# Patient Record
Sex: Female | Born: 1935 | Race: White | Hispanic: No | Smoking: Former smoker
Health system: Southern US, Community
[De-identification: ages and names within clinical notes are randomized; demographics above are authoritative.]

## PROBLEM LIST (undated history)

## (undated) DIAGNOSIS — I82409 Acute embolism and thrombosis of unspecified deep veins of unspecified lower extremity: Secondary | ICD-10-CM

## (undated) DIAGNOSIS — I10 Essential (primary) hypertension: Secondary | ICD-10-CM

## (undated) DIAGNOSIS — K449 Diaphragmatic hernia without obstruction or gangrene: Secondary | ICD-10-CM

## (undated) DIAGNOSIS — Z9981 Dependence on supplemental oxygen: Secondary | ICD-10-CM

## (undated) DIAGNOSIS — J849 Interstitial pulmonary disease, unspecified: Secondary | ICD-10-CM

## (undated) DIAGNOSIS — N813 Complete uterovaginal prolapse: Secondary | ICD-10-CM

## (undated) DIAGNOSIS — J45909 Unspecified asthma, uncomplicated: Secondary | ICD-10-CM

## (undated) DIAGNOSIS — R0600 Dyspnea, unspecified: Secondary | ICD-10-CM

## (undated) DIAGNOSIS — C801 Malignant (primary) neoplasm, unspecified: Secondary | ICD-10-CM

## (undated) DIAGNOSIS — K2289 Other specified disease of esophagus: Secondary | ICD-10-CM

## (undated) DIAGNOSIS — J449 Chronic obstructive pulmonary disease, unspecified: Secondary | ICD-10-CM

## (undated) DIAGNOSIS — R112 Nausea with vomiting, unspecified: Secondary | ICD-10-CM

## (undated) DIAGNOSIS — G709 Myoneural disorder, unspecified: Secondary | ICD-10-CM

## (undated) DIAGNOSIS — R51 Headache: Secondary | ICD-10-CM

## (undated) DIAGNOSIS — K219 Gastro-esophageal reflux disease without esophagitis: Secondary | ICD-10-CM

## (undated) DIAGNOSIS — K228 Other specified diseases of esophagus: Secondary | ICD-10-CM

## (undated) DIAGNOSIS — Z9889 Other specified postprocedural states: Secondary | ICD-10-CM

## (undated) DIAGNOSIS — M1990 Unspecified osteoarthritis, unspecified site: Secondary | ICD-10-CM

## (undated) DIAGNOSIS — A048 Other specified bacterial intestinal infections: Secondary | ICD-10-CM

## (undated) DIAGNOSIS — G243 Spasmodic torticollis: Secondary | ICD-10-CM

## (undated) DIAGNOSIS — K224 Dyskinesia of esophagus: Secondary | ICD-10-CM

## (undated) HISTORY — DX: Other specified bacterial intestinal infections: A04.8

## (undated) HISTORY — DX: Complete uterovaginal prolapse: N81.3

## (undated) HISTORY — DX: Malignant (primary) neoplasm, unspecified: C80.1

## (undated) HISTORY — DX: Dyspnea, unspecified: R06.00

## (undated) HISTORY — DX: Dyskinesia of esophagus: K22.4

## (undated) HISTORY — PX: NECK SURGERY: SHX720

## (undated) HISTORY — DX: Interstitial pulmonary disease, unspecified: J84.9

## (undated) HISTORY — DX: Diaphragmatic hernia without obstruction or gangrene: K44.9

## (undated) HISTORY — DX: Other specified disease of esophagus: K22.89

## (undated) HISTORY — DX: Unspecified asthma, uncomplicated: J45.909

## (undated) HISTORY — PX: COLONOSCOPY: SHX174

## (undated) HISTORY — DX: Other specified diseases of esophagus: K22.8

## (undated) HISTORY — PX: ESOPHAGOGASTRODUODENOSCOPY: SHX1529

---

## 1998-10-13 ENCOUNTER — Other Ambulatory Visit: Admission: RE | Admit: 1998-10-13 | Discharge: 1998-10-13 | Payer: Self-pay | Admitting: Cardiology

## 2000-09-26 ENCOUNTER — Other Ambulatory Visit: Admission: RE | Admit: 2000-09-26 | Discharge: 2000-09-26 | Payer: Self-pay | Admitting: Internal Medicine

## 2001-03-14 ENCOUNTER — Encounter (INDEPENDENT_AMBULATORY_CARE_PROVIDER_SITE_OTHER): Payer: Self-pay | Admitting: *Deleted

## 2001-03-14 ENCOUNTER — Ambulatory Visit (HOSPITAL_BASED_OUTPATIENT_CLINIC_OR_DEPARTMENT_OTHER): Admission: RE | Admit: 2001-03-14 | Discharge: 2001-03-15 | Payer: Self-pay | Admitting: Orthopedic Surgery

## 2002-05-25 ENCOUNTER — Other Ambulatory Visit: Admission: RE | Admit: 2002-05-25 | Discharge: 2002-05-25 | Payer: Self-pay | Admitting: Obstetrics and Gynecology

## 2003-12-06 ENCOUNTER — Other Ambulatory Visit: Admission: RE | Admit: 2003-12-06 | Discharge: 2003-12-06 | Payer: Self-pay | Admitting: Obstetrics and Gynecology

## 2003-12-13 ENCOUNTER — Ambulatory Visit: Admission: RE | Admit: 2003-12-13 | Discharge: 2003-12-13 | Payer: Self-pay | Admitting: Internal Medicine

## 2004-08-25 ENCOUNTER — Emergency Department (HOSPITAL_COMMUNITY): Admission: EM | Admit: 2004-08-25 | Discharge: 2004-08-25 | Payer: Self-pay | Admitting: Emergency Medicine

## 2004-08-27 ENCOUNTER — Ambulatory Visit (HOSPITAL_BASED_OUTPATIENT_CLINIC_OR_DEPARTMENT_OTHER): Admission: RE | Admit: 2004-08-27 | Discharge: 2004-08-27 | Payer: Self-pay | Admitting: Orthopedic Surgery

## 2005-06-21 ENCOUNTER — Other Ambulatory Visit: Admission: RE | Admit: 2005-06-21 | Discharge: 2005-06-21 | Payer: Self-pay | Admitting: Obstetrics and Gynecology

## 2007-06-30 ENCOUNTER — Ambulatory Visit: Payer: Self-pay | Admitting: Internal Medicine

## 2007-07-14 ENCOUNTER — Ambulatory Visit: Payer: Self-pay | Admitting: Internal Medicine

## 2007-07-14 DIAGNOSIS — K5730 Diverticulosis of large intestine without perforation or abscess without bleeding: Secondary | ICD-10-CM

## 2007-08-01 DIAGNOSIS — R51 Headache: Secondary | ICD-10-CM | POA: Insufficient documentation

## 2007-08-01 DIAGNOSIS — I10 Essential (primary) hypertension: Secondary | ICD-10-CM

## 2007-08-01 DIAGNOSIS — J309 Allergic rhinitis, unspecified: Secondary | ICD-10-CM | POA: Insufficient documentation

## 2007-08-01 DIAGNOSIS — J209 Acute bronchitis, unspecified: Secondary | ICD-10-CM | POA: Insufficient documentation

## 2007-08-01 DIAGNOSIS — R519 Headache, unspecified: Secondary | ICD-10-CM | POA: Insufficient documentation

## 2007-08-01 DIAGNOSIS — M129 Arthropathy, unspecified: Secondary | ICD-10-CM | POA: Insufficient documentation

## 2010-12-08 NOTE — Assessment & Plan Note (Signed)
[facility]                         GASTROENTEROLOGY OFFICE NOTE   Rachel Walters, Rachel Walters                       MRN:          [id number]  DATE:06/30/2007                            DOB:          02-Jun-1926    GASTROENTEROLOGY CONSULTATION   Rachel Walters is a very nice, 77 year old, white female, who is being seen  at Dr. Vicente Males request for evaluation of constipation.  We saw her  approximately 10 years ago, but unfortunately do not have the records  available.  She says I did a colonoscopy for rectal bleeding but did not  find the source of it.  She has no family history of a direct relative  having colon cancer, but one of the aunts had colon cancer.  Her bowel  habits used to be one bowel movement every day, but in the past year or  so, especially since her husband died three years ago, she has not had  good eating habits, cut back on the amount of food she eats, and has  become more constipated.  She has taken Milk of Magnesia two tablespoons  every night with good bowel movements in the morning.  She has also  taken some over-the-counter laxatives such as Correctol.  Her stools  have been rather whitish as a result of taking Milk of Magnesia.  She  denies any rectal bleeding or abdominal pain.   MEDICATIONS:  1. Metoprolol 25 mg one p.o. daily.  2. Lisinopril 40 mg p.o. daily.  3. Amitriptyline 100 mg daily.  4. Acyclovir 800 mg daily.  5. Tramadol 50 mg two q.h.s.  6. Multiple vitamins, calcium, and Vitamin C.   PAST MEDICAL HISTORY:  1. High blood pressure.  2. Asthmatic bronchitis.  3. Arthritis.  4. Chronic allergies and sinus trouble.  5. Chronic headaches.   OPERATIONS:  Never had any surgery.   FAMILY HISTORY:  Positive for breast cancer in a sister, who died four  years ago.   SOCIAL HISTORY:  She is widowed with three children.  She is retired  from [employer].   HABITS:  She smokes one pack of cigarettes a month.  She  does not drink  alcohol.  She takes Tylenol and Excedrin for headaches.   REVIEW OF SYSTEMS:  Positive for joint pains, neck pain, shortness of  breath, and muscle cramps.   PHYSICAL EXAMINATION:  VITAL SIGNS:  Blood pressure 110/68, pulse 70,  and weight 176 pounds.  GENERAL:  She was alert, oriented, and in no distress.  LUNGS:  Clear to auscultation.  COR:  Normal S1 and normal S2.  ABDOMEN:  Soft and relaxed with a decreased muscle tone, normoactive  bowel sounds, no distention, no tympany, and no tenderness.  I could not  palpate any mass or stool.  RECTAL EXAM:  An empty rectal ampulla with a small amount of hemoccult-  negative stool.  There were no external hemorrhoids.   IMPRESSION:  A 77 year old, white female with change in her bowel habits  toward constipation, which could be explained on the basis of decreased  food intake, irregular eating habits, less  activity, and possibly some  medications.  She had a colonoscopic examination approximately 10 to 12  years ago, but we do not have the records of it.  She really does not  have any risk factors for colon cancer.  Her constipation also could be  related to symptomatic diverticulosis or possibly due to obstructing  lesion.   PLAN:  1. A colonoscopy is scheduled.  I have discussed it with the patient,      and she will go along with the routine prep.  2. A high fiber diet especially in the morning.  She eats Cheerios and      needs to increase her fiber intake in the mornings.  3. Benefiber to add into the coffee or any of her cereal on a daily      basis.  4. It is okay to continue her Milk of Magnesia two tablespoons daily.      It is not habit forming and it is a good, cheap laxative.     Hedwig Morton. Juanda Chance, MD  Electronically Signed    DMB/MedQ  DD: 06/30/2007  DT: 06/30/2007  Job #: [id number]   cc:   Larina Earthly, M.D.

## 2010-12-11 NOTE — Op Note (Signed)
[facility]  Patient:    Rachel Walters, Rachel Walters Visit Number: [id number] MRN: [id number]          Service Type: DSU Location: [facility] Attending Physician:  Susa Day Proc. Date: 03/14/01 Adm. Date:  03/14/2001   CC:         Ravi R. Felipa Eth, M.D.  Anesthesia Department   Operative Report  PREOPERATIVE DIAGNOSIS:  Painful right thumb carpometacarpal arthritis with bone-on-bone arthropathy, Eaton stage III radiographic changes, and multiple loose bodies.  POSTOPERATIVE DIAGNOSIS:  Painful right thumb carpometacarpal arthritis with bone-on-bone arthropathy, Eaton stage III radiographic changes, and multiple loose bodies.  PROCEDURES: 1. Excision of right trapezium with synovectomy and debridement of loose    bodies. 2. Soft tissue interposition arthroplasty with thumb-index metacarpal    intermetacarpal ligament reconstruction with tendon graft. 3. Palmaris longus free tendon graft suspensionplasty through second    metacarpal to extensor carpi radialis brevis to suspend thumb metacarpal.  SURGEON:  Katy Fitch. Sypher, Montez Hageman., M.D.  ASSISTANT:  Jonni Sanger, P.A.  ANESTHESIA:  Infraclavicular block, right upper extremity, supervised by the anesthesiologist, J. Claybon Jabs, M.D.  INDICATIONS:  Rachel Walters is a 77 year old retired woman who presented for evaluation and management of a very painful right thumb CMC joint.  She was noted to have significant degenerative arthritis with loose bodies, bone-on-bone arthropathy, and large marginal osteophytes.  She was not a candidate for nonoperative management.  We recommended proceeding with right trapezium excision, intermetacarpal ligament reconstruction, and tendon transfer for suspensionplasty to recreate a [facility] articulation free of pain.  Preoperatively she was advised of the surgery, aftercare, potential risks and benefits.  DESCRIPTION OF PROCEDURE:  Rachel Walters is brought  to the operating room and placed in the supine position on the operating table.  Infraclavicular block placed in the holding area by Dr. Krista Blue led to satifactory anesthesia of the right upper extremity and forequarter.  The arm was prepped with Betadine solution and sterilely draped.  Ancef 1 g was administered as an IV prophylactic antibiotic.  The arm was exsanguinated with an Esmarch bandage, and arterial tourniquet in the proximal brachium was inflated to 250 mmHg.  The procedure commenced with a curvilinear incision at the base of the thumb centered over the [facility] joint.  Subcutaneous tissues were carefully divided, taking care to identify and gently retract the radial sensory branches and to protect the lateral antebrachial cutaneous sensory branches as much as technically possible.  The interval between the extensor pollicis brevis and abductor pollicis longus dorsal slip was incised with a scalpel, and a small osteotome was used to elevate the soft tissues off the trapezium subperiosteally.  Several large loose bodies were recovered from the recess deep to the thenar muscles.  The trapezium was morcellized and removed piecemeal with a rongeur and a Therapist, nutritional.  Several other loose bodies and considerable synovitis were debrided from the recess between the index metacarpal and thumb metacarpal.  The flexor carpi radialis was carefully protected during removal of the trapezium.  Two drill holes were created, one obliquely through the base of the thumb metacarpal beginning 1 cm distal to the articular surface on the dorsal surface of the metacarpal, brought proximally to the center of the thumb metacarpal articular surface.  This was enlarged to 4 mm with sequential hand drilling.  A second drill hole was created through the base of the index metacarpal just distal to the articular facet for the thumb metacarpal with a  bone awl and enlarged to 4 mm with sequential hand  drilling.  Care was taken t position the exit point on the second metacarpal at the attachment point with the extensor carpi radialis brevis dorsally.  These were carefully debrided with a rongeur to remove all sharp edges, and the wounds were thoroughly irrigated with sterile saline to remove all bone debris.  The palmaris longus was then harvested as a free tendon graft with a Brand tendon stripper through an incision in the distal wrist flexion crease.  Care was taken to protect the median palmar cutaneous branch during dissection.  The wrist wound was then repaired with intradermal 3-0 Prolene and Steri-Strip.  The tendon graft was stripped of all of its muscle and subsequently placed in a saline-soaked sponge.  The palmaris longus tendon graft was then used to create an intermetacarpal ligament by looping it through the drill hole in the index finger up to the base of the thumb metacarpal from its articular surface to the dorsal surface. This was then woven into the abductor pollicis longus tendon and secured with three Pulvertaft weaves and multiple corner sutures of 3-0 Ethibond.  This was then appropriately tensioned, suspending the thumb off the index metacarpal.  The palmaris longus was then sutured to the extensor carpi radialis brevis dorsally, properly creating an intermetacarpal ligament.  A second portion of the palmaris longus was then brought on the dorsal surface of the index metacarpal back down through the drill holes deep to the radial artery and the index metacarpal branch.  This was subsequently brought through the base of the thumb metacarpal and woven back through the abductor pollicis longus, recreating a second sling of an intermetacarpal ligament reconstruction.  The remaining portion of the palmaris longus was then used as a suspensionplasty graft, securing it to the base of the thumb metacarpal and  bringing this back up through the drill hole in  the index metacarpal and weaving it into the extensor carpi radialis brevis, creating a third tendon slip, this slip being brought along the articular surface of the thumb metacarpal, suspending it in anatomic position.  This also contributed an abduction moment to the thumb metacarpal.  This was properly tensioned by weaving it into the extensor carpi radialis brevis dorsally and secured with corner sutures of 3-0 Ethibond.  Thereafter, range of motion of the thumb was examined.  The thumb was suspended at an anatomic height level with the base of the index metacarpal.  Full flexion to bring the thumb tip to the metacarpal head of the small finger and full radial abduction was noted.  There was not significant hyperextension at the metacarpophalangeal joint of the thumb.  The wounds were then irrigated with sterile saline and hemostasis achieved with bipolar cautery.  The wounds were repaired with intradermal 3-0 Prolene and Steri-Strips.  A compressive dressing applied with Xeroflo, sterile gauze, acrylic fluff, and a thumb spica plaster splint.  There were no apparent complications.  For aftercare, Ms. Blue will be admitted to the recovery care center for observation of her vital signs, IV prophylactic antibiotics in the form of Ancef 1 g IV q.8h. x 24 hours, and appropriate analgesics, beginning with PCA morphine. Attending Physician:  Susa Day DD:  03/14/01 TD:  03/14/01 Job: [id number]

## 2010-12-11 NOTE — Op Note (Signed)
NAMEMASEY, SCHEIBER                ACCOUNT NO.:  [id number]   MEDICAL RECORD NO.:  [id number]          PATIENT TYPE:  AMB   LOCATION:  DSC                          FACILITY:  [facility]   PHYSICIAN:  Cindee Salt, M.D.       DATE OF BIRTH:  1926/06/23   DATE OF PROCEDURE:  08/27/2004  DATE OF DISCHARGE:                                 OPERATIVE REPORT   PREOPERATIVE DIAGNOSIS:  Fracture left olecranon.   POSTOPERATIVE DIAGNOSIS:  Fracture left olecranon.   OPERATION:  Open reduction internal fixation, left olecranon.   SURGEON:  Cindee Salt, M.D.   ASSISTANTCarolyne Fiscal, RN.   ANESTHESIA:  Axillary block.   HISTORY:  The patient is a 77 year old female who suffered a fall with a  fracture of her olecranon process, left arm. She is admitted now for open  reduction internal fixation.   PROCEDURE:  The patient is brought to the operating room where an axillary  block was carried out without difficulty. She was prepped using DuraPrep,  supine position, left arm free. The limb was exsanguinated with an Esmarch  bandage.  A tourniquet placed high on the arm was inflated to 250 mmHg.  A  straight incision was made posteriorly on the elbow, carried down through  subcutaneous tissue. Bleeders were electrocauterized. A large hematoma was  immediately apparent within the joint.  There was wide separation of the  fracture fragments.  A butterfly fragment was present on the medial aspect.  The fracture was cleared of hematoma and granulation tissue. This was then  reduced.  An olecranon plate from the Acumed system was then placed.  The  fracture was then pinned with a threaded Steinmann pin to maintain reduction  of the major fragments.  The butterfly fragment was left.  The x-rays  confirmed reduction of the fracture. The plate was then affixed with the 3.5  mm screws after drilling with 2.8 mm drill bit. These were inserted distally  measured between 25 and 16 mm.  These were each inserted fixing  the plate  well maintaining reduction of the fracture. The olecranon compression screw  was then placed. This was drilled after removal of the threaded pin.  This  measured approximately 45 mm. This was inserted and firmly fixed the  fracture fragment. The butterfly fragment was then reduced, clamped and  screwed with a 2.8 mm screw. This measured approximately 28 mm.  This firmly  fixed that fracture in position. The remaining two olecranon screws were  placed. These firmly fixed the olecranon.  X-rays revealed that there was  some irritation to the radial head. The coronoid screw was noted to be  slightly radial and this was redirected in an ulnar position around the  compression pin.  X-rays confirmed that there were no screws within the  joint.  There was no further crepitation with pronation, supination, or  flexion/extension.  The wound was irrigated. The subcutaneous tissue was  closed with figure-of-eight  2-0 Vicryl and the skin with interrupted 3-0 nylon sutures. A sterile  compressive dressing and long-arm splint was applied. The  patient tolerated  the procedure well was taken to the recovery for observation in satisfactory  condition.  She is to be discharged home to return to the [facility]  [place] in one week on Percocet.      GK/MEDQ  D:  08/27/2004  T:  08/27/2004  Job:  [id number]

## 2011-04-09 ENCOUNTER — Encounter (HOSPITAL_BASED_OUTPATIENT_CLINIC_OR_DEPARTMENT_OTHER): Payer: Medicare Other | Attending: General Surgery

## 2011-04-09 DIAGNOSIS — I10 Essential (primary) hypertension: Secondary | ICD-10-CM | POA: Insufficient documentation

## 2011-04-09 DIAGNOSIS — S91009A Unspecified open wound, unspecified ankle, initial encounter: Secondary | ICD-10-CM | POA: Insufficient documentation

## 2011-04-09 DIAGNOSIS — J449 Chronic obstructive pulmonary disease, unspecified: Secondary | ICD-10-CM | POA: Insufficient documentation

## 2011-04-09 DIAGNOSIS — J4489 Other specified chronic obstructive pulmonary disease: Secondary | ICD-10-CM | POA: Insufficient documentation

## 2011-04-09 DIAGNOSIS — Z79899 Other long term (current) drug therapy: Secondary | ICD-10-CM | POA: Insufficient documentation

## 2011-04-09 DIAGNOSIS — E785 Hyperlipidemia, unspecified: Secondary | ICD-10-CM | POA: Insufficient documentation

## 2011-04-09 DIAGNOSIS — S81009A Unspecified open wound, unspecified knee, initial encounter: Secondary | ICD-10-CM | POA: Insufficient documentation

## 2011-04-09 DIAGNOSIS — X58XXXA Exposure to other specified factors, initial encounter: Secondary | ICD-10-CM | POA: Insufficient documentation

## 2011-04-09 DIAGNOSIS — M810 Age-related osteoporosis without current pathological fracture: Secondary | ICD-10-CM | POA: Insufficient documentation

## 2011-04-30 ENCOUNTER — Encounter (HOSPITAL_BASED_OUTPATIENT_CLINIC_OR_DEPARTMENT_OTHER): Payer: Medicare Other | Attending: General Surgery

## 2011-04-30 DIAGNOSIS — E785 Hyperlipidemia, unspecified: Secondary | ICD-10-CM | POA: Insufficient documentation

## 2011-04-30 DIAGNOSIS — I10 Essential (primary) hypertension: Secondary | ICD-10-CM | POA: Insufficient documentation

## 2011-04-30 DIAGNOSIS — S81009A Unspecified open wound, unspecified knee, initial encounter: Secondary | ICD-10-CM | POA: Insufficient documentation

## 2011-04-30 DIAGNOSIS — J449 Chronic obstructive pulmonary disease, unspecified: Secondary | ICD-10-CM | POA: Insufficient documentation

## 2011-04-30 DIAGNOSIS — X58XXXA Exposure to other specified factors, initial encounter: Secondary | ICD-10-CM | POA: Insufficient documentation

## 2011-04-30 DIAGNOSIS — Z79899 Other long term (current) drug therapy: Secondary | ICD-10-CM | POA: Insufficient documentation

## 2011-04-30 DIAGNOSIS — M810 Age-related osteoporosis without current pathological fracture: Secondary | ICD-10-CM | POA: Insufficient documentation

## 2011-04-30 DIAGNOSIS — J4489 Other specified chronic obstructive pulmonary disease: Secondary | ICD-10-CM | POA: Insufficient documentation

## 2011-07-05 ENCOUNTER — Inpatient Hospital Stay (HOSPITAL_COMMUNITY)
Admission: EM | Admit: 2011-07-05 | Discharge: 2011-07-09 | DRG: 206 | Disposition: A | Discharge status: Skilled Nursing Facility | Payer: Medicare Other | Attending: General Surgery | Admitting: General Surgery

## 2011-07-05 ENCOUNTER — Encounter: Payer: Self-pay | Admitting: *Deleted

## 2011-07-05 ENCOUNTER — Emergency Department (HOSPITAL_COMMUNITY): Payer: Medicare Other

## 2011-07-05 DIAGNOSIS — R51 Headache: Secondary | ICD-10-CM | POA: Diagnosis present

## 2011-07-05 DIAGNOSIS — J449 Chronic obstructive pulmonary disease, unspecified: Secondary | ICD-10-CM | POA: Diagnosis present

## 2011-07-05 DIAGNOSIS — E785 Hyperlipidemia, unspecified: Secondary | ICD-10-CM | POA: Diagnosis present

## 2011-07-05 DIAGNOSIS — I10 Essential (primary) hypertension: Secondary | ICD-10-CM | POA: Diagnosis present

## 2011-07-05 DIAGNOSIS — W19XXXA Unspecified fall, initial encounter: Secondary | ICD-10-CM | POA: Diagnosis present

## 2011-07-05 DIAGNOSIS — J309 Allergic rhinitis, unspecified: Secondary | ICD-10-CM | POA: Diagnosis present

## 2011-07-05 DIAGNOSIS — M6289 Other specified disorders of muscle: Secondary | ICD-10-CM | POA: Diagnosis present

## 2011-07-05 DIAGNOSIS — S2242XA Multiple fractures of ribs, left side, initial encounter for closed fracture: Secondary | ICD-10-CM | POA: Diagnosis present

## 2011-07-05 DIAGNOSIS — G8911 Acute pain due to trauma: Secondary | ICD-10-CM

## 2011-07-05 DIAGNOSIS — S2249XA Multiple fractures of ribs, unspecified side, initial encounter for closed fracture: Secondary | ICD-10-CM

## 2011-07-05 DIAGNOSIS — R0902 Hypoxemia: Secondary | ICD-10-CM

## 2011-07-05 DIAGNOSIS — J4489 Other specified chronic obstructive pulmonary disease: Secondary | ICD-10-CM | POA: Diagnosis present

## 2011-07-05 DIAGNOSIS — Z87891 Personal history of nicotine dependence: Secondary | ICD-10-CM

## 2011-07-05 DIAGNOSIS — S2239XA Fracture of one rib, unspecified side, initial encounter for closed fracture: Secondary | ICD-10-CM

## 2011-07-05 HISTORY — DX: Myoneural disorder, unspecified: G70.9

## 2011-07-05 HISTORY — DX: Chronic obstructive pulmonary disease, unspecified: J44.9

## 2011-07-05 HISTORY — DX: Unspecified osteoarthritis, unspecified site: M19.90

## 2011-07-05 HISTORY — DX: Headache: R51

## 2011-07-05 HISTORY — DX: Essential (primary) hypertension: I10

## 2011-07-05 MED ORDER — HYDROMORPHONE HCL PF 1 MG/ML IJ SOLN
1.0000 mg | Freq: Once | INTRAMUSCULAR | Status: AC
  Administered 2011-07-05: 1 mg via INTRAVENOUS
  Filled 2011-07-05: qty 1

## 2011-07-05 MED ORDER — OXYCODONE-ACETAMINOPHEN 5-325 MG PO TABS
2.0000 | ORAL_TABLET | Freq: Once | ORAL | Status: AC
  Administered 2011-07-05: 2 via ORAL
  Filled 2011-07-05: qty 2

## 2011-07-05 MED ORDER — ONDANSETRON HCL 4 MG/2ML IJ SOLN
4.0000 mg | Freq: Once | INTRAMUSCULAR | Status: AC
  Administered 2011-07-05: 4 mg via INTRAVENOUS
  Filled 2011-07-05: qty 2

## 2011-07-05 MED ORDER — SODIUM CHLORIDE 0.9 % IV SOLN: Freq: Once | INTRAVENOUS | Status: DC

## 2011-07-05 NOTE — ED Provider Notes (Addendum)
Medical screening examination/treatment/procedure(s) were conducted as a shared visit with non-physician practitioner(s) and myself.  I personally evaluated the patient during the encounter Fell onto left side hit chest. No loc. No sob.  + bruises and rib ttp.  + rib fxs.  Also mediastinal mass. Will control pain and send to pcp for further eval and tx.  Nicholes Stairs, MD 07/05/11 2131  I spoke with Dr. Ezzard Standing.  He will come and evaluate the patient for rib fractures and the saturations.  With ambulation  Nicholes Stairs, MD 07/05/11 [id number]

## 2011-07-05 NOTE — ED Provider Notes (Signed)
History     CSN: [id number] Arrival date & time: 07/05/2011  6:29 PM   First MD Initiated Contact with Patient 07/05/11 2008      Chief Complaint  Patient presents with  . Fall    (Consider location/radiation/quality/duration/timing/severity/associated sxs/prior treatment) HPI Comments: Is as [place], states she was dressing this morning, when she lost her balance and fell, hitting her left posterior ribs against the corner of the dresser.  She has tried some over-the-counter Tylenol without relief.  Now has significant bruising pain with exertion, movement, deep breaths  Patient is a 77 y.o. female presenting with fall. The history is provided by the patient.  Fall She fell from a height of 1 to 2 ft. Impact surface: Hit left ribs."  Her dresser. Point of impact: Left RIBS. Pain location: Left RIBS. The pain is at a severity of 6/10. The pain is moderate. Pertinent negatives include no nausea. The symptoms are aggravated by activity. She has tried NSAIDs for the symptoms. The treatment provided no relief.    Past Medical History  Diagnosis Date  . Arthritis   . COPD (chronic obstructive pulmonary disease)   . Hypertension     Past Surgical History  Procedure Date  . Neck surgery 1992 and 1993    History reviewed. No pertinent family history.  History  Substance Use Topics  . Smoking status: Former Smoker    Quit date: 07/04/2008  . Smokeless tobacco: Not on file  . Alcohol Use: No    OB History    Grav Para Term Preterm Abortions TAB SAB Ect Mult Living                  Review of Systems  HENT: Negative.   Eyes: Negative.   Gastrointestinal: Negative for nausea and anal bleeding.  Genitourinary: Negative for dysuria, urgency and frequency.  Musculoskeletal: Negative for back pain and arthralgias.  Skin: Negative.   Neurological: Negative.   Hematological: Negative.   Psychiatric/Behavioral: Negative.     Allergies  Sulfa antibiotics  Home Medications     Current Outpatient Rx  Name Route Sig Dispense Refill  . AMLODIPINE BESYLATE 2.5 MG PO TABS Oral Take by mouth daily.      Marland Kitchen CALCIUM CARBONATE-VITAMIN D 600-200 MG-UNIT PO TABS Oral Take 1 tablet by mouth daily.      Marland Kitchen VITAMIN D 1000 UNITS PO TABS Oral Take 1,000 Units by mouth daily.      Marland Kitchen CLORAZEPATE DIPOTASSIUM PO Oral Take 1 tablet by mouth at bedtime.      Marland Kitchen LISINOPRIL 40 MG PO TABS Oral Take 40 mg by mouth daily.      Marland Kitchen METOPROLOL TARTRATE 50 MG PO TABS Oral Take 50 mg by mouth 2 (two) times daily.      . CENTRUM SILVER ULTRA WOMENS PO TABS Oral Take 1 tablet by mouth daily.      Marland Kitchen PRAVASTATIN SODIUM 10 MG PO TABS Oral Take 10 mg by mouth daily.      . TRAMADOL HCL 50 MG PO TABS Oral Take 50 mg by mouth every 6 (six) hours as needed. Pain.Marland KitchenMarland KitchenMarland KitchenMaximum dose= 8 tablets per day     . VITAMIN E 100 UNITS PO CAPS Oral Take 100 Units by mouth daily.        BP 160/85  Pulse 108  Temp(Src) 98.1 F (36.7 C) (Oral)  Resp 20  SpO2 96%  Physical Exam  Constitutional: She is oriented to person, place, and time. She appears  well-developed and well-nourished.  HENT:  Head: Normocephalic.  Eyes: Pupils are equal, round, and reactive to light.  Neck: Normal range of motion.  Cardiovascular: Normal rate.   Pulmonary/Chest: Effort normal.  Abdominal: Soft.  Musculoskeletal:       Posterior left bruising from the level of the seventh rib to the base of the rib cage.  Marylene Land formation starting mid axillary line toward spine  Neurological: She is oriented to person, place, and time.  Skin: Skin is warm.  Psychiatric: She has a normal mood and affect.    ED Course  Procedures (including critical care time)  Labs Reviewed - No data to display No results found.   No diagnosis found.    MDM  Chest wall contusion versus fracture of ribs versus pulmonary contusion  Tried oral pain control for this patient with 2 known left posterior rib fractures.  #7 and 10.  This is not adequate  ambulate.  The patient with desaturation to 84%, assist back to bed and oxygen started.  Will consult surgery for admission       Arman Filter, NP 07/05/11 2319

## 2011-07-05 NOTE — H&P (Addendum)
Re:   Rachel Walters DOB:   February 04, 1926 MRN:   [id number]  ASSESSMENT AND PLAN: 1.  Left 7th and 10th fractured ribs secondary to fall.  Desaturating in [facility] ER to low 80's without O2.  Will admit to trauma service- step down unit.  Follow up CXR and pulm toilet.     Discussed with Dr. Lindie Spruce.  Discussed with Dr. Waynard Edwards who is covering for Dr. Felipa Eth.  2.  COPD.  Old smoking history. 3.  Hypertension. 4.  Hypercholesterolemia. 5.  Questionable upper mediastinal enlargement.  Question to whether this needs further imaging. 6.  Dystonia left neck.  Chief Complaint  Patient presents with  . Fall   REFERRING PHYSICIAN:  Dr. Charlyne Mom, [facility].  HISTORY OF PRESENT ILLNESS: Rachel Walters is a 77 y.o. (DOB: October 11, 1925)  white female whose primary care physician is Hoyle Sauer, MD, MD and came to [facility] for broken ribs on left and desaturation.  I was called by Dr. Ashley Mariner for this lady.  The patient has COPD.  She quit smoking about 3 years ago. She has a hx of breaking left ribs in 2006 when she wrestled with a garbage can.  Today she fell while trying to put on her pants and she landed on a wooden table.  She said she had to lay still for about 1 hour to even get up.  When her daughter in law tried to bring her to the hospital, she was in too much pain, so they called the ambulance.  She did not hit her head, she did not loose consciousness, she did not hurt any other part of her body.   Past Medical History  Diagnosis Date  . Arthritis   . COPD (chronic obstructive pulmonary disease)   . Hypertension       Past Surgical History  Procedure Date  . Neck surgery 1992 and 1993      Current Facility-Administered Medications  Medication Dose Route Frequency Provider Last Rate Last Dose  . 0.9 %  sodium chloride infusion   Intravenous Once Arman Filter, NP      . HYDROmorphone (DILAUDID) injection 1 mg  1 mg Intravenous Once Arman Filter, NP   1 mg at 07/05/11 2338  .  ondansetron (ZOFRAN) injection 4 mg  4 mg Intravenous Once Arman Filter, NP   4 mg at 07/05/11 2338  . oxyCODONE-acetaminophen (PERCOCET) 5-325 MG per tablet 2 tablet  2 tablet Oral Once Arman Filter, NP   2 tablet at 07/05/11 2028   Current Outpatient Prescriptions  Medication Sig Dispense Refill  . amLODipine (NORVASC) 2.5 MG tablet Take by mouth daily.        . Calcium Carbonate-Vitamin D (CALCIUM 600+D) 600-200 MG-UNIT TABS Take 1 tablet by mouth daily.        . cholecalciferol (VITAMIN D) 1000 UNITS tablet Take 1,000 Units by mouth daily.        Marland Kitchen CLORAZEPATE DIPOTASSIUM PO Take 1 tablet by mouth at bedtime.        Marland Kitchen lisinopril (PRINIVIL,ZESTRIL) 40 MG tablet Take 40 mg by mouth daily.        . metoprolol (LOPRESSOR) 50 MG tablet Take 50 mg by mouth 2 (two) times daily.        . Multiple Vitamins-Minerals (CENTRUM SILVER ULTRA WOMENS) TABS Take 1 tablet by mouth daily.        . pravastatin (PRAVACHOL) 10 MG tablet Take 10 mg by mouth daily.        Marland Kitchen  traMADol (ULTRAM) 50 MG tablet Take 50 mg by mouth every 6 (six) hours as needed. Pain.Marland KitchenMarland KitchenMarland KitchenMaximum dose= 8 tablets per day       . vitamin E 100 UNIT capsule Take 100 Units by mouth daily.            Allergies  Allergen Reactions  . Sulfa Antibiotics Other (See Comments)    Skin turned yellow    REVIEW OF SYSTEMS: Skin:  No history of rash.  No history of abnormal moles. Infection:  No history of hepatitis or HIV.  No history of MRSA. Neurologic:  No history of stroke.  No history of seizure.  No history of headaches. Cardiac:  Hypertension x 5 years.  She saw Dr. Patty Sermons in 12-13-2006 for a card eval.  The exam was neg and she is not followed by him. Pulmonary:  Quit smoking in 2007/12/13.  Has COPD.  Had one prior fall where she broke ribs on the left.  Endocrine:  No diabetes. No thyroid disease. Gastrointestinal:  No history of stomach disease.  No history of liver disease.  No history of gall bladder disease.  No history of pancreas  disease.  No history of colon disease.  Colonoscopy by Dr. Juanda Chance in 12-12-08. Urologic:  No history of kidney stones.  No history of bladder infections. Musculoskeletal:  Broke left arm in the past. Hematologic:  No bleeding disorder.  No history of anemia.  Not anticoagulated. Psycho-social:  The patient is oriented.   The patient has no obvious psychologic or social impairment to understanding our conversation and plan.  SOCIAL and FAMILY HISTORY: Husband died in Dec 12, 2004. She has 3 children.  The closest lives in [place].  PHYSICAL EXAM: BP 160/85  Pulse 101  Temp(Src) 98.1 F (36.7 C) (Oral)  Resp 20  SpO2 95%  General: WN older WF who is alert and generally healthy appearing. Her main complaint is left chest pain. HEENT: Normal. Pupils equal. Teeth okay. Neck: Supple. No mass. No tenderness.  Scar left neck she says is from neck surgery by Dr. Newell Coral in 12/12/1993.  She says she has dystonia in her neck and goes to [facility] every 4 months for botox injection in the neck. Lymph Nodes:  No supraclavicular or cervical nodes. Lungs: Clear to auscultation.  Modest inspiratory effort.  Chest:  She has a bruise over her left posterior back. Heart:  RRR. No murmur or rub. Abdomen: Soft. No mass. No tenderness. No hernia. Normal bowel sounds.  No abdominal scars. Pelvis is stable. Rectal: Not done. Extremities:  Good strength and ROM  in upper and lower extremities.  She has pigmentation of both LE secondary to venous stasis (?).  No obvious trauma. Neurologic:  Grossly intact to motor and sensory function. Psychiatric: Has normal mood and affect. Behavior is normal.   DATA REVIEWED: Rib detail films which show left rib fxes 7 and 10. Blood work is pending at the time of this note.  Ovidio Kin, MD,  [facility] [address] [place]  [address]   [place], [place]    [address] Phone:  [phone] FAX:  [phone]

## 2011-07-05 NOTE — ED Notes (Signed)
Pt. Was putting on pants and fell and hit night stand. Not on any blood thinners.

## 2011-07-06 ENCOUNTER — Encounter (HOSPITAL_COMMUNITY): Payer: Self-pay | Admitting: *Deleted

## 2011-07-06 ENCOUNTER — Emergency Department (HOSPITAL_COMMUNITY): Payer: Medicare Other

## 2011-07-06 LAB — DIFFERENTIAL
Basophils Absolute: 0 10*3/uL (ref 0.0–0.1)
Basophils Relative: 0 % (ref 0–1)
Eosinophils Absolute: 0 10*3/uL (ref 0.0–0.7)
Eosinophils Relative: 0 % (ref 0–5)
Lymphs Abs: 1.1 10*3/uL (ref 0.7–4.0)
Neutrophils Relative %: 83 % — ABNORMAL HIGH (ref 43–77)

## 2011-07-06 LAB — CBC
MCH: 32.5 pg (ref 26.0–34.0)
MCV: 93.7 fL (ref 78.0–100.0)
Platelets: 225 10*3/uL (ref 150–400)
RBC: 4.31 MIL/uL (ref 3.87–5.11)
RDW: 11.8 % (ref 11.5–15.5)

## 2011-07-06 LAB — POCT I-STAT, CHEM 8
Calcium, Ion: 1.12 mmol/L (ref 1.12–1.32)
Chloride: 104 mEq/L (ref 96–112)
Glucose, Bld: 95 mg/dL (ref 70–99)
HCT: 42 % (ref 36.0–46.0)
Hemoglobin: 14.3 g/dL (ref 12.0–15.0)
TCO2: 25 mmol/L (ref 0–100)

## 2011-07-06 LAB — PROTIME-INR
INR: 1.01 (ref 0.00–1.49)
Prothrombin Time: 13.5 seconds (ref 11.6–15.2)

## 2011-07-06 MED ORDER — MORPHINE SULFATE 2 MG/ML IJ SOLN
1.0000 mg | INTRAMUSCULAR | Status: DC | PRN
  Administered 2011-07-07: 1 mg via INTRAVENOUS
  Administered 2011-07-07: 2 mg via INTRAVENOUS
  Filled 2011-07-06 (×3): qty 1

## 2011-07-06 MED ORDER — ONDANSETRON HCL 4 MG/2ML IJ SOLN
4.0000 mg | Freq: Four times a day (QID) | INTRAMUSCULAR | Status: DC | PRN
  Administered 2011-07-06 – 2011-07-07 (×4): 4 mg via INTRAVENOUS
  Filled 2011-07-06 (×4): qty 2

## 2011-07-06 MED ORDER — HYDROCODONE-ACETAMINOPHEN 5-325 MG PO TABS
1.0000 | ORAL_TABLET | ORAL | Status: DC | PRN
  Administered 2011-07-06 (×2): 2 via ORAL
  Filled 2011-07-06 (×2): qty 2

## 2011-07-06 MED ORDER — SIMVASTATIN 10 MG PO TABS
10.0000 mg | ORAL_TABLET | Freq: Every day | ORAL | Status: DC
  Administered 2011-07-07 – 2011-07-08 (×2): 10 mg via ORAL
  Filled 2011-07-06 (×3): qty 1

## 2011-07-06 MED ORDER — ACETAMINOPHEN 325 MG PO TABS
650.0000 mg | ORAL_TABLET | Freq: Four times a day (QID) | ORAL | Status: DC | PRN
  Filled 2011-07-06: qty 2

## 2011-07-06 MED ORDER — OXYCODONE HCL 5 MG PO TABS
5.0000 mg | ORAL_TABLET | ORAL | Status: DC | PRN
  Administered 2011-07-06 – 2011-07-08 (×9): 10 mg via ORAL
  Filled 2011-07-06 (×9): qty 2

## 2011-07-06 MED ORDER — METOPROLOL TARTRATE 50 MG PO TABS
50.0000 mg | ORAL_TABLET | Freq: Two times a day (BID) | ORAL | Status: DC
  Administered 2011-07-06 – 2011-07-09 (×8): 50 mg via ORAL
  Filled 2011-07-06 (×3): qty 1
  Filled 2011-07-06: qty 2
  Filled 2011-07-06 (×5): qty 1

## 2011-07-06 MED ORDER — LISINOPRIL 40 MG PO TABS
40.0000 mg | ORAL_TABLET | Freq: Every day | ORAL | Status: DC
  Administered 2011-07-06 – 2011-07-09 (×4): 40 mg via ORAL
  Filled 2011-07-06 (×5): qty 1

## 2011-07-06 MED ORDER — POTASSIUM CHLORIDE IN NACL 20-0.45 MEQ/L-% IV SOLN
INTRAVENOUS | Status: DC
  Administered 2011-07-06 – 2011-07-07 (×3): via INTRAVENOUS
  Filled 2011-07-06 (×3): qty 1000

## 2011-07-06 MED ORDER — CLORAZEPATE DIPOTASSIUM 3.75 MG PO TABS
3.7500 mg | ORAL_TABLET | Freq: Every day | ORAL | Status: DC
  Administered 2011-07-07 – 2011-07-08 (×3): 3.75 mg via ORAL
  Filled 2011-07-06 (×4): qty 1

## 2011-07-06 MED ORDER — ACETAMINOPHEN 650 MG RE SUPP: 650.0000 mg | Freq: Four times a day (QID) | RECTAL | Status: DC | PRN

## 2011-07-06 MED ORDER — AMLODIPINE BESYLATE 2.5 MG PO TABS
2.5000 mg | ORAL_TABLET | Freq: Every day | ORAL | Status: DC
  Administered 2011-07-06 – 2011-07-09 (×4): 2.5 mg via ORAL
  Filled 2011-07-06 (×5): qty 1

## 2011-07-06 MED ORDER — HEPARIN SODIUM (PORCINE) 5000 UNIT/ML IJ SOLN
5000.0000 [IU] | Freq: Three times a day (TID) | INTRAMUSCULAR | Status: DC
Start: 2011-07-06 — End: 2011-07-08
  Administered 2011-07-06 – 2011-07-08 (×7): 5000 [IU] via SUBCUTANEOUS
  Filled 2011-07-06 (×10): qty 1

## 2011-07-06 NOTE — Progress Notes (Signed)
Subjective: Patient frustrated that she is here in the hospital and that she fell, states that the pain is fairly well-controlled with current medications however she is trying to improve her breathing capacity with incentive spirometry. Has some nausea but no vomiting, she traverses to the pain medications. Has not moved her bowels however has only been here less than 24 hours and denies any significant caloric intake. Positive for flatus. Breathing appears to be better.  Objective: Vital signs in last 24 hours: Temp:  [97.8 F (36.6 C)-99 F (37.2 C)] 97.8 F (36.6 C) (12/11 1600) Pulse Rate:  [81-109] 82  (12/11 1600) Resp:  [17-25] 17  (12/11 1600) BP: (122-160)/(63-85) 153/84 mmHg (12/11 1600) SpO2:  [84 %-100 %] 97 % (12/11 1600) FiO2 (%):  [2 %] 2 % (12/11 0800) Weight:  [61 kg (134 lb 7.7 oz)] 134 lb 7.7 oz (61 kg) (12/11 0415) Weight change:  Last BM Date: 06/30/11  CBG (last 3)  No results found for this basename: GLUCAP:3 in the last 72 hours  Intake/Output from previous day: 12/10 0701 - 12/11 0700 In: 220 [P.O.:120; I.V.:100] Out: -  Intake/Output this shift:    General appearance: alert, cooperative and no distress Eyes: no scleral icterus Throat: oropharynx moist without erythema Resp: clear to auscultation bilaterally and No respiratory distress, tender to palpation over to palpation over posterior left side the bronchospasm or wheeze  Cardio: regular rate and rhythm, S1, S2 normal, no murmur, click, rub or gallop Extremities: no clubbing, cyanosis or edema   Lab Results:  Basename 07/06/11 0013  NA 139  K 4.0  CL 104  CO2 --  GLUCOSE 95  BUN 15  CREATININE 0.90  CALCIUM --  MG --  PHOS --   No results found for this basename: AST:2,ALT:2,ALKPHOS:2,BILITOT:2,PROT:2,ALBUMIN:2 in the last 72 hours  Basename 07/06/11 0013 07/06/11  WBC -- 11.9*  NEUTROABS -- 9.9*  HGB 14.3 14.0  HCT 42.0 40.4  MCV -- 93.7  PLT -- 225   No results found for  this basename: CKTOTAL:3,CKMB:3,CKMBINDEX:3,TROPONINI:3 in the last 72 hours No results found for this basename: TSH,T4TOTAL,FREET3,T3FREE,THYROIDAB in the last 72 hours No results found for this basename: VITAMINB12:2,FOLATE:2,FERRITIN:2,TIBC:2,IRON:2,RETICCTPCT:2 in the last 72 hours  Studies/Results: X-ray Chest Pa And Lateral  07/06/2011  *RADIOLOGY REPORT*  Clinical Data: Left rib fractures  CHEST - 2 VIEW  Comparison: 07/05/2011  Findings: Shallow inspiration.  Normal heart size and pulmonary vascularity.  Linear fibrosis or atelectasis again demonstrated in the right lung base.  Emphysematous changes with scattered fibrosis in both lungs.  Soft tissue prominence in the right paratracheal region again visualized.  Left rib fractures again demonstrated. No definite pneumothorax.  Allowing for technical differences, stable appearance of the chest.  IMPRESSION: Stable appearance of the chest since previous study.  Original Report Authenticated By: Marlon Pel, M.D.   Dg Ribs Unilateral W/chest Left  07/05/2011  *RADIOLOGY REPORT*  Clinical Data: Larey Seat today.  Left mid posterior rib pain and bruising.  LEFT RIBS AND CHEST - 3+ VIEW  Comparison: None.  Findings: Normal heart size and pulmonary vascularity.  The patient is rotated, limiting the technical quality of the exam.  There appears however to be prominent masslike density in the upper mediastinum involving the right paratracheal and left aortopulmonic window regions.  Mediastinal lymphadenopathy or mass is not excluded.  Tortuous aorta with calcification.  The lungs demonstrate emphysematous changes with diffuse fibrosis.  There is evidence of linear atelectasis or fibrosis in the right  lung base. No blunting of costophrenic angles.  Degenerative changes in the spine and shoulders.  Postoperative changes in the lower cervical spine.  Left ribs demonstrate old appearing fracture deformities of the anterior third, fourth, and fifth ribs.   Acute nondisplaced fractures are suggested in the left posterolateral 10th and 7th ribs.  IMPRESSION: Increased masslike opacity in the upper mediastinum.  Mediastinal mass or lymphadenopathy should be excluded.  Diffuse emphysematous changes and fibrosis in the lungs with linear fibrosis or atelectasis in the right lung base.  Acute nondisplaced fractures in the left seventh and tenth ribs.  Additional old rib fractures on the left.  Original Report Authenticated By: Marlon Pel, M.D.     Medications: Scheduled:   . amLODipine  2.5 mg Oral Daily  . heparin  5,000 Units Subcutaneous Q8H  .  HYDROmorphone (DILAUDID) injection  1 mg Intravenous Once  . lisinopril  40 mg Oral Daily  . metoprolol  50 mg Oral BID  . ondansetron  4 mg Intravenous Once  . oxyCODONE-acetaminophen  2 tablet Oral Once  . DISCONTD: sodium chloride   Intravenous Once   Continuous:   . 0.45 % NaCl with KCl 20 mEq / L 50 mL/hr at 07/06/11 1938    Assessment/Plan: Active Problems:  * No active hospital problems. *  Rib fracture-be managed by trauma team, mobilizing the patient, question transfer to regular floor, may need physical therapy as an outpatient, pain controlled, will need to watch bowel regimen Hypertension fairly well-controlled on home medications, no adverse side effects. Hyperlipidemia Would  reinitiate statin drug after discharge. COPD, no bronchospasm on exam, no respiratory distress despite rib fracture however patient is on oxygen. We'll treat conservatively. Hopefully will be up come off oxygen prior to discharge. Will check back on patient if still here on Thursday or Friday, please call if my assistance is needed.   LOS: 1 day   Jaylon Grode R 07/06/2011, 8:05 PM

## 2011-07-06 NOTE — Progress Notes (Addendum)
Patient ID: Rachel Walters, female   DOB: 1926-07-11, 77 y.o.   MRN: [id number]    Subjective: Doing fairly well, pain L posterior ribs with movement  Objective: Vital signs in last 24 hours: Temp:  [98 F (36.7 C)-99 F (37.2 C)] 98.7 F (37.1 C) (12/11 0800) Pulse Rate:  [81-109] 81  (12/11 0800) Resp:  [16-25] 25  (12/11 0800) BP: (122-160)/(63-85) 140/72 mmHg (12/11 0800) SpO2:  [84 %-100 %] 99 % (12/11 0800) FiO2 (%):  [2 %] 2 % (12/11 0800) Weight:  [61 kg (134 lb 7.7 oz)] 134 lb 7.7 oz (61 kg) (12/11 0415) Last BM Date: 06/30/11  Intake/Output from previous day: 12/10 0701 - 12/11 0700 In: 220 [P.O.:120; I.V.:100] Out: -  Intake/Output this shift:    General appearance: alert and cooperative Back: contusion over L posterior ribs Resp: clear to auscultation bilaterally Cardio: regular rate and rhythm GI: soft, NT, ND  Lab Results: CBC   Basename 07/06/11 0013 07/06/11  WBC -- 11.9*  HGB 14.3 14.0  HCT 42.0 40.4  PLT -- 225   BMET  Basename 07/06/11 0013  NA 139  K 4.0  CL 104  CO2 --  GLUCOSE 95  BUN 15  CREATININE 0.90  CALCIUM --   PT/INR  Basename 07/06/11  LABPROT 13.5  INR 1.01   ABG No results found for this basename: PHART:2,PCO2:2,PO2:2,HCO3:2 in the last 72 hours  Studies/Results: X-ray Chest Pa And Lateral  07/06/2011  *RADIOLOGY REPORT*  Clinical Data: Left rib fractures  CHEST - 2 VIEW  Comparison: 07/05/2011  Findings: Shallow inspiration.  Normal heart size and pulmonary vascularity.  Linear fibrosis or atelectasis again demonstrated in the right lung base.  Emphysematous changes with scattered fibrosis in both lungs.  Soft tissue prominence in the right paratracheal region again visualized.  Left rib fractures again demonstrated. No definite pneumothorax.  Allowing for technical differences, stable appearance of the chest.  IMPRESSION: Stable appearance of the chest since previous study.  Original Report Authenticated By: Marlon Pel, M.D.   Dg Ribs Unilateral W/chest Left  07/05/2011  *RADIOLOGY REPORT*  Clinical Data: Larey Seat today.  Left mid posterior rib pain and bruising.  LEFT RIBS AND CHEST - 3+ VIEW  Comparison: None.  Findings: Normal heart size and pulmonary vascularity.  The patient is rotated, limiting the technical quality of the exam.  There appears however to be prominent masslike density in the upper mediastinum involving the right paratracheal and left aortopulmonic window regions.  Mediastinal lymphadenopathy or mass is not excluded.  Tortuous aorta with calcification.  The lungs demonstrate emphysematous changes with diffuse fibrosis.  There is evidence of linear atelectasis or fibrosis in the right lung base. No blunting of costophrenic angles.  Degenerative changes in the spine and shoulders.  Postoperative changes in the lower cervical spine.  Left ribs demonstrate old appearing fracture deformities of the anterior third, fourth, and fifth ribs.  Acute nondisplaced fractures are suggested in the left posterolateral 10th and 7th ribs.  IMPRESSION: Increased masslike opacity in the upper mediastinum.  Mediastinal mass or lymphadenopathy should be excluded.  Diffuse emphysematous changes and fibrosis in the lungs with linear fibrosis or atelectasis in the right lung base.  Acute nondisplaced fractures in the left seventh and tenth ribs.  Additional old rib fractures on the left.  Original Report Authenticated By: Marlon Pel, M.D.    Anti-infectives: Anti-infectives    None      Assessment/Plan: Fall L rib fx 7  and 10 - pulm toilet and pain control HTN - home meds VTE - heparin PT/OT - patient lives alone Mediastinal mass - will need outpatient F/U Transfer to floor    LOS: 1 day    Samwise Eckardt E 07/06/2011

## 2011-07-06 NOTE — ED Provider Notes (Signed)
Medical screening examination/treatment/procedure(s) were conducted as a shared visit with non-physician practitioner(s) and myself.  I personally evaluated the patient during the encounter  Nicholes Stairs, MD 07/06/11 [phone]

## 2011-07-07 ENCOUNTER — Inpatient Hospital Stay (HOSPITAL_COMMUNITY): Payer: Medicare Other

## 2011-07-07 MED ORDER — IOHEXOL 300 MG/ML  SOLN
80.0000 mL | Freq: Once | INTRAMUSCULAR | Status: AC | PRN
  Administered 2011-07-07: 80 mL via INTRAVENOUS

## 2011-07-07 NOTE — Progress Notes (Signed)
This patient has been seen and I agree with the findings and treatment plan.  Dorreen Valiente O. Magnolia Mattila, III, MD, FACS [phone] (pager) [phone] (direct pager) Trauma Surgeon  

## 2011-07-07 NOTE — Progress Notes (Signed)
Patient ID: Rachel Walters, female   DOB: 1925-09-22, 77 y.o.   MRN: [id number]    Subjective: Did not do well with therapies due to c/o pain  Objective: Vital signs in last 24 hours: Temp:  [97.8 F (36.6 C)-98.7 F (37.1 C)] 98.4 F (36.9 C) (12/12 1034) Pulse Rate:  [82-99] 94  (12/12 1034) Resp:  [17-24] 18  (12/12 1034) BP: (144-183)/(68-92) 150/68 mmHg (12/12 1034) SpO2:  [88 %-100 %] 88 % (12/12 1034) Last BM Date: 07/05/11  Intake/Output from previous day: 12/11 0701 - 12/12 0700 In: 2331 [P.O.:490; I.V.:1841] Out: 1400 [Urine:1400] Intake/Output this shift:    General appearance: alert and cooperative Back: contusion over L posterior ribs and pain to palpation Resp: clear to auscultation bilaterally Cardio: regular rate and rhythm GI: soft, NT, ND  Lab Results: CBC   Basename 07/06/11 0013 07/06/11  WBC -- 11.9*  HGB 14.3 14.0  HCT 42.0 40.4  PLT -- 225   BMET  Basename 07/06/11 0013  NA 139  K 4.0  CL 104  CO2 --  GLUCOSE 95  BUN 15  CREATININE 0.90  CALCIUM --   PT/INR  Basename 07/06/11  LABPROT 13.5  INR 1.01   ABG No results found for this basename: PHART:2,PCO2:2,PO2:2,HCO3:2 in the last 72 hours  Studies/Results: X-ray Chest Pa And Lateral  07/06/2011  *RADIOLOGY REPORT*  Clinical Data: Left rib fractures  CHEST - 2 VIEW  Comparison: 07/05/2011  Findings: Shallow inspiration.  Normal heart size and pulmonary vascularity.  Linear fibrosis or atelectasis again demonstrated in the right lung base.  Emphysematous changes with scattered fibrosis in both lungs.  Soft tissue prominence in the right paratracheal region again visualized.  Left rib fractures again demonstrated. No definite pneumothorax.  Allowing for technical differences, stable appearance of the chest.  IMPRESSION: Stable appearance of the chest since previous study.  Original Report Authenticated By: Marlon Pel, M.D.   Dg Ribs Unilateral W/chest Left  07/05/2011   *RADIOLOGY REPORT*  Clinical Data: Larey Seat today.  Left mid posterior rib pain and bruising.  LEFT RIBS AND CHEST - 3+ VIEW  Comparison: None.  Findings: Normal heart size and pulmonary vascularity.  The patient is rotated, limiting the technical quality of the exam.  There appears however to be prominent masslike density in the upper mediastinum involving the right paratracheal and left aortopulmonic window regions.  Mediastinal lymphadenopathy or mass is not excluded.  Tortuous aorta with calcification.  The lungs demonstrate emphysematous changes with diffuse fibrosis.  There is evidence of linear atelectasis or fibrosis in the right lung base. No blunting of costophrenic angles.  Degenerative changes in the spine and shoulders.  Postoperative changes in the lower cervical spine.  Left ribs demonstrate old appearing fracture deformities of the anterior third, fourth, and fifth ribs.  Acute nondisplaced fractures are suggested in the left posterolateral 10th and 7th ribs.  IMPRESSION: Increased masslike opacity in the upper mediastinum.  Mediastinal mass or lymphadenopathy should be excluded.  Diffuse emphysematous changes and fibrosis in the lungs with linear fibrosis or atelectasis in the right lung base.  Acute nondisplaced fractures in the left seventh and tenth ribs.  Additional old rib fractures on the left.  Original Report Authenticated By: Marlon Pel, M.D.    Anti-infectives: Anti-infectives    None      Assessment/Plan: Fall L rib fx 7 and 10 - pulm toilet and pain control and continue therapies HTN - home meds VTE -SQ heparin  PT/OT -  patient lives alone and unlikely to get back to Independent level soon. Mediastinal mass - Will get chest CT to evaluate further DISPO- Likely to need SNF, consulted SW    LOS: 2 days    [facility] Pager [phone] General Trauma Pager [phone]

## 2011-07-07 NOTE — Progress Notes (Signed)
PT Note: PT order received however, pt declined PT this afternoon after just returning from a CT scan and having much pain with OT this morning.  PT will check back tomorrow, PT treatment cancelled today. Lyanne Co, PT  Acute Rehab Services  [phone]

## 2011-07-07 NOTE — Progress Notes (Signed)
Radiology called result of CT scan.  Results called to PA at time of call (1440) and no new orders received.  Rachel Walters

## 2011-07-07 NOTE — Progress Notes (Signed)
Clinical Social Worker completed the psychosocial assessment which can be found in the shadow chart.  Patient lives in [place] area and would prefer to stay close to home.  Patient agreeable for SNF search in [place] and [place].  CSW to initiate search and follow up with patient in regards to bed offers.  [address] [place], [place] [phone]

## 2011-07-07 NOTE — Progress Notes (Signed)
Occupational Therapy Evaluation Patient Details Name: Rachel Walters MRN: [id number] DOB: 1926-06-11 Today's Date: 07/07/2011 10:12-10:49  evII Problem List:  Patient Active Problem List  Diagnoses  . HYPERTENSION  . ASTHMATIC BRONCHITIS, ACUTE  . ALLERGIC RHINITIS, CHRONIC  . DIVERTICULOSIS, COLON  . ARTHRITIS  . HEADACHE, CHRONIC    Past Medical History:  Past Medical History  Diagnosis Date  . Arthritis   . COPD (chronic obstructive pulmonary disease)   . Hypertension   . Headache   . Neuromuscular disorder    Past Surgical History:  Past Surgical History  Procedure Date  . Neck surgery 1992 and 1993    OT Assessment/Plan/Recommendation OT Assessment Clinical Impression Statement: 77 year old female with fall and rib fractures.  Very limited OT session secondary to intense rib pain and nausea in sitting.  Only able to tolerate sitting for approximately 1 minute at EOB then had to lay back down.  Feel pt has significan t limitations in selfcare independence and toileting.  Per report she required the use of 2 nurses to pivot to the bedside commode.  Feel pt will need at least min assist if she progresses at an efficient rate for ADLs.  Recommend SNF for follow-up rehab secondary to not having any assist at d/c. OT Recommendation/Assessment: Patient will need skilled OT in the acute care venue OT Problem List: Decreased strength;Decreased activity tolerance;Impaired balance (sitting and/or standing);Decreased knowledge of use of DME or AE;Pain;Cardiopulmonary status limiting activity Barriers to Discharge: Decreased caregiver support OT Therapy Diagnosis : Generalized weakness;Acute pain OT Plan OT Frequency: Min 2X/week OT Treatment/Interventions: Self-care/ADL training;DME and/or AE instruction;Therapeutic activities;Patient/family education;Balance training OT Recommendation Follow Up Recommendations: Skilled nursing facility Equipment Recommended: Defer to next  venue Individuals Consulted Consulted and Agree with Results and Recommendations: Patient OT Goals Acute Rehab OT Goals OT Goal Formulation: With patient ADL Goals Pt Will Perform Grooming: with supervision;Standing at sink ADL Goal: Grooming - Progress: Progressing toward goals Pt Will Perform Lower Body Bathing: with supervision;Sit to stand from bed ADL Goal: Lower Body Bathing - Progress: Progressing toward goals Pt Will Perform Lower Body Dressing: with supervision;Sit to stand from bed ADL Goal: Lower Body Dressing - Progress: Progressing toward goals Pt Will Transfer to Toilet: with supervision;3-in-1 ADL Goal: Toilet Transfer - Progress: Progressing toward goals Pt Will Perform Toileting - Clothing Manipulation: with supervision;Sitting on 3-in-1 or toilet;Standing ADL Goal: Toileting - Clothing Manipulation - Progress: Progressing toward goals Pt Will Perform Toileting - Hygiene: with supervision;Sit to stand from 3-in-1/toilet ADL Goal: Toileting - Hygiene - Progress: Progressing toward goals  OT Evaluation Precautions/Restrictions  Precautions Precautions: Fall Required Braces or Orthoses: No Restrictions Weight Bearing Restrictions: No Prior Functioning Home Living Lives With: Alone Receives Help From:  (Someone comes in and cleans once a month.) Type of Home: House Home Layout: One level Home Access: Stairs to enter Entrance Stairs-Rails: Left Entrance Stairs-Number of Steps: 3 Bathroom Shower/Tub: Tub/shower unit;Other (comment) (Grab bar on the side of the tub.) Bathroom Toilet: Standard Bathroom Accessibility: Yes Home Adaptive Equipment: Grab bars in shower;Bedside commode/3-in-1;Walker - standard Prior Function Level of Independence: Independent with basic ADLs Vocation: Retired ADL ADL Eating/Feeding: Performed;Independent Where Assessed - Eating/Feeding: Bed level Grooming: Simulated;Set up Where Assessed - Grooming: Supine, head of bed up Upper  Body Bathing: Minimal assistance Where Assessed - Upper Body Bathing: Supine, head of bed up Lower Body Bathing: Moderate assistance Where Assessed - Lower Body Bathing: Supine, head of bed up Upper Body Dressing: Minimal assistance  Where Assessed - Upper Body Dressing: Supine, head of bed up Lower Body Dressing: Maximal assistance Where Assessed - Lower Body Dressing: Supine, head of bed up Toilet Transfer: Not assessed Toilet Transfer Method: Not assessed Toileting - Clothing Manipulation: Not assessed Toileting - Hygiene: Not assessed Tub/Shower Transfer: Not assessed Tub/Shower Transfer Method: Not assessed Vision/Perception  Vision - History Baseline Vision: No visual deficits Cognition Cognition Arousal/Alertness: Awake/alert Overall Cognitive Status: Appears within functional limits for tasks assessed Sensation/Coordination Sensation Light Touch: Appears Intact Extremity Assessment RUE Assessment RUE Assessment: Within Functional Limits RUE AROM (degrees) RUE Overall AROM Comments: AROM WFLs for all joints. strength not formally assessed secondary rib pain. LUE Assessment LUE Assessment: Within Functional Limits LUE AROM (degrees) LUE Overall AROM Comments: AROM shoulder flexion and strength limited secondary to rib pain.  Able to raise arm to approximately 90 degrees. Mobility  Bed Mobility Bed Mobility: Yes Right Sidelying to Sit: 2: Max assist Right Sidelying to Sit Details (indicate cue type and reason): Pt required physical assist from therapist to sit up. Exercises   End of Session OT - End of Session Activity Tolerance: Patient limited by pain Patient left: in bed General Behavior During Session: [facility] for tasks performed Cognition: [facility] for tasks performed   Alaiza Yau OTR/L 07/07/2011, 1:01 PM  Pager number [phone]

## 2011-07-08 ENCOUNTER — Inpatient Hospital Stay (HOSPITAL_COMMUNITY): Payer: Medicare Other

## 2011-07-08 DIAGNOSIS — S2239XA Fracture of one rib, unspecified side, initial encounter for closed fracture: Secondary | ICD-10-CM

## 2011-07-08 DIAGNOSIS — D381 Neoplasm of uncertain behavior of trachea, bronchus and lung: Secondary | ICD-10-CM

## 2011-07-08 LAB — BASIC METABOLIC PANEL
BUN: 13 mg/dL (ref 6–23)
Chloride: 99 mEq/L (ref 96–112)
Creatinine, Ser: 0.71 mg/dL (ref 0.50–1.10)
GFR calc non Af Amer: 78 mL/min — ABNORMAL LOW (ref >90)
Glucose, Bld: 70 mg/dL (ref 70–99)
Potassium: 4.4 mEq/L (ref 3.5–5.1)

## 2011-07-08 MED ORDER — ENOXAPARIN SODIUM 40 MG/0.4ML ~~LOC~~ SOLN
40.0000 mg | SUBCUTANEOUS | Status: DC
  Administered 2011-07-08 – 2011-07-09 (×2): 40 mg via SUBCUTANEOUS
  Filled 2011-07-08 (×2): qty 0.4

## 2011-07-08 MED ORDER — OXYCODONE HCL 5 MG PO TABS
5.0000 mg | ORAL_TABLET | ORAL | Status: DC | PRN
  Administered 2011-07-08 – 2011-07-09 (×3): 10 mg via ORAL
  Filled 2011-07-08 (×3): qty 2

## 2011-07-08 MED ORDER — DOCUSATE SODIUM 100 MG PO CAPS
100.0000 mg | ORAL_CAPSULE | Freq: Two times a day (BID) | ORAL | Status: DC
  Administered 2011-07-08 – 2011-07-09 (×3): 100 mg via ORAL
  Filled 2011-07-08 (×3): qty 1

## 2011-07-08 MED ORDER — MORPHINE SULFATE 2 MG/ML IJ SOLN: 2.0000 mg | INTRAMUSCULAR | Status: DC | PRN

## 2011-07-08 MED ORDER — ONDANSETRON HCL 4 MG/2ML IJ SOLN
4.0000 mg | INTRAMUSCULAR | Status: DC | PRN
  Administered 2011-07-08: 4 mg via INTRAVENOUS
  Filled 2011-07-08: qty 2

## 2011-07-08 MED ORDER — POLYETHYLENE GLYCOL 3350 17 G PO PACK
17.0000 g | PACK | Freq: Every day | ORAL | Status: DC
  Administered 2011-07-08 – 2011-07-09 (×2): 17 g via ORAL
  Filled 2011-07-08 (×2): qty 1

## 2011-07-08 MED ORDER — ONDANSETRON HCL 4 MG PO TABS
4.0000 mg | ORAL_TABLET | ORAL | Status: DC | PRN
  Administered 2011-07-09: 4 mg via ORAL
  Filled 2011-07-08: qty 1

## 2011-07-08 NOTE — Progress Notes (Signed)
Clinical Social Worker spoke with patient at bedside to discuss patient bed offers.  Patient looking into facilities in [place] and [place].  Patient with facility list and asked to have time to share with family.  Clinical Social Worker prepared patient to have top 3 facility choices by tomorrow am.  CSW available for support as needed.  CSW to facilitate patient discharge needs once medically stable.  [address] [place], [place] [phone]

## 2011-07-08 NOTE — Progress Notes (Signed)
This patient has been seen and I agree with the findings and treatment plan.  Difficult to keep this patient out of trouble because of her injuries and pulmonary issues  Marta Lamas. Gae Bon, MD, FACS [phone] (pager) [phone] (direct pager) Trauma Surgeon

## 2011-07-08 NOTE — Progress Notes (Signed)
Seen and agree with SPT note. Pt on 2L end of session and required encouragement and reinforcement of therapy benefits to participate.  Toney Sang, PT [phone]

## 2011-07-08 NOTE — Progress Notes (Signed)
Patient ID: Rachel Walters, female   DOB: [birth year], 77 y.o.   MRN: [id number]   LOS: 3 days   Subjective: A little nauseated after walking. Pain still significant but controlled.  Objective: Vital signs in last 24 hours: Temp:  [97.6 F (36.4 C)-98.7 F (37.1 C)] 97.6 F (36.4 C) (12/13 0500) Pulse Rate:  [92-106] 94  (12/13 0500) Resp:  [16-18] 18  (12/13 0500) BP: (148-170)/(75-91) 153/75 mmHg (12/13 0500) SpO2:  [89 %-100 %] 96 % (12/13 1050) Last BM Date: 07/05/11  IS:  BMET  Basename 07/08/11 0650 07/06/11 0013  NA 135 139  K 4.4 4.0  CL 99 104  CO2 23 --  GLUCOSE 70 95  BUN 13 15  CREATININE 0.71 0.90  CALCIUM 8.9 --   *RADIOLOGY REPORT*  Clinical Data: Trauma with left rib fractures and pneumothorax.  PORTABLE CHEST - 1 VIEW  Comparison: 07/06/2011 and chest CT dated 07/07/2011.  Findings: Stable radiographic appearance of left-sided rib  fractures. There likely is a tiny residual component of a lateral  pneumothorax at the level of rib fractures. No significant apical  component is identified. There is atelectasis of the left lower  lobe with probable small amount of left pleural fluid. Basilar  atelectasis on the right is relatively stable.  IMPRESSION:  Probable small residual component of lateral pneumothorax. Left  lower lobe atelectasis present with associated left pleural fluid.  Original Report Authenticated By: Reola Calkins, M.D.   General appearance: alert and no distress Resp: clear to auscultation bilaterally Cardio: regular rate and rhythm GI: normal findings: bowel sounds normal and soft, non-tender  Assessment/Plan: Fall  L rib fx 7 and 10 - pulm toilet and pain control and continue therapies  HTN - home meds  VTE -SQ heparin. Change to lovenox. PT/OT - patient lives alone and unlikely to get back to Independent level soon.  Mediastinal mass - Thoracic surgery to see DISPO- Likely to need SNF    Freeman Caldron,  PA-C Pager: [phone] General Trauma PA Pager: [phone]   07/08/2011

## 2011-07-08 NOTE — Consult Note (Signed)
[address]            [place] [address]          [phone]       NEZZIE MANERA [facility] Medical Record #[id number] Date of Birth: [birth year]  No ref. provider found Hoyle Sauer, MD, MD  Chief Complaint:    Chief Complaint  Patient presents with  . Fall    History of Present Illness: This 77 year old patient was admitted with a fractured ribs. I CT scan was obtained which showed a nondisplaced fracture ribs on the left as well as a small pneumothorax. She had a 5 mm right lower lobe lesion. We are asked to see regarding the 5 mm 5 right lower lobe lesion.       Current Activity/ Functional Status: Patient will be independent with mobility/ambulation, transfers, ADL's, IADL's.   Past Medical History  Diagnosis Date  . Arthritis   . COPD (chronic obstructive pulmonary disease)   . Hypertension   . Headache   . Neuromuscular disorder     Past Surgical History  Procedure Date  . Neck surgery 1992 and 1993    History  Smoking status  . Former Smoker  . Quit date: 07/04/2008  Smokeless tobacco  . Not on file    History  Alcohol Use No    History   Social History  . Marital Status: Married    Spouse Name: N/A    Number of Children: N/A  . Years of Education: N/A   Occupational History  . Not on file.   Social History Main Topics  . Smoking status: Former Smoker    Quit date: 07/04/2008  . Smokeless tobacco: Not on file  . Alcohol Use: No  . Drug Use: No  . Sexually Active: No   Other Topics Concern  . Not on file   Social History Narrative  . No narrative on file    Allergies  Allergen Reactions  . Sulfa Antibiotics Other (See Comments)    Skin turned yellow    Current Facility-Administered Medications  Medication Dose Route Frequency Provider Last Rate Last Dose  . acetaminophen (TYLENOL) tablet 650 mg  650 mg Oral Q6H PRN Kandis Cocking, MD       Or  . acetaminophen (TYLENOL)  suppository 650 mg  650 mg Rectal Q6H PRN Kandis Cocking, MD      . amLODipine (NORVASC) tablet 2.5 mg  2.5 mg Oral Daily Kandis Cocking, MD   2.5 mg at 07/08/11 1004  . clorazepate (TRANXENE) tablet 3.75 mg  3.75 mg Oral QHS Liz Malady, MD   3.75 mg at 07/07/11 2134  . docusate sodium (COLACE) capsule 100 mg  100 mg Oral BID Freeman Caldron, PA      . enoxaparin (LOVENOX) injection 40 mg  40 mg Subcutaneous Q24H Freeman Caldron, PA      . iohexol (OMNIPAQUE) 300 MG/ML solution 80 mL  80 mL Intravenous Once PRN Medication Radiologist   80 mL at 07/07/11 1415  . lisinopril (PRINIVIL,ZESTRIL) tablet 40 mg  40 mg Oral Daily Kandis Cocking, MD   40 mg at 07/08/11 1004  . metoprolol tartrate (LOPRESSOR) tablet 50 mg  50 mg Oral BID Kandis Cocking, MD   50 mg at 07/08/11 1004  . morphine 2 MG/ML injection 2 mg  2 mg  Intravenous Q4H PRN Freeman Caldron, PA      . ondansetron [facility]) tablet 4 mg  4 mg Oral Q4H PRN Freeman Caldron, PA       Or  . ondansetron [facility]) injection 4 mg  4 mg Intravenous Q4H PRN Freeman Caldron, PA      . oxyCODONE (Oxy IR/ROXICODONE) immediate release tablet 5-15 mg  5-15 mg Oral Q4H PRN Freeman Caldron, PA      . polyethylene glycol (MIRALAX / GLYCOLAX) packet 17 g  17 g Oral Daily Freeman Caldron, PA      . simvastatin (ZOCOR) tablet 10 mg  10 mg Oral q1800 Liz Malady, MD   10 mg at 07/07/11 1748  . DISCONTD: 0.45 % NaCl with KCl 20 mEq / L infusion   Intravenous Continuous Shawn Rayburn, PA 20 mL/hr at 07/07/11 1430    . DISCONTD: heparin injection 5,000 Units  5,000 Units Subcutaneous Q8H Kandis Cocking, MD   5,000 Units at 07/08/11 0600  . DISCONTD: morphine 2 MG/ML injection 1-2 mg  1-2 mg Intravenous Q2H PRN Kandis Cocking, MD   2 mg at 07/07/11 1430  . DISCONTD: ondansetron (ZOFRAN) injection 4 mg  4 mg Intravenous Q6H PRN Kandis Cocking, MD   4 mg at 07/07/11 2206  . DISCONTD: oxyCODONE (Oxy IR/ROXICODONE) immediate release tablet  5-10 mg  5-10 mg Oral Q4H PRN Liz Malady, MD   10 mg at 07/08/11 1004     History reviewed. No pertinent family history.   Review of Systems:   Pertinent items are noted in HPI.  Physical Exam: BP 153/75  Pulse 94  Temp(Src) 97.6 F (36.4 C) (Oral)  Resp 18  Ht 5\' 5"  (1.651 m)  Wt 61 kg (134 lb 7.7 oz)  BMI 22.38 kg/m2  SpO2 96%  General appearance: alert Neurologic: intact Heart: regular rate and rhythm, S1, S2 normal, no murmur, click, rub or gallop Lungs: diminished breath sounds LLL Abdomen: soft, non-tender; bowel sounds normal; no masses,  no organomegaly Extremities: extremities normal, atraumatic, no cyanosis or edema   Diagnostic Studies & Laboratory data:     Recent Radiology Findings:   Ct Chest W Contrast  07/07/2011  *RADIOLOGY REPORT*  Clinical Data: Mediastinal mass.  Recent fall with left rib fractures and pain.  CT CHEST WITH CONTRAST  Technique:  Multidetector CT imaging of the chest was performed following the standard protocol during bolus administration of intravenous contrast.  Contrast: 80mL OMNIPAQUE IOHEXOL 300 MG/ML IV SOLN  Comparison: Chest radiograph 07/05/2009 and 07/05/2011.  Findings: Mediastinal lymph nodes measure up to 11 mm in the low left paratracheal station.  There may be calcified subcarinal lymph nodes.  No hilar or axillary adenopathy.  Atherosclerotic calcification of the arterial vasculature, including coronary arteries.  Heart size normal.  No pericardial effusion.  Small bilateral pleural effusions and bilateral lower lobe collapse/consolidation, left greater than right.  Small anterior left pneumothorax.  Airway is unremarkable.  Incidental imaging of the upper abdomen shows no acute findings. No worrisome lytic or sclerotic lesions.  There are nondisplaced left rib fractures of subcutaneous emphysema on the left flank., Schmorl's node is seen in the lower thoracic vertebral body superior endplate.  IMPRESSION:  1.  Nondisplaced  left rib fractures with associated small left hydropneumothorax. Critical Value/emergent results were called by telephone at the time of interpretation on 07/07/2011  at 1440 hours  to  [place], [place] who verbally acknowledged these results.  2.  Collapse/consolidation in the left lower lobe.  Tiny right pleural effusion with right lower lobe atelectasis. 3.  Possible 5 mm nodular density in the right lower lobe.  If the patient is at high risk for bronchogenic carcinoma, follow-up chest CT at 6-12 months is recommended.  If the patient is at low risk for bronchogenic carcinoma, follow-up chest CT at 12 months is recommended.  This recommendation follows the consensus statement: Guidelines for Management of Small Pulmonary Nodules Detected on CT Scans: A Statement from the Fleischner Society as published in Radiology 2005; 237:395-400. Online at: DietDisorder.cz. 4.  Borderline enlarged mediastinal lymph nodes may be reactive. 5.  Coronary artery calcification.  Original Report Authenticated By: Reyes Ivan, M.D.   Dg Chest Port 1 View  07/08/2011  *RADIOLOGY REPORT*  Clinical Data: Trauma with left rib fractures and pneumothorax.  PORTABLE CHEST - 1 VIEW  Comparison: 07/06/2011 and chest CT dated 07/07/2011.  Findings: Stable radiographic appearance of left-sided rib fractures.  There likely is a tiny residual component of a lateral pneumothorax at the level of rib fractures.  No significant apical component is identified.  There is atelectasis of the left lower lobe with probable small amount of left pleural fluid.  Basilar atelectasis on the right is relatively stable.  IMPRESSION: Probable small residual component of lateral pneumothorax.  Left lower lobe atelectasis present with associated left pleural fluid.  Original Report Authenticated By: Reola Calkins, M.D.      Recent Lab Findings: Lab Results  Component Value Date   WBC 11.9* 07/06/2011   HGB  14.3 07/06/2011   HCT 42.0 07/06/2011   PLT 225 07/06/2011   GLUCOSE 70 07/08/2011   NA 135 07/08/2011   K 4.4 07/08/2011   CL 99 07/08/2011   CREATININE 0.71 07/08/2011   BUN 13 07/08/2011   CO2 23 07/08/2011   INR 1.01 07/06/2011      Assessment / Plan:  With a nodule is 5 mm or less and according into the national cancer screening guidelines, nodules or 4-6 mm in size need to be follow with a CT scan in 3 months and if staple 6 months and possibly 12 months. We will arrange a followup after patient is discharged. We will continue to follow should she develop any other pulmonary problems that require operative intervention.          Corinthia Helmers PATRICK 07/08/2011 11:26 AM

## 2011-07-08 NOTE — Progress Notes (Signed)
Physical Therapy Evaluation Patient Details Name: Rachel Walters MRN: [id number] DOB: March 05, 1926 Today's Date: 07/08/2011  Problem List:  Patient Active Problem List  Diagnoses  . HYPERTENSION  . ASTHMATIC BRONCHITIS, ACUTE  . ALLERGIC RHINITIS, CHRONIC  . DIVERTICULOSIS, COLON  . ARTHRITIS  . HEADACHE, CHRONIC    Past Medical History:  Past Medical History  Diagnosis Date  . Arthritis   . COPD (chronic obstructive pulmonary disease)   . Hypertension   . Headache   . Neuromuscular disorder    Past Surgical History:  Past Surgical History  Procedure Date  . Neck surgery 1992 and 1993    PT Assessment/Plan/Recommendation PT Assessment Clinical Impression Statement: Pt. with decreased mobility secondary to left rib fractures with pain rated at 8/10.  Pt. required increased time and assistance for bed mobility and transfers, and used RW for stability during ambulation.  Able to walk 45 feet with min guard assist on 3L 02, as O2 dropped to 89% in sitting on room air.  O2 at 96% on 3L at completion of session.  Encouraged pt. to get up with nursing to use the restroom to increase activity tolerance.  Discussed recommendation for SNF at discharge, to which pt. is agreeable.  PT Recommendation/Assessment: Patient will need skilled PT in the acute care venue PT Problem List: Decreased activity tolerance;Decreased mobility;Decreased knowledge of use of DME;Pain;Decreased strength Barriers to Discharge: None PT Therapy Diagnosis : Difficulty walking;Acute pain PT Plan PT Frequency: Min 3X/week PT Treatment/Interventions: DME instruction;Gait training;Functional mobility training;Therapeutic activities;Patient/family education;Therapeutic exercise PT Recommendation Follow Up Recommendations: Skilled nursing facility Equipment Recommended: Defer to next venue PT Goals  Acute Rehab PT Goals PT Goal Formulation: With patient Time For Goal Achievement: 7 days Pt will Roll Supine to  Right Side: Independently PT Goal: Rolling Supine to Right Side - Progress: Other (comment) Pt will go Supine/Side to Sit: with modified independence;with HOB 0 degrees;with rail PT Goal: Supine/Side to Sit - Progress: Other (comment) Pt will go Sit to Supine/Side: with modified independence;with rail;with HOB 0 degrees PT Goal: Sit to Supine/Side - Progress: Other (comment) Pt will go Sit to Stand: with supervision PT Goal: Sit to Stand - Progress: Other (comment) Pt will go Stand to Sit: with supervision PT Goal: Stand to Sit - Progress: Other (comment) Pt will Ambulate: 51 - 150 feet;with supervision;with least restrictive assistive device PT Goal: Ambulate - Progress: Other (comment)  PT Evaluation Precautions/Restrictions  Precautions Precautions: Fall Required Braces or Orthoses: No Restrictions Weight Bearing Restrictions: No Prior Functioning    Prior Function Level of Independence: Independent with gait;Independent with basic ADLs;Independent with transfers;Independent with homemaking with ambulation Driving: Yes Cognition Cognition Arousal/Alertness: Awake/alert Overall Cognitive Status: Appears within functional limits for tasks assessed Sensation/Coordination   Extremity Assessment   Mobility (including Balance) Bed Mobility Rolling Right: 6: Modified independent (Device/Increase time);With rail Right Sidelying to Sit: 4: Min assist;With rails;HOB elevated (comment degrees) (25 degrees) Right Sidelying to Sit Details (indicate cue type and reason): Cues for sequence and initiation, min assist to initiate pushing up from bed.  Sitting - Scoot to Edge of Bed: 6: Modified independent (Device/Increase time) Transfers Transfers: Yes Sit to Stand: With upper extremity assist;From bed;3: Mod assist Sit to Stand Details (indicate cue type and reason): Cues for proper hand placement, assistance for anterior translation secondary to pain.  Stand to Sit: 3: Mod assist;With  armrests;To chair/3-in-1 Stand to Sit Details: Verbal and tactile cues for hand placement, assist secondary to pain.  Ambulation/Gait Ambulation/Gait:  Yes Ambulation/Gait Assistance: 4: Min assist Ambulation/Gait Assistance Details (indicate cue type and reason): Min guard secondary to pt. unfamiliar with use of RW and limited by pain and nausea.  Cues for proper use of RW and for breathing.  Ambulation Distance (Feet): 45 Feet Assistive device: Rolling walker Gait Pattern: Within Functional Limits Stairs: No    Exercise    End of Session PT - End of Session Equipment Utilized During Treatment: Other (comment) (No gait belt secondary to rib pain) Activity Tolerance: Patient limited by pain Patient left: in chair;with call bell in reach Nurse Communication: Mobility status for transfers;Mobility status for ambulation General Behavior During Session: [facility] for tasks performed Cognition: [facility] for tasks performed  Laney Pastor, SPT  07/08/2011, 11:04 AM

## 2011-07-09 DIAGNOSIS — W19XXXA Unspecified fall, initial encounter: Secondary | ICD-10-CM | POA: Diagnosis present

## 2011-07-09 DIAGNOSIS — J449 Chronic obstructive pulmonary disease, unspecified: Secondary | ICD-10-CM | POA: Insufficient documentation

## 2011-07-09 DIAGNOSIS — E785 Hyperlipidemia, unspecified: Secondary | ICD-10-CM | POA: Insufficient documentation

## 2011-07-09 DIAGNOSIS — S2242XA Multiple fractures of ribs, left side, initial encounter for closed fracture: Secondary | ICD-10-CM | POA: Diagnosis present

## 2011-07-09 MED ORDER — POLYETHYLENE GLYCOL 3350 17 G PO PACK: 17.0000 g | PACK | Freq: Every day | ORAL | Status: AC

## 2011-07-09 MED ORDER — DSS 100 MG PO CAPS: 100.0000 mg | ORAL_CAPSULE | Freq: Two times a day (BID) | ORAL | Status: AC

## 2011-07-09 MED ORDER — OXYCODONE-ACETAMINOPHEN 5-325 MG PO TABS: 1.0000 | ORAL_TABLET | ORAL | Status: AC | PRN

## 2011-07-09 NOTE — Progress Notes (Signed)
Patient ID: Rachel Walters, female   DOB: [birth year], 77 y.o.   MRN: [id number]   LOS: 4 days   Subjective: No c/o.  Objective: Vital signs in last 24 hours: Temp:  [98.1 F (36.7 C)-98.6 F (37 C)] 98.6 F (37 C) (12/14 0528) Pulse Rate:  [94-97] 94  (12/14 0528) Resp:  [18-20] 18  (12/14 0528) BP: (127-173)/(58-71) 127/58 mmHg (12/14 0528) SpO2:  [89 %-98 %] 98 % (12/14 0528) Last BM Date: 07/04/11   General appearance: alert and no distress Resp: clear to auscultation bilaterally and slight decrease left  Assessment/Plan: Fall  L rib fx 7 and 10 - pulm toilet and pain control and continue therapies  HTN - home meds  VTE -SQ heparin. Change to lovenox.  PT/OT - patient lives alone and unlikely to get back to Independent level soon.  Mediastinal mass - Thoracic surgery to see  DISPO- SNF today hopefully    Freeman Caldron, PA-C Pager: [phone] General Trauma PA Pager: [phone]   07/09/2011

## 2011-07-09 NOTE — Discharge Summary (Signed)
Physician Discharge Summary  Patient ID: Rachel Walters MRN: [id number] DOB/AGE: [birth year] 77 y.o.  Admit date: 07/05/2011 Discharge date: 07/09/2011  Discharge Diagnoses Patient Active Problem List  Diagnoses Date Noted  . Fall 07/09/2011  . Multiple fractures of ribs of left side x2 07/09/2011  . COPD (chronic obstructive pulmonary disease) 07/09/2011  . Hyperlipidemia 07/09/2011  . HYPERTENSION 08/01/2007  . ASTHMATIC BRONCHITIS, ACUTE 08/01/2007  . ALLERGIC RHINITIS, CHRONIC 08/01/2007  . ARTHRITIS 08/01/2007  . HEADACHE, CHRONIC 08/01/2007  . DIVERTICULOSIS, COLON 07/14/2007    Consultants None  Procedures None  HPI: Rachel Walters is a 77 y.o. (DOB: [birth year]) white female whose primary care physician is Hoyle Sauer, MD, MD and came to [facility] for broken ribs on left and desaturation.  I was called by Dr. Ashley Mariner for this lady. The patient has COPD. She quit smoking about 3 years ago. She has a hx of breaking left ribs in 2006 when she wrestled with a garbage can. Today she fell while trying to put on her pants and she landed on a wooden table. She said she had to lay still for about 1 hour to even get up. When her daughter in law tried to bring her to the hospital, she was in too much pain, so they called the ambulance. She did not hit her head, she did not loose consciousness, she did not hurt any other part of her body. Workup demonstrated 2 left rib fractures, #7 and #10, and she was admitted for pain control and pulmonary toilet.   Hospital Course: The patient's hospital course was uneventful. She did not get into trouble from a respiratory standpoint. She was mobilized with physical and occupational therapy but, unfortunately, was in too much pain to be able to mobilize well enough to be able to return home alone. She had no family who could provide 24-hour assistance and so skilled nursing facility was sought and found. She was transferred there in good  condition.    Current Discharge Medication List    START taking these medications   Details  docusate sodium 100 MG CAPS Take 100 mg by mouth 2 (two) times daily.    oxyCODONE-acetaminophen (ROXICET) 5-325 MG per tablet Take 1-2 tablets by mouth every 4 (four) hours as needed for pain. Qty: 36 tablet, Refills: 0    polyethylene glycol (MIRALAX / GLYCOLAX) packet Take 17 g by mouth daily.      CONTINUE these medications which have NOT CHANGED   Details  amLODipine (NORVASC) 2.5 MG tablet Take by mouth daily.      Calcium Carbonate-Vitamin D (CALCIUM 600+D) 600-200 MG-UNIT TABS Take 1 tablet by mouth daily.      cholecalciferol (VITAMIN D) 1000 UNITS tablet Take 1,000 Units by mouth daily.      CLORAZEPATE DIPOTASSIUM PO Take 1 tablet by mouth at bedtime.      lisinopril (PRINIVIL,ZESTRIL) 40 MG tablet Take 40 mg by mouth daily.      metoprolol (LOPRESSOR) 50 MG tablet Take 50 mg by mouth 2 (two) times daily.      Multiple Vitamins-Minerals (CENTRUM SILVER ULTRA WOMENS) TABS Take 1 tablet by mouth daily.      pravastatin (PRAVACHOL) 10 MG tablet Take 10 mg by mouth daily.      traMADol (ULTRAM) 50 MG tablet Take 50 mg by mouth every 6 (six) hours as needed. Pain.Marland KitchenMarland KitchenMarland KitchenMaximum dose= 8 tablets per day     vitamin E 100 UNIT capsule Take 100 Units by mouth daily.  Follow-up Information    Follow up with Hoyle Sauer, MD .        She may followup with her primary care physician. Followup with the trauma service can be on an as-needed basis.  Signed: Freeman Caldron, PA-C Pager: [phone] General Trauma PA Pager: [phone]  07/09/2011, 12:32 PM

## 2011-07-09 NOTE — Progress Notes (Signed)
The patient is stable and should be able to go to SNF today.  This patient has been seen and I agree with the findings and treatment plan.  Marta Lamas. Gae Bon, MD, FACS [phone] (pager) [phone] (direct pager) Trauma Surgeon

## 2011-07-09 NOTE — Progress Notes (Signed)
Clinical Social Worker has completed SBIRT with patient at bedside.  Patient with no current ETOH concerns at this time.  Clinical Social Worker facilitated patient discharge including contacting facility and arranging ambulance transport to [facility].  Patient notified all family members of transfer.    Clinical Social Worker will sign off for now as social work intervention is no longer needed.   [address] [place], [place] [phone]

## 2011-07-09 NOTE — Discharge Summary (Signed)
This patient has been seen and I agree with the findings and treatment plan.  Rilyn Scroggs O. Robbert Langlinais, III, MD, FACS [phone] (pager) [phone] (direct pager) Trauma Surgeon  

## 2011-07-09 NOTE — Progress Notes (Signed)
Pt discharged in stable condition to SNF, accompanied by ambulance personnel.

## 2011-08-05 ENCOUNTER — Other Ambulatory Visit: Payer: Self-pay | Admitting: Thoracic Surgery

## 2011-08-05 DIAGNOSIS — D381 Neoplasm of uncertain behavior of trachea, bronchus and lung: Secondary | ICD-10-CM

## 2011-09-14 ENCOUNTER — Ambulatory Visit: Payer: Medicare Other | Admitting: Thoracic Surgery

## 2011-09-14 ENCOUNTER — Inpatient Hospital Stay: Admission: RE | Admit: 2011-09-14 | Payer: Medicare Other | Source: Ambulatory Visit

## 2011-09-22 ENCOUNTER — Ambulatory Visit (INDEPENDENT_AMBULATORY_CARE_PROVIDER_SITE_OTHER): Payer: Medicare Other | Admitting: Thoracic Surgery

## 2011-09-22 ENCOUNTER — Encounter: Payer: Self-pay | Admitting: Thoracic Surgery

## 2011-09-22 ENCOUNTER — Ambulatory Visit
Admission: RE | Admit: 2011-09-22 | Discharge: 2011-09-22 | Disposition: A | Discharge status: Home / Self Care | Payer: Medicare Other | Source: Ambulatory Visit | Attending: Thoracic Surgery | Admitting: Thoracic Surgery

## 2011-09-22 VITALS — BP 98/61 | HR 84 | Resp 18 | Ht 65.0 in | Wt 127.0 lb

## 2011-09-22 DIAGNOSIS — D381 Neoplasm of uncertain behavior of trachea, bronchus and lung: Secondary | ICD-10-CM

## 2011-09-22 DIAGNOSIS — J984 Other disorders of lung: Secondary | ICD-10-CM

## 2011-09-22 DIAGNOSIS — R911 Solitary pulmonary nodule: Secondary | ICD-10-CM

## 2011-09-22 NOTE — Progress Notes (Signed)
HPI patient returns for followup today. She is looking remarkably better. Although rib fractures are healing. The followup CT scan shows the right lower lobe 5 mm nodule is unchanged. She is been followed by Dr. Vassie Loll. She is doing well overall.  Current Outpatient Prescriptions  Medication Sig Dispense Refill  . amLODipine (NORVASC) 2.5 MG tablet Take by mouth daily.        . Calcium Carbonate-Vitamin D (CALCIUM 600+D) 600-200 MG-UNIT TABS Take 1 tablet by mouth daily.        . cholecalciferol (VITAMIN D) 1000 UNITS tablet Take 1,000 Units by mouth daily.        Marland Kitchen CLORAZEPATE DIPOTASSIUM PO Take 1 tablet by mouth at bedtime.        Marland Kitchen lisinopril (PRINIVIL,ZESTRIL) 40 MG tablet Take 40 mg by mouth daily.        . metoprolol (LOPRESSOR) 50 MG tablet Take 50 mg by mouth 2 (two) times daily.        . Multiple Vitamins-Minerals (CENTRUM SILVER ULTRA WOMENS) TABS Take 1 tablet by mouth daily.        . pravastatin (PRAVACHOL) 10 MG tablet Take 10 mg by mouth daily.        . traMADol (ULTRAM) 50 MG tablet Take 50 mg by mouth every 6 (six) hours as needed. Pain.Marland KitchenMarland KitchenMarland KitchenMaximum dose= 8 tablets per day       . vitamin E 100 UNIT capsule Take 100 Units by mouth daily.           Review of Systems: Unchanged   Physical Exam lungs are clear to auscultation percussion   Diagnostic Tests: CT scan shows stable right lower lobe 5 mm nodule   Impression: Status post left rib fractures secondary to trauma 5 m nodule right lower lobe  PlFollowup by pulmonologist

## 2011-12-24 ENCOUNTER — Other Ambulatory Visit: Payer: Self-pay | Admitting: Internal Medicine

## 2011-12-24 DIAGNOSIS — R1310 Dysphagia, unspecified: Secondary | ICD-10-CM

## 2011-12-28 ENCOUNTER — Ambulatory Visit
Admission: RE | Admit: 2011-12-28 | Discharge: 2011-12-28 | Disposition: A | Discharge status: Home / Self Care | Payer: Medicare Other | Source: Ambulatory Visit | Attending: Internal Medicine | Admitting: Internal Medicine

## 2011-12-28 ENCOUNTER — Other Ambulatory Visit: Payer: Self-pay | Admitting: Internal Medicine

## 2011-12-28 DIAGNOSIS — R1310 Dysphagia, unspecified: Secondary | ICD-10-CM

## 2012-03-02 ENCOUNTER — Telehealth: Payer: Self-pay | Admitting: Internal Medicine

## 2012-03-02 NOTE — Telephone Encounter (Signed)
Scheduled patient on 03/07/12 at 9:00 AM. Left a message for Rachel Walters to call me.

## 2012-03-03 NOTE — Telephone Encounter (Signed)
Spoke with Rachel Walters and gave her appointment date and time. She will fax records to [place].

## 2012-03-07 ENCOUNTER — Ambulatory Visit: Payer: Medicare Other | Admitting: Physician Assistant

## 2012-03-09 ENCOUNTER — Ambulatory Visit: Payer: Medicare Other | Admitting: Physician Assistant

## 2012-03-10 ENCOUNTER — Ambulatory Visit: Payer: Medicare Other | Admitting: Physician Assistant

## 2012-04-28 ENCOUNTER — Telehealth: Payer: Self-pay | Admitting: Internal Medicine

## 2012-04-28 NOTE — Telephone Encounter (Signed)
Patient c/o reflux, belching x 4 months. Her PCP put her on Omeprazole BID but it has not helped. Scheduled OV on 05/02/12 at 11:00 with Willette Cluster, NP.

## 2012-05-02 ENCOUNTER — Encounter: Payer: Self-pay | Admitting: *Deleted

## 2012-05-02 ENCOUNTER — Ambulatory Visit (INDEPENDENT_AMBULATORY_CARE_PROVIDER_SITE_OTHER): Payer: Medicare Other | Admitting: Nurse Practitioner

## 2012-05-02 ENCOUNTER — Encounter: Payer: Self-pay | Admitting: Nurse Practitioner

## 2012-05-02 VITALS — BP 118/70 | HR 60 | Ht 65.25 in | Wt 137.2 lb

## 2012-05-02 DIAGNOSIS — R1310 Dysphagia, unspecified: Secondary | ICD-10-CM | POA: Insufficient documentation

## 2012-05-02 NOTE — Patient Instructions (Addendum)
We discussed importance of eating slowly, taking small bites of food, chewing well and consuming adequate amounts of fluids in between bites. We scheduled the Endoscopy with Dr. Lina Sar on 07-04-2012.     Upper GI Endoscopy Upper GI endoscopy means using a flexible scope to look at the esophagus, stomach, and upper small bowel. This is done to make a diagnosis in people with heartburn, abdominal pain, or abnormal bleeding. Sometimes an endoscope is needed to remove foreign bodies or food that become stuck in the esophagus; it can also be used to take biopsy samples. For the best results, do not eat or drink for 8 hours before having your upper endoscopy.  To perform the endoscopy, you will probably be sedated and your throat will be numbed with a special spray. The endoscope is then slowly passed down your throat (this will not interfere with your breathing). An endoscopy exam takes 15 to 30 minutes to complete and there is no real pain. Patients rarely remember much about the procedure. The results of the test may take several days if a biopsy or other test is taken.  You may have a sore throat after an endoscopy exam. Serious complications are very rare. Stick to liquids and soft foods until your pain is better. Do not drive a car or operate any dangerous equipment for at least 24 hours after being sedated. SEEK IMMEDIATE MEDICAL CARE IF:   You have severe throat pain.   You have shortness of breath.   You have bleeding problems.   You have a fever.   You have difficulty recovering from your sedation.  Document Released: 08/19/2004 Document Revised: 07/01/2011 Document Reviewed: 07/14/2008 [facility] Patient Information 2012 [place], [place]. provided.

## 2012-05-02 NOTE — Progress Notes (Signed)
05/02/2012 Rachel Walters [id number] 03/16/1926   HISTORY OF PRESENT ILLNESS: Patient is an 78-year-old female seen by Dr. Brodie in 2008 for evaluation of constipation. Colonoscopy in 2008 pertinent for severe diverticulosis   Patient is worked in today for evaluation of reflux, belching, and dysphasia. She took Omeprazole twice a day for several months but it didn't help. Swallowing problems began about a year ago when patient began having problems swallowing medications. Tilting head to the right helps seems to facilitate passage of pills.  Last week patient regurgitated undigested salad which she had eaten 2 hours earlier. Patient does not feel that food gets stuck in her esophagus while she is eating despite subsequent regurgitation of food. No heartburn. Her PCP obtained an upper GI series in June of this year. Mild tertiary contractions seen in the mid and distal esophagus. She had a tiny hiatal hernia. The barium pill passed without delay.  Patient has a history of chronic constipation, she is doing well on Benefiber in addition to MiraLax as needed  Past Medical History  Diagnosis Date  . Arthritis   . COPD (chronic obstructive pulmonary disease)   . Hypertension   . Headache   . Neuromuscular disorder   . Asthmatic bronchitis    Past Surgical History  Procedure Date  . Neck surgery 1992 and 1993    reports that she quit smoking about 3 years ago. She has never used smokeless tobacco. She reports that she does not drink alcohol or use illicit drugs. family history includes Breast cancer in her sister and Diabetes in her maternal aunt, mother, and sister. Allergies  Allergen Reactions  . Sulfa Antibiotics Other (See Comments)    Skin turned yellow      Outpatient Encounter Prescriptions as of 05/02/2012  Medication Sig Dispense Refill  . amLODipine (NORVASC) 2.5 MG tablet Take by mouth daily.        . Calcium Carbonate-Vitamin D (CALCIUM 600+D) 600-200 MG-UNIT TABS Take 1  tablet by mouth daily.        . cholecalciferol (VITAMIN D) 1000 UNITS tablet Take 1,000 Units by mouth daily.        . CLORAZEPATE DIPOTASSIUM PO Take 1 tablet by mouth at bedtime.        . lisinopril (PRINIVIL,ZESTRIL) 40 MG tablet Take 40 mg by mouth daily.        . metoprolol (LOPRESSOR) 50 MG tablet Take 50 mg by mouth 2 (two) times daily.        . Multiple Vitamins-Minerals (CENTRUM SILVER ULTRA WOMENS) TABS Take 1 tablet by mouth daily.        . pravastatin (PRAVACHOL) 10 MG tablet Take 10 mg by mouth daily.        . traMADol (ULTRAM) 50 MG tablet Take 50 mg by mouth every 6 (six) hours as needed. Pain....Maximum dose= 8 tablets per day       . vitamin E 100 UNIT capsule Take 100 Units by mouth daily.           REVIEW OF SYSTEMS  : All other systems reviewed and negative except where noted in the History of Present Illness.   PHYSICAL EXAM: BP 118/70  Pulse 60  Ht 5' 5.25" (1.657 m)  Wt 137 lb 3.2 oz (62.234 kg)  BMI 22.66 kg/m2 General: Well developed white female in no acute distress Head: Normocephalic and atraumatic Eyes:  sclerae anicteric,conjunctive pink. Ears: Normal auditory acuity Mouth: No lesions or exudates Neck: Supple, no masses.  Lungs:   Clear throughout to auscultation Heart: Regular rate and rhythm Abdomen: Soft, nontender, non distended. No masses or hepatomegaly noted. Normal bowel sounds Musculoskeletal: Symmetrical with no gross deformities  Skin: No lesions on visible extremities Extremities: No edema  Neurological: Alert oriented x 4, grossly nonfocal Cervical Nodes:  No significant cervical adenopathy Psychological:  Alert and cooperative. Normal mood and affect  ASSESSMENT AND PLAN:   Dysphasia/regurgitation of food. Barium swallow with tablet in June showed only mild tertiary contractions of the mid and distal esophagus. Symptoms refractory to several months of PPI therapy. We discussed treatment options. Patient does not have a stricture based  on her barium swallow. Dysphasia/regurgitation could be just secondary to dysmotility. We discussed EGD with empirical dilation. Patient understands this may not improve her swallowing but would like to proceed as symptoms are decreasing quality of life. The benefits, risks, and potential complications of EGD with possible biopsies and/or dilation were discussed with the patient and she agrees to proceed. We discussed importance of eating slowly, taking small bites of food, chewing well and consuming adequate amounts of fluids in between bites.      

## 2012-05-04 NOTE — Progress Notes (Signed)
Reviewed, please try to move the EGD to my hospital week.

## 2012-05-08 ENCOUNTER — Telehealth: Payer: Self-pay | Admitting: *Deleted

## 2012-05-08 DIAGNOSIS — R1310 Dysphagia, unspecified: Secondary | ICD-10-CM

## 2012-05-08 NOTE — Telephone Encounter (Signed)
Per Dr. Regino Schultze request moved EGD with dil to [facility] endo on 05/19/12 at 9:00/10:15 AM. Booking number [phone]. Left a message for patient to call me.

## 2012-05-08 NOTE — Telephone Encounter (Signed)
Patient given new date/time for procedure. She understands to follow instructions she was given but with new date/times.

## 2012-05-16 ENCOUNTER — Telehealth: Payer: Self-pay | Admitting: Internal Medicine

## 2012-05-16 NOTE — Telephone Encounter (Signed)
Patient wanted to be sure that Dr. Juanda Chance wants her to have procedure. Told her Dr. Juanda Chance reviewed Willette Cluster, NP note and agreed she needs procedure.

## 2012-05-19 ENCOUNTER — Encounter (HOSPITAL_COMMUNITY): Payer: Self-pay

## 2012-05-19 ENCOUNTER — Encounter (HOSPITAL_COMMUNITY)
Admission: RE | Disposition: A | Discharge status: Home / Self Care | Payer: Self-pay | Source: Ambulatory Visit | Attending: Internal Medicine

## 2012-05-19 ENCOUNTER — Ambulatory Visit (HOSPITAL_COMMUNITY)
Admission: RE | Admit: 2012-05-19 | Discharge: 2012-05-19 | Disposition: A | Discharge status: Home / Self Care | Payer: Medicare Other | Source: Ambulatory Visit | Attending: Internal Medicine | Admitting: Internal Medicine

## 2012-05-19 DIAGNOSIS — K449 Diaphragmatic hernia without obstruction or gangrene: Secondary | ICD-10-CM | POA: Insufficient documentation

## 2012-05-19 DIAGNOSIS — R1310 Dysphagia, unspecified: Secondary | ICD-10-CM

## 2012-05-19 DIAGNOSIS — A048 Other specified bacterial intestinal infections: Secondary | ICD-10-CM | POA: Insufficient documentation

## 2012-05-19 DIAGNOSIS — K224 Dyskinesia of esophagus: Secondary | ICD-10-CM | POA: Insufficient documentation

## 2012-05-19 DIAGNOSIS — K2289 Other specified disease of esophagus: Secondary | ICD-10-CM | POA: Insufficient documentation

## 2012-05-19 DIAGNOSIS — Z79899 Other long term (current) drug therapy: Secondary | ICD-10-CM | POA: Insufficient documentation

## 2012-05-19 DIAGNOSIS — K219 Gastro-esophageal reflux disease without esophagitis: Secondary | ICD-10-CM

## 2012-05-19 DIAGNOSIS — J4489 Other specified chronic obstructive pulmonary disease: Secondary | ICD-10-CM | POA: Insufficient documentation

## 2012-05-19 DIAGNOSIS — J449 Chronic obstructive pulmonary disease, unspecified: Secondary | ICD-10-CM | POA: Insufficient documentation

## 2012-05-19 DIAGNOSIS — K228 Other specified diseases of esophagus: Secondary | ICD-10-CM | POA: Insufficient documentation

## 2012-05-19 DIAGNOSIS — I10 Essential (primary) hypertension: Secondary | ICD-10-CM | POA: Insufficient documentation

## 2012-05-19 DIAGNOSIS — K2960 Other gastritis without bleeding: Secondary | ICD-10-CM | POA: Insufficient documentation

## 2012-05-19 HISTORY — DX: Gastro-esophageal reflux disease without esophagitis: K21.9

## 2012-05-19 HISTORY — PX: BALLOON DILATION: SHX5330

## 2012-05-19 SURGERY — ESOPHAGOGASTRODUODENOSCOPY (EGD) WITH ESOPHAGEAL DILATION
Anesthesia: Moderate Sedation

## 2012-05-19 MED ORDER — FENTANYL CITRATE 0.05 MG/ML IJ SOLN
INTRAMUSCULAR | Status: AC
  Filled 2012-05-19: qty 4

## 2012-05-19 MED ORDER — SODIUM CHLORIDE 0.9 % IV SOLN: INTRAVENOUS | Status: DC

## 2012-05-19 MED ORDER — FENTANYL CITRATE 0.05 MG/ML IJ SOLN
INTRAMUSCULAR | Status: DC | PRN
  Administered 2012-05-19: 10 ug via INTRAVENOUS
  Administered 2012-05-19: 15 ug via INTRAVENOUS
  Administered 2012-05-19 (×2): 25 ug via INTRAVENOUS

## 2012-05-19 MED ORDER — BUTAMBEN-TETRACAINE-BENZOCAINE 2-2-14 % EX AERO
INHALATION_SPRAY | CUTANEOUS | Status: DC | PRN
  Administered 2012-05-19: 2 via TOPICAL

## 2012-05-19 MED ORDER — MIDAZOLAM HCL 10 MG/2ML IJ SOLN
INTRAMUSCULAR | Status: DC | PRN
  Administered 2012-05-19 (×2): 1 mg via INTRAVENOUS
  Administered 2012-05-19: 1.5 mg via INTRAVENOUS
  Administered 2012-05-19 (×2): 2 mg via INTRAVENOUS

## 2012-05-19 MED ORDER — DIPHENHYDRAMINE HCL 50 MG/ML IJ SOLN
INTRAMUSCULAR | Status: AC
  Filled 2012-05-19: qty 1

## 2012-05-19 MED ORDER — SUCRALFATE 1 GM/10ML PO SUSP: 1.0000 g | Freq: Two times a day (BID) | ORAL | Status: DC

## 2012-05-19 MED ORDER — SODIUM CHLORIDE 0.9 % IV SOLN
INTRAVENOUS | Status: DC
Start: 2012-05-19 — End: 2012-05-19

## 2012-05-19 MED ORDER — MIDAZOLAM HCL 10 MG/2ML IJ SOLN
INTRAMUSCULAR | Status: AC
  Filled 2012-05-19: qty 4

## 2012-05-19 MED ORDER — OMEPRAZOLE 40 MG PO CPDR: 40.0000 mg | DELAYED_RELEASE_CAPSULE | Freq: Two times a day (BID) | ORAL | Status: DC

## 2012-05-19 NOTE — H&P (View-Only) (Signed)
05/02/2012 ROSELL KHOURI [id number] 1925/08/16   HISTORY OF PRESENT ILLNESS: Patient is an 78 year old female seen by Dr. Juanda Chance in 2008 for evaluation of constipation. Colonoscopy in 2008 pertinent for severe diverticulosis   Patient is worked in today for evaluation of reflux, belching, and dysphasia. She took Omeprazole twice a day for several months but it didn't help. Swallowing problems began about a year ago when patient began having problems swallowing medications. Tilting head to the right helps seems to facilitate passage of pills.  Last week patient regurgitated undigested salad which she had eaten 2 hours earlier. Patient does not feel that food gets stuck in her esophagus while she is eating despite subsequent regurgitation of food. No heartburn. Her PCP obtained an upper GI series in June of this year. Mild tertiary contractions seen in the mid and distal esophagus. She had a tiny hiatal hernia. The barium pill passed without delay.  Patient has a history of chronic constipation, she is doing well on Benefiber in addition to MiraLax as needed  Past Medical History  Diagnosis Date  . Arthritis   . COPD (chronic obstructive pulmonary disease)   . Hypertension   . Headache   . Neuromuscular disorder   . Asthmatic bronchitis    Past Surgical History  Procedure Date  . Neck surgery 1992 and 1993    reports that she quit smoking about 3 years ago. She has never used smokeless tobacco. She reports that she does not drink alcohol or use illicit drugs. family history includes Breast cancer in her sister and Diabetes in her maternal aunt, mother, and sister. Allergies  Allergen Reactions  . Sulfa Antibiotics Other (See Comments)    Skin turned yellow      Outpatient Encounter Prescriptions as of 05/02/2012  Medication Sig Dispense Refill  . amLODipine (NORVASC) 2.5 MG tablet Take by mouth daily.        . Calcium Carbonate-Vitamin D (CALCIUM 600+D) 600-200 MG-UNIT TABS Take 1  tablet by mouth daily.        . cholecalciferol (VITAMIN D) 1000 UNITS tablet Take 1,000 Units by mouth daily.        Marland Kitchen CLORAZEPATE DIPOTASSIUM PO Take 1 tablet by mouth at bedtime.        Marland Kitchen lisinopril (PRINIVIL,ZESTRIL) 40 MG tablet Take 40 mg by mouth daily.        . metoprolol (LOPRESSOR) 50 MG tablet Take 50 mg by mouth 2 (two) times daily.        . Multiple Vitamins-Minerals (CENTRUM SILVER ULTRA WOMENS) TABS Take 1 tablet by mouth daily.        . pravastatin (PRAVACHOL) 10 MG tablet Take 10 mg by mouth daily.        . traMADol (ULTRAM) 50 MG tablet Take 50 mg by mouth every 6 (six) hours as needed. Pain.Marland KitchenMarland KitchenMarland KitchenMaximum dose= 8 tablets per day       . vitamin E 100 UNIT capsule Take 100 Units by mouth daily.           REVIEW OF SYSTEMS  : All other systems reviewed and negative except where noted in the History of Present Illness.   PHYSICAL EXAM: BP 118/70  Pulse 60  Ht 5' 5.25" (1.657 m)  Wt 137 lb 3.2 oz (62.234 kg)  BMI 22.66 kg/m2 General: Well developed white female in no acute distress Head: Normocephalic and atraumatic Eyes:  sclerae anicteric,conjunctive pink. Ears: Normal auditory acuity Mouth: No lesions or exudates Neck: Supple, no masses.  Lungs:  Clear throughout to auscultation Heart: Regular rate and rhythm Abdomen: Soft, nontender, non distended. No masses or hepatomegaly noted. Normal bowel sounds Musculoskeletal: Symmetrical with no gross deformities  Skin: No lesions on visible extremities Extremities: No edema  Neurological: Alert oriented x 4, grossly nonfocal Cervical Nodes:  No significant cervical adenopathy Psychological:  Alert and cooperative. Normal mood and affect  ASSESSMENT AND PLAN:   Dysphasia/regurgitation of food. Barium swallow with tablet in June showed only mild tertiary contractions of the mid and distal esophagus. Symptoms refractory to several months of PPI therapy. We discussed treatment options. Patient does not have a stricture based  on her barium swallow. Dysphasia/regurgitation could be just secondary to dysmotility. We discussed EGD with empirical dilation. Patient understands this may not improve her swallowing but would like to proceed as symptoms are decreasing quality of life. The benefits, risks, and potential complications of EGD with possible biopsies and/or dilation were discussed with the patient and she agrees to proceed. We discussed importance of eating slowly, taking small bites of food, chewing well and consuming adequate amounts of fluids in between bites.

## 2012-05-19 NOTE — Interval H&P Note (Signed)
History and Physical Interval Note:  05/19/2012 10:47 AM  Rachel Walters  has presented today for surgery, with the diagnosis of dysphagia  The various methods of treatment have been discussed with the patient and family. After consideration of risks, benefits and other options for treatment, the patient has consented to  Procedure(s) (LRB) with comments: ESOPHAGOGASTRODUODENOSCOPY (EGD) WITH ESOPHAGEAL DILATION (N/A) BALLOON DILATION (N/A) as a surgical intervention .  The patient's history has been reviewed, patient examined, no change in status, stable for surgery.  I have reviewed the patient's chart and labs.  Questions were answered to the patient's satisfaction.     Lina Sar

## 2012-05-19 NOTE — Op Note (Signed)
[facility] [address] [place], [address]   ENDOSCOPY PROCEDURE REPORT  PATIENT: Rachel, Walters  MR#: [id number] BIRTHDATE: 1925/10/31 , 86  yrs. old GENDER: Female ENDOSCOPIST: Hart Carwin, MD REFERRED BY:  Francis Gaines, M.D. PROCEDURE DATE:  05/19/2012 PROCEDURE:  EGD w/ biopsy and Savary dilation of esophagus ASA CLASS:     Class III INDICATIONS:  heartburn.   history of esophageal reflux. esophageal dysmotility on UGI series. ,pill dysphagia, refractory to PPI MEDICATIONS: These medications were titrated to patient response per physician's verbal order, Versed, Versed-Detailed 7.5 mg IV, and Fentanyl-Detailed 75 mcg IV TOPICAL ANESTHETIC: Cetacaine Spray  DESCRIPTION OF PROCEDURE: After the risks benefits and alternatives of the procedure were thoroughly explained, informed consent was obtained.  The Pentax Gastroscope [id number] endoscope was introduced through the mouth and advanced to the second portion of the duodenum. Without limitations.  The instrument was slowly withdrawn as the mucosa was fully examined.      ESOPHAGUS: Presbyesophagus.   A 1 cm hiatal hernia was noted. mildly dilated esophagus, no stricture, no retained food in the esophagus, endoscope traversed into the stomach without resistance 16 mm Savary dilator passed over a guidewire without difficluty, there was small amount of blood on the dilator  Retroflexed views revealed no abnormalities.     The scope was then withdrawn from the patient and the procedure completed.  STOMACH: mild gastritis- antrum, biopsies to r/o H.Pylori  COMPLICATIONS: There were no complications. ENDOSCOPIC IMPRESSION: 1.   Presbyesophagus 2.   1 cm hiatal hernia 3. passage of 16 mm Savary dilator, no endoscopically apparent stricture  RECOMMENDATIONS:  Increase Prilosec to 40 mg po bid, her heartburn is due to deminished acid clearance due to poor peristalsis Await biopsy results  REPEAT  EXAM: no repeat EGD necessary  eSigned:  Hart Carwin, MD 05/19/2012 12:44 PM   CC:  PATIENT NAME:  Rachel, Walters MR#: [id number]

## 2012-05-22 ENCOUNTER — Encounter (HOSPITAL_COMMUNITY): Payer: Self-pay

## 2012-05-22 ENCOUNTER — Encounter: Payer: Self-pay | Admitting: Internal Medicine

## 2012-05-22 ENCOUNTER — Other Ambulatory Visit: Payer: Self-pay | Admitting: *Deleted

## 2012-05-22 ENCOUNTER — Encounter (HOSPITAL_COMMUNITY): Payer: Self-pay | Admitting: Internal Medicine

## 2012-05-22 MED ORDER — CLARITHROMYCIN 500 MG PO TABS: ORAL_TABLET | ORAL | Status: DC

## 2012-05-22 MED ORDER — AMOXICILLIN 500 MG PO CAPS: ORAL_CAPSULE | ORAL | Status: DC

## 2012-05-22 NOTE — Progress Notes (Signed)
Case ID: [id number] Member Number: [id number]  Case Type: Initial Review Case Start Date: 05/22/2012  Case Status: Coverage has been APPROVED. You will receive a confirmation letter confirming approval of this medication. The patient will also be notified of this approval via an automated outbound phone call or a letter. Please allow approximately 2 hours to update our system with the approval. Once updated, the prescription can be re-submitted.   Coverage  Start Date: 05/01/2012 Coverage  End Date: 11/18/2012   Patient  First Name: Rachel Patient  Last Name: Walters DOB: Jun 13, 1926  Patient  Street Address: [address] [place]  Patient City: [place] Patient State: [place] Patient Zip: [phone]   Drug Name  & Strength: OMEPRAZOLE 40 MG CAPSULE DR

## 2012-06-02 ENCOUNTER — Telehealth: Payer: Self-pay | Admitting: Internal Medicine

## 2012-06-02 NOTE — Telephone Encounter (Signed)
Spoke with patient and explained that the 7 day course of antibiotics completed her treatment for h. Pylori.

## 2012-07-04 ENCOUNTER — Encounter: Payer: Medicare Other | Admitting: Internal Medicine

## 2013-06-06 ENCOUNTER — Other Ambulatory Visit (HOSPITAL_COMMUNITY): Payer: Medicare Other

## 2013-06-13 ENCOUNTER — Ambulatory Visit (HOSPITAL_COMMUNITY)
Admission: RE | Admit: 2013-06-13 | Discharge: 2013-06-13 | Disposition: A | Discharge status: Home / Self Care | Payer: Medicare Other | Source: Ambulatory Visit | Attending: Vascular Surgery | Admitting: Vascular Surgery

## 2013-06-13 ENCOUNTER — Encounter (INDEPENDENT_AMBULATORY_CARE_PROVIDER_SITE_OTHER): Payer: Self-pay

## 2013-06-13 ENCOUNTER — Other Ambulatory Visit (HOSPITAL_COMMUNITY): Payer: Self-pay | Admitting: Internal Medicine

## 2013-06-13 DIAGNOSIS — I6529 Occlusion and stenosis of unspecified carotid artery: Secondary | ICD-10-CM | POA: Insufficient documentation

## 2014-04-23 ENCOUNTER — Ambulatory Visit (INDEPENDENT_AMBULATORY_CARE_PROVIDER_SITE_OTHER): Payer: Medicare Other | Admitting: Pulmonary Disease

## 2014-04-23 ENCOUNTER — Other Ambulatory Visit (INDEPENDENT_AMBULATORY_CARE_PROVIDER_SITE_OTHER): Payer: Medicare Other

## 2014-04-23 ENCOUNTER — Encounter: Payer: Self-pay | Admitting: Pulmonary Disease

## 2014-04-23 VITALS — BP 126/78 | HR 86 | Temp 97.1°F | Ht 65.0 in | Wt 137.6 lb

## 2014-04-23 DIAGNOSIS — R0989 Other specified symptoms and signs involving the circulatory and respiratory systems: Secondary | ICD-10-CM

## 2014-04-23 DIAGNOSIS — R0600 Dyspnea, unspecified: Secondary | ICD-10-CM

## 2014-04-23 DIAGNOSIS — R0602 Shortness of breath: Secondary | ICD-10-CM

## 2014-04-23 DIAGNOSIS — R0609 Other forms of dyspnea: Secondary | ICD-10-CM

## 2014-04-23 DIAGNOSIS — J438 Other emphysema: Secondary | ICD-10-CM

## 2014-04-23 DIAGNOSIS — J9610 Chronic respiratory failure, unspecified whether with hypoxia or hypercapnia: Secondary | ICD-10-CM

## 2014-04-23 DIAGNOSIS — J9611 Chronic respiratory failure with hypoxia: Secondary | ICD-10-CM

## 2014-04-23 DIAGNOSIS — R0902 Hypoxemia: Secondary | ICD-10-CM

## 2014-04-23 DIAGNOSIS — Z23 Encounter for immunization: Secondary | ICD-10-CM

## 2014-04-23 HISTORY — DX: Dyspnea, unspecified: R06.00

## 2014-04-23 LAB — BASIC METABOLIC PANEL
BUN: 18 mg/dL (ref 6–23)
CALCIUM: 9.8 mg/dL (ref 8.4–10.5)
CO2: 31 mEq/L (ref 19–32)
Chloride: 102 mEq/L (ref 96–112)
Creatinine, Ser: 1.1 mg/dL (ref 0.4–1.2)
GFR: 48.5 mL/min — ABNORMAL LOW (ref >60.00)
GLUCOSE: 107 mg/dL — AB (ref 70–99)
POTASSIUM: 4.4 meq/L (ref 3.5–5.1)
Sodium: 138 mEq/L (ref 135–145)

## 2014-04-23 LAB — CBC WITH DIFFERENTIAL/PLATELET
Basophils Absolute: 0.1 10*3/uL (ref 0.0–0.1)
Basophils Relative: 0.7 % (ref 0.0–3.0)
EOS PCT: 3 % (ref 0.0–5.0)
Eosinophils Absolute: 0.3 10*3/uL (ref 0.0–0.7)
HEMATOCRIT: 41.6 % (ref 36.0–46.0)
Hemoglobin: 14 g/dL (ref 12.0–15.0)
LYMPHS PCT: 13.7 % (ref 12.0–46.0)
Lymphs Abs: 1.2 10*3/uL (ref 0.7–4.0)
MCHC: 33.7 g/dL (ref 30.0–36.0)
MCV: 99.7 fl (ref 78.0–100.0)
MONOS PCT: 8.1 % (ref 3.0–12.0)
Monocytes Absolute: 0.7 10*3/uL (ref 0.1–1.0)
NEUTROS PCT: 74.5 % (ref 43.0–77.0)
Neutro Abs: 6.6 10*3/uL (ref 1.4–7.7)
Platelets: 245 10*3/uL (ref 150.0–400.0)
RBC: 4.18 Mil/uL (ref 3.87–5.11)
RDW: 12.4 % (ref 11.5–15.5)
WBC: 8.9 10*3/uL (ref 4.0–10.5)

## 2014-04-23 LAB — BRAIN NATRIURETIC PEPTIDE: Pro B Natriuretic peptide (BNP): 94 pg/mL (ref 0.0–100.0)

## 2014-04-23 MED ORDER — FUROSEMIDE 40 MG PO TABS: 40.0000 mg | ORAL_TABLET | Freq: Every day | ORAL | Status: DC

## 2014-04-23 NOTE — Progress Notes (Signed)
Subjective:    Patient ID: Rachel Walters, female    DOB: 12-20-1925, 80 y.o.   MRN: [id number]  HPI  Rachel Walters is here to see me for shortness of breath. It is particularly noticeable when she walks to the mailbox. She has been taking a nose spray.  A chest x-ray recently showed some fluid on her lungs.    She noticed dyspnea off and on for about a year, but it is consistently a problem now.  She feels like the problem has worsened some degree in the last few months.  Just walking on level ground will make her dyspnic.  She hasn't been doing much of anything in the last few weeks because of the dyspnea. She is currently using oxygen at night but not during the daytime.  She uses it when she takes a nap.   She was told that she had COPD about 4 years.  She previously smoked about 3/4 pack per day for about 63 years.  Childhood was normal without respiratory illnesses.    She has not had too many respiratory problems over the years until recently.  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    She currently coughs up grey mucus whic hhas been worse in the last year. She coughs up mucus typicaly before she goes to bed.  She is typically uses albuterol in the morning.  No chest pain, but she does have some ankle swelling.  Sometimes she has worse swelling in her ankles at night.    Past Medical History  Diagnosis Date  . Arthritis   . COPD (chronic obstructive pulmonary disease)   . Hypertension   . Headache(784.0)   . Neuromuscular disorder   . Asthmatic bronchitis   . Esophageal reflux 05/19/2012  . Dyspnea 04/23/2014     Family History  Problem Relation Age of Onset  . Diabetes Mother   . Diabetes Sister   . Diabetes Maternal Aunt   . Breast cancer Sister      History   Social History  . Marital Status: Married    Spouse Name: N/A    Number of Children: 3  . Years of Education: N/A   Occupational History  . Not on file.    Social History Main Topics  . Smoking status: Former Smoker -- 0.50 packs/day for 50 years    Types: Cigarettes    Quit date: 07/05/2011  . Smokeless tobacco: Never Used  . Alcohol Use: No  . Drug Use: No  . Sexual Activity: No   Other Topics Concern  . Not on file   Social History Narrative  . No narrative on file     Allergies  Allergen Reactions  . Sulfa Antibiotics Other (See Comments)    Skin turned yellow     Outpatient Prescriptions Prior to Visit  Medication Sig Dispense Refill  . amLODipine (NORVASC) 2.5 MG tablet Take by mouth daily.        . Calcium Carbonate-Vitamin D (CALCIUM 600+D) 600-200 MG-UNIT TABS Take 1 tablet by mouth daily.        . cholecalciferol (VITAMIN D) 1000 UNITS tablet Take 1,000 Units by mouth daily.        Marland Kitchen CLORAZEPATE DIPOTASSIUM PO Take 7.5 mg by mouth at bedtime.       . metoprolol (LOPRESSOR) 50 MG tablet Take 50 mg by mouth 2 (two) times daily.        . Multiple Vitamins-Minerals (  CENTRUM SILVER ULTRA WOMENS) TABS Take 1 tablet by mouth daily.        Marland Kitchen omeprazole (PRILOSEC) 40 MG capsule Take 1 capsule (40 mg total) by mouth 2 (two) times daily at 10 AM and 5 PM.  60 capsule  3  . pravastatin (PRAVACHOL) 10 MG tablet Take 10 mg by mouth daily.        . sucralfate (CARAFATE) 1 GM/10ML suspension Take 10 mLs (1 g total) by mouth 2 (two) times daily.  420 mL  2  . traMADol (ULTRAM) 50 MG tablet Take 50 mg by mouth every 6 (six) hours as needed. Pain.Marland KitchenMarland KitchenMarland KitchenMaximum dose= 8 tablets per day       . vitamin E 100 UNIT capsule Take 100 Units by mouth daily.        Marland Kitchen amoxicillin (AMOXIL) 500 MG capsule Take one po TID x 7 days  21 capsule  0  . clarithromycin (BIAXIN) 500 MG tablet Take one po TID x 7 days  21 tablet  0  . lisinopril (PRINIVIL,ZESTRIL) 40 MG tablet Take 40 mg by mouth daily.         No facility-administered medications prior to visit.      Review of Systems  Constitutional: Negative for fever and unexpected weight change.   HENT: Positive for congestion. Negative for dental problem, ear pain, nosebleeds, postnasal drip, rhinorrhea, sinus pressure, sneezing, sore throat and trouble swallowing.   Eyes: Negative for redness and itching.  Respiratory: Positive for cough and shortness of breath. Negative for chest tightness and wheezing.   Cardiovascular: Negative for palpitations and leg swelling.  Gastrointestinal: Negative for nausea and vomiting.  Genitourinary: Negative for dysuria.  Musculoskeletal: Negative for joint swelling.  Skin: Negative for rash.  Neurological: Negative for headaches.  Hematological: Does not bruise/bleed easily.  Psychiatric/Behavioral: Negative for dysphoric mood. The patient is not nervous/anxious.        Objective:   Physical Exam Filed Vitals:   04/23/14 1512  BP: 126/78  Pulse: 86  Temp: 97.1 F (36.2 C)  TempSrc: Oral  Height: 5\' 5"  (1.651 m)  Weight: 137 lb 9.6 oz (62.415 kg)  SpO2: 91%  RA  Walked about 100 feet on room air and her O2 saturation dropped to 87%  Gen: well appearing, no acute distress HEENT: NCAT, PERRL, EOMi, OP clear, neck supple without masses PULM: Crackles in bases bilaterally, wheezes upper lobes CV: RRR, no mgr, no JVD AB: BS+, soft, nontender, no hsm Ext: warm, chronic ankle edema, no clubbing, no cyanosis Derm: no rash or skin breakdown Neuro: A&Ox4, CN II-XII intact, strength 5/5 in all 4 extremities  02/2014 CXR reviewed> emphysema, ?intersitial edema and fluid in fissure on left?     Assessment & Plan:   Dyspnea It is not clear to me today whether or not her shortness of breath is just do to COPD. Her chest x-ray recently showed evidence of pleural thickening versus pleural effusion with possible interstitial edema. Given her chronic leg swelling it is reasonable to get an echocardiogram to look for evidence of heart failure. I explained to her today that the differential diagnosis of shortness of breath is broad and includes  heart disease, lung disease, hematologic disease, and neurologic disease. On physical exam she has crackles and wheezing which raises concern for both COPD and CHF. She does not appear to have neuromuscular weakness. I do not have a recent H&H.  Plan: -trial lasix x5 days then report back if improvement in symptoms. (check  BMET today) -Obtain record of radiology report from her most recent (August 2015) chest x-ray -Spirometry and ambulatory oximetry measurement today -See COPD below -Check CBC and pro BNP -Echocardiogram  Chronic hypoxemic respiratory failure Today she was noted to have low oxygen on ambulation as her O2 saturation at 87% on RA after walking just about 100 feet.  We will need to start 2L O2 with exertion  COPD (chronic obstructive pulmonary disease) Her simple spirometry today showed severe airflow obstruction.  However, I wonder if there is also some pulmonary edema contributing to her dyspnea as detailed above.  Plan: -add Advair (samples given of 250/50 discus one puff bid) -continue Tudorza and albuterol -flu shot    Updated Medication List Outpatient Encounter Prescriptions as of 04/23/2014  Medication Sig  . Aclidinium Bromide (TUDORZA PRESSAIR) 400 MCG/ACT AEPB Inhale 1 puff into the lungs 2 (two) times daily.  Marland Kitchen acyclovir (ZOVIRAX) 800 MG tablet Take 1 tablet by mouth daily.  Marland Kitchen amLODipine (NORVASC) 2.5 MG tablet Take by mouth daily.    . Calcium Carbonate-Vitamin D (CALCIUM 600+D) 600-200 MG-UNIT TABS Take 1 tablet by mouth daily.    . cholecalciferol (VITAMIN D) 1000 UNITS tablet Take 1,000 Units by mouth daily.    Marland Kitchen CLORAZEPATE DIPOTASSIUM PO Take 7.5 mg by mouth at bedtime.   . metoprolol (LOPRESSOR) 50 MG tablet Take 50 mg by mouth 2 (two) times daily.    . Multiple Vitamins-Minerals (CENTRUM SILVER ULTRA WOMENS) TABS Take 1 tablet by mouth daily.    Marland Kitchen olmesartan (BENICAR) 40 MG tablet Take 40 mg by mouth daily.  Marland Kitchen omeprazole (PRILOSEC) 40 MG capsule  Take 1 capsule (40 mg total) by mouth 2 (two) times daily at 10 AM and 5 PM.  . pravastatin (PRAVACHOL) 10 MG tablet Take 10 mg by mouth daily.    Marland Kitchen PROAIR HFA 108 (90 BASE) MCG/ACT inhaler Inhale 2 puffs into the lungs every 6 (six) hours as needed.  . sucralfate (CARAFATE) 1 GM/10ML suspension Take 10 mLs (1 g total) by mouth 2 (two) times daily.  . traMADol (ULTRAM) 50 MG tablet Take 50 mg by mouth every 6 (six) hours as needed. Pain.Marland KitchenMarland KitchenMarland KitchenMaximum dose= 8 tablets per day   . vitamin E 100 UNIT capsule Take 100 Units by mouth daily.    . furosemide (LASIX) 40 MG tablet Take 1 tablet (40 mg total) by mouth daily.  . [DISCONTINUED] amoxicillin (AMOXIL) 500 MG capsule Take one po TID x 7 days  . [DISCONTINUED] clarithromycin (BIAXIN) 500 MG tablet Take one po TID x 7 days  . [DISCONTINUED] lisinopril (PRINIVIL,ZESTRIL) 40 MG tablet Take 40 mg by mouth daily.

## 2014-04-23 NOTE — Assessment & Plan Note (Signed)
Her simple spirometry today showed severe airflow obstruction.  However, I wonder if there is also some pulmonary edema contributing to her dyspnea as detailed above.  Plan: -add Advair (samples given of 250/50 discus one puff bid) -continue Tudorza and albuterol -flu shot

## 2014-04-23 NOTE — Assessment & Plan Note (Addendum)
Today she was noted to have low oxygen on ambulation as her O2 saturation at 87% on RA after walking just about 100 feet.  We will need to start 2L O2 with exertion

## 2014-04-23 NOTE — Assessment & Plan Note (Addendum)
It is not clear to me today whether or not her shortness of breath is just do to COPD. Her chest x-ray recently showed evidence of pleural thickening versus pleural effusion with possible interstitial edema. Given her chronic leg swelling it is reasonable to get an echocardiogram to look for evidence of heart failure. I explained to her today that the differential diagnosis of shortness of breath is broad and includes heart disease, lung disease, hematologic disease, and neurologic disease. On physical exam she has crackles and wheezing which raises concern for both COPD and CHF. She does not appear to have neuromuscular weakness. I do not have a recent H&H.  Plan: -trial lasix x5 days then report back if improvement in symptoms. (check BMET today) -Obtain record of radiology report from her most recent (August 2015) chest x-ray -Spirometry and ambulatory oximetry measurement today -See COPD below -Check CBC and pro BNP -Echocardiogram

## 2014-04-23 NOTE — Patient Instructions (Signed)
We will arrange for you to have oxygen at home, use 2L when you exert yourself We will arrange an echocardiogram Use the Advair twice a day no matter how you feel Take lasix daily for the next 5 days We will see you back in 3 weeks or sooner if needed

## 2014-04-25 LAB — IGE: IGE (IMMUNOGLOBULIN E), SERUM: 10 kU/L (ref <115)

## 2014-04-25 NOTE — Progress Notes (Signed)
Quick Note:  lmtcb X1 to relay results. ______ 

## 2014-04-26 ENCOUNTER — Telehealth: Payer: Self-pay | Admitting: Pulmonary Disease

## 2014-04-26 NOTE — Telephone Encounter (Signed)
Pt aware of all lab results.  Nothing further needed.

## 2014-04-26 NOTE — Progress Notes (Signed)
Quick Note:  Pt aware of results and recs. Nothing further needed. ______

## 2014-04-26 NOTE — Progress Notes (Signed)
Quick Note:  Pt aware of results. Nothing further needed. ______ 

## 2014-04-26 NOTE — Telephone Encounter (Signed)
Message copied by Len Blalock on Fri Apr 26, 2014 10:53 AM ------      Message from: Simonne Maffucci B      Created: Wed Apr 24, 2014 10:25 PM       A,      Please let her know that this was normal      Thanks      B ------

## 2014-04-29 ENCOUNTER — Other Ambulatory Visit (HOSPITAL_COMMUNITY): Payer: Medicare Other

## 2014-05-01 ENCOUNTER — Ambulatory Visit (HOSPITAL_COMMUNITY): Payer: Medicare Other | Attending: Pulmonary Disease | Admitting: Radiology

## 2014-05-01 DIAGNOSIS — J9611 Chronic respiratory failure with hypoxia: Secondary | ICD-10-CM

## 2014-05-01 DIAGNOSIS — R0600 Dyspnea, unspecified: Secondary | ICD-10-CM

## 2014-05-01 DIAGNOSIS — Z87891 Personal history of nicotine dependence: Secondary | ICD-10-CM | POA: Diagnosis not present

## 2014-05-01 DIAGNOSIS — R0602 Shortness of breath: Secondary | ICD-10-CM | POA: Insufficient documentation

## 2014-05-01 DIAGNOSIS — I10 Essential (primary) hypertension: Secondary | ICD-10-CM | POA: Diagnosis not present

## 2014-05-01 DIAGNOSIS — E785 Hyperlipidemia, unspecified: Secondary | ICD-10-CM | POA: Insufficient documentation

## 2014-05-01 NOTE — Progress Notes (Signed)
Echocardiogram performed.  

## 2014-05-03 ENCOUNTER — Encounter: Payer: Self-pay | Admitting: Pulmonary Disease

## 2014-05-03 NOTE — Progress Notes (Signed)
Quick Note:  lmtcb X1 ______ 

## 2014-05-13 ENCOUNTER — Encounter: Payer: Self-pay | Admitting: Pulmonary Disease

## 2014-05-13 ENCOUNTER — Ambulatory Visit (INDEPENDENT_AMBULATORY_CARE_PROVIDER_SITE_OTHER): Payer: Medicare Other | Admitting: Pulmonary Disease

## 2014-05-13 VITALS — BP 136/74 | HR 86 | Ht 65.0 in | Wt 139.0 lb

## 2014-05-13 DIAGNOSIS — R0602 Shortness of breath: Secondary | ICD-10-CM

## 2014-05-13 DIAGNOSIS — J438 Other emphysema: Secondary | ICD-10-CM

## 2014-05-13 DIAGNOSIS — R0600 Dyspnea, unspecified: Secondary | ICD-10-CM

## 2014-05-13 DIAGNOSIS — J9611 Chronic respiratory failure with hypoxia: Secondary | ICD-10-CM

## 2014-05-13 MED ORDER — FUROSEMIDE 40 MG PO TABS: 40.0000 mg | ORAL_TABLET | Freq: Every day | ORAL | Status: DC

## 2014-05-13 NOTE — Assessment & Plan Note (Signed)
We will obtain full pulmonary function testing as well as a CT chest to see if there is another pulmonary explanation for her dyspnea. We will also obtain a stress test to look for possible cardiac etiology. Her echocardiogram was normal.

## 2014-05-13 NOTE — Assessment & Plan Note (Signed)
Continue 2L with exertion 

## 2014-05-13 NOTE — Progress Notes (Signed)
Quick Note:  Pt in office today, will review results in visit. ______

## 2014-05-13 NOTE — Progress Notes (Signed)
Subjective:    Patient ID: Rachel Walters, female    DOB: 02/22/1926, 80 y.o.   MRN: [id number]  Synopsis: GOLD grade D COPD first seen by pulmonary in 2015 04/2014 Echo> normal LVEF, grade 1 DD, normal RV and RVSP 04/23/2014 Spirometry> 56% FEV1 0.84L (45% pred) Started on 2 L oxygen with exertion and each bedtime in 2015  HPI Chief Complaint  Patient presents with  . Follow-up    review echocardiogram.  Pt c/o unchanged breathing since last visit.  States wearing the 02 at home with exertion does not change her SOB at all.    05/13/2014 ROV > Nil said that when she took the fluid pills her ankle size really went down a lot.  She can't remember if her breathing got worse or better when she was taking the lasix.  Overall her dyspnea hasn't improved much since the last visit and she continues to struggle with dyspnea with minimal activities.  She is OK at rest.  She is taking the Advair and thinks it helped a little at night.  Past Medical History  Diagnosis Date  . Arthritis   . COPD (chronic obstructive pulmonary disease)   . Hypertension   . Headache(784.0)   . Neuromuscular disorder   . Asthmatic bronchitis   . Esophageal reflux 05/19/2012  . Dyspnea 04/23/2014     Review of Systems  Constitutional: Positive for fatigue. Negative for fever and chills.  HENT: Negative for postnasal drip, rhinorrhea and sinus pressure.   Respiratory: Positive for shortness of breath. Negative for cough and wheezing.   Cardiovascular: Negative for chest pain, palpitations and leg swelling.       Objective:   Physical Exam  Filed Vitals:   05/13/14 1324  BP: 136/74  Pulse: 86  Height: 5\' 5"  (1.651 m)  Weight: 139 lb (63.05 kg)  SpO2: 95%  RA  Gen: well appearing, no acute distress HEENT: NCAT, EOMi, OP clear PULM: Crackles in R base CV: RRR, no mgr, no JVD AB: BS+, soft, nontender,  Ext: warm, trace edema, no clubbing, no cyanosis Derm: no rash or skin breakdown Neuro: A&Ox4  MAEW         Assessment & Plan:   COPD (chronic obstructive pulmonary disease) She has severe COPD which may be the only explanation for her shortness of breath. However, she is convinced that something has changed recently and feels that the maximum bronchodilator therapy has not helped.  Plan: -Continue Advair and Tudorza (sample of Advair given again) -continue prn albuterol  Dyspnea We will obtain full pulmonary function testing as well as a CT chest to see if there is another pulmonary explanation for her dyspnea. We will also obtain a stress test to look for possible cardiac etiology. Her echocardiogram was normal.  Chronic hypoxemic respiratory failure Continue 2 L with exertion    Updated Medication List Outpatient Encounter Prescriptions as of 05/13/2014  Medication Sig  . Aclidinium Bromide (TUDORZA PRESSAIR) 400 MCG/ACT AEPB Inhale 1 puff into the lungs 2 (two) times daily.  Marland Kitchen acyclovir (ZOVIRAX) 800 MG tablet Take 1 tablet by mouth daily.  Marland Kitchen amLODipine (NORVASC) 2.5 MG tablet Take by mouth daily.    . Calcium Carbonate-Vitamin D (CALCIUM 600+D) 600-200 MG-UNIT TABS Take 1 tablet by mouth daily.    . cholecalciferol (VITAMIN D) 1000 UNITS tablet Take 1,000 Units by mouth daily.    Marland Kitchen CLORAZEPATE DIPOTASSIUM PO Take 7.5 mg by mouth at bedtime.   . furosemide (LASIX) 40  MG tablet Take 1 tablet (40 mg total) by mouth daily.  . metoprolol (LOPRESSOR) 50 MG tablet Take 50 mg by mouth 2 (two) times daily.    . Multiple Vitamins-Minerals (CENTRUM SILVER ULTRA WOMENS) TABS Take 1 tablet by mouth daily.    Marland Kitchen olmesartan (BENICAR) 40 MG tablet Take 40 mg by mouth daily.  Marland Kitchen omeprazole (PRILOSEC) 40 MG capsule Take 1 capsule (40 mg total) by mouth 2 (two) times daily at 10 AM and 5 PM.  . pravastatin (PRAVACHOL) 10 MG tablet Take 10 mg by mouth daily.    Marland Kitchen PROAIR HFA 108 (90 BASE) MCG/ACT inhaler Inhale 2 puffs into the lungs every 6 (six) hours as needed.  . sucralfate  (CARAFATE) 1 GM/10ML suspension Take 10 mLs (1 g total) by mouth 2 (two) times daily.  . traMADol (ULTRAM) 50 MG tablet Take 50 mg by mouth every 6 (six) hours as needed. Pain.Marland KitchenMarland KitchenMarland KitchenMaximum dose= 8 tablets per day   . vitamin E 100 UNIT capsule Take 100 Units by mouth daily.    . [DISCONTINUED] furosemide (LASIX) 40 MG tablet Take 1 tablet (40 mg total) by mouth daily.

## 2014-05-13 NOTE — Assessment & Plan Note (Signed)
She has severe COPD which may be the only explanation for her shortness of breath. However, she is convinced that something has changed recently and feels that the maximum bronchodilator therapy has not helped.  Plan: -Continue Advair and Tudorza (sample of Advair given again) -continue prn albuterol

## 2014-05-13 NOTE — Patient Instructions (Signed)
We will order a pulmonary function test, CT scan and a stress test and will see you back in 1-2 weeks Take the lasix 40mg  daily for 5 days and let me know if that makes your shortness of breath any better

## 2014-05-14 ENCOUNTER — Telehealth: Payer: Self-pay | Admitting: Pulmonary Disease

## 2014-05-14 NOTE — Telephone Encounter (Signed)
LMTCB for pt to schedule appt with TP.

## 2014-05-15 NOTE — Telephone Encounter (Signed)
LMTCB

## 2014-05-15 NOTE — Telephone Encounter (Signed)
lmtcb x2 

## 2014-05-15 NOTE — Telephone Encounter (Signed)
Pt returning call.Rachel Walters ° °

## 2014-05-16 ENCOUNTER — Ambulatory Visit (INDEPENDENT_AMBULATORY_CARE_PROVIDER_SITE_OTHER): Payer: Medicare Other | Admitting: Pulmonary Disease

## 2014-05-16 DIAGNOSIS — R0602 Shortness of breath: Secondary | ICD-10-CM

## 2014-05-16 DIAGNOSIS — R0600 Dyspnea, unspecified: Secondary | ICD-10-CM

## 2014-05-16 LAB — PULMONARY FUNCTION TEST
DL/VA % PRED: 66 %
DL/VA: 3.21 ml/min/mmHg/L
DLCO unc % pred: 38 %
DLCO unc: 9.61 ml/min/mmHg
FEF 25-75 PRE: 0.46 L/s
FEF 25-75 Post: 0.6 L/sec
FEF2575-%Change-Post: 29 %
FEF2575-%Pred-Post: 53 %
FEF2575-%Pred-Pre: 41 %
FEV1-%CHANGE-POST: 6 %
FEV1-%PRED-PRE: 54 %
FEV1-%Pred-Post: 58 %
FEV1-POST: 1.03 L
FEV1-Pre: 0.96 L
FEV1FVC-%CHANGE-POST: 0 %
FEV1FVC-%Pred-Pre: 88 %
FEV6-%Change-Post: 6 %
FEV6-%PRED-POST: 69 %
FEV6-%Pred-Pre: 65 %
FEV6-Post: 1.56 L
FEV6-Pre: 1.47 L
FEV6FVC-%Change-Post: 0 %
FEV6FVC-%PRED-PRE: 103 %
FEV6FVC-%Pred-Post: 103 %
FVC-%Change-Post: 6 %
FVC-%Pred-Post: 67 %
FVC-%Pred-Pre: 63 %
FVC-Post: 1.6 L
FVC-Pre: 1.51 L
POST FEV1/FVC RATIO: 64 %
Post FEV6/FVC ratio: 98 %
Pre FEV1/FVC ratio: 64 %
Pre FEV6/FVC Ratio: 97 %
RV % pred: 90 %
RV: 2.31 L
TLC % pred: 78 %
TLC: 4.02 L

## 2014-05-16 NOTE — Telephone Encounter (Signed)
Called and spoke with pt and she has scheduled appt with TP on 10/30.  Nothing further is needed.

## 2014-05-16 NOTE — Progress Notes (Signed)
PFT done today. 

## 2014-05-17 ENCOUNTER — Encounter (HOSPITAL_COMMUNITY): Payer: Medicare Other

## 2014-05-17 ENCOUNTER — Other Ambulatory Visit: Payer: Self-pay | Admitting: Body Imaging

## 2014-05-17 ENCOUNTER — Ambulatory Visit (INDEPENDENT_AMBULATORY_CARE_PROVIDER_SITE_OTHER)
Admission: RE | Admit: 2014-05-17 | Discharge: 2014-05-17 | Disposition: A | Discharge status: Home / Self Care | Payer: Medicare Other | Source: Ambulatory Visit | Attending: Pulmonary Disease | Admitting: Pulmonary Disease

## 2014-05-17 DIAGNOSIS — R0602 Shortness of breath: Secondary | ICD-10-CM

## 2014-05-17 NOTE — Progress Notes (Signed)
Quick Note:  lmtcb X1 to relay results. ______ 

## 2014-05-22 ENCOUNTER — Telehealth: Payer: Self-pay | Admitting: Pulmonary Disease

## 2014-05-22 NOTE — Telephone Encounter (Signed)
Rachel Walters,   Please let her know that her PFT showed significant copd and emphysema.   Thanks   [name]  ----------- Pt aware of results, and per phone note from BQ needed a ROV to review CT chest.  Pt already scheduled with TP on Friday, could not come in to see BQ tomorrow.  Nothing further needed at this time.

## 2014-05-22 NOTE — Telephone Encounter (Signed)
Caryl Pina do you have something that needs to be told to the pt?  I didn't see anything in the chart?  Please advise. thanks

## 2014-05-22 NOTE — Progress Notes (Signed)
Quick Note:  Pt aware. Nothing further needed. ______

## 2014-05-22 NOTE — Progress Notes (Signed)
Quick Note:  appt already scheduled for 10/30 with TP. Could not come in Thursday with BQ. Nothing further needed. ______

## 2014-05-23 ENCOUNTER — Ambulatory Visit (HOSPITAL_COMMUNITY): Payer: Medicare Other | Attending: Pulmonary Disease | Admitting: Radiology

## 2014-05-23 VITALS — BP 143/58 | Ht 65.0 in | Wt 134.0 lb

## 2014-05-23 DIAGNOSIS — R002 Palpitations: Secondary | ICD-10-CM | POA: Diagnosis not present

## 2014-05-23 DIAGNOSIS — I10 Essential (primary) hypertension: Secondary | ICD-10-CM | POA: Insufficient documentation

## 2014-05-23 DIAGNOSIS — J449 Chronic obstructive pulmonary disease, unspecified: Secondary | ICD-10-CM | POA: Insufficient documentation

## 2014-05-23 DIAGNOSIS — R0602 Shortness of breath: Secondary | ICD-10-CM

## 2014-05-23 MED ORDER — TECHNETIUM TC 99M SESTAMIBI GENERIC - CARDIOLITE
33.0000 | Freq: Once | INTRAVENOUS | Status: AC | PRN
Start: 2014-05-23 — End: 2014-05-23
  Administered 2014-05-23: 33 via INTRAVENOUS

## 2014-05-23 MED ORDER — REGADENOSON 0.4 MG/5ML IV SOLN
0.4000 mg | Freq: Once | INTRAVENOUS | Status: AC
  Administered 2014-05-23: 0.4 mg via INTRAVENOUS

## 2014-05-23 MED ORDER — AMINOPHYLLINE 25 MG/ML IV SOLN
75.0000 mg | Freq: Once | INTRAVENOUS | Status: AC
  Administered 2014-05-23: 75 mg via INTRAVENOUS

## 2014-05-23 MED ORDER — TECHNETIUM TC 99M SESTAMIBI GENERIC - CARDIOLITE
11.0000 | Freq: Once | INTRAVENOUS | Status: AC | PRN
  Administered 2014-05-23: 11 via INTRAVENOUS

## 2014-05-23 NOTE — Progress Notes (Signed)
[facility] [address] [place], [place] [address] [phone]    Cardiology Nuclear Med Study  Rachel Walters is a 80 y.o. female     MRN : [id number]     DOB: 06/30/1926  Procedure Date: 05/23/2014  Nuclear Med Background Indication for Stress Test:  Evaluation for Ischemia History:  Asthma, COPD and Chronic hypoxic respiratory failure  Pt on O2 for extertion Cardiac Risk Factors: Hypertension  Symptoms:  DOE, Palpitations and SOB   Nuclear Pre-Procedure Caffeine/Decaff Intake:  7:00pm NPO After: 7:00pm   Lungs:  clear O2 Sat: 94% on room air. IV 0.9% NS with Angio Cath:  22g  IV Site: R Hand  IV Started by:  Matilde Haymaker, RN  Chest Size (in):  36 Cup Size: C  Height: 5\' 5"  (1.651 m)  Weight:  134 lb (60.782 kg)  BMI:  Body mass index is 22.3 kg/(m^2). Tech Comments:  No Lopressor x 24 hrs. Aminophylline 75 mg IV given for symptoms. All were resolved before leaving [place]    Nuclear Med Study 1 or 2 day study: 1 day  Stress Test Type:  Carlton Adam  Reading MD: n/a  Order Authorizing Provider:  Lemmie Evens  Resting Radionuclide: Technetium 27m Sestamibi  Resting Radionuclide Dose: 11.0 mCi   Stress Radionuclide:  Technetium 66m Sestamibi  Stress Radionuclide Dose: 33.0 mCi           Stress Protocol Rest HR: 84 Stress HR: 99  Rest BP: 143/58 Stress BP: 157/91  Exercise Time (min): n/a METS: n/a   Predicted Max HR: 134 bpm % Max HR: 73.88 bpm Rate Pressure Product: 15543   Dose of Adenosine (mg):  n/a Dose of Lexiscan: 0.4 mg  Dose of Atropine (mg): n/a Dose of Dobutamine: n/a mcg/kg/min (at max HR)  Stress Test Technologist: Perrin Maltese, EMT-P  Nuclear Technologist:  Earl Many, CNMT     Rest Procedure:  Myocardial perfusion imaging was performed at rest 45 minutes following the intravenous administration of Technetium 68m Sestamibi. Rest ECG: NSR - Normal EKG  Stress Procedure:  The patient received  IV Lexiscan 0.4 mg over 15-seconds.  Technetium 76m Sestamibi injected at 30-seconds. This patient had sob with the Lexiscan injection. Quantitative spect images were obtained after a 45 minute delay. Stress ECG: No significant change from baseline ECG  QPS Raw Data Images:  Normal; no motion artifact; normal heart/lung ratio. Stress Images:  Normal homogeneous uptake in all areas of the myocardium. Rest Images:  Normal homogeneous uptake in all areas of the myocardium. Subtraction (SDS):  There is no evidence of scar or ischemia. Transient Ischemic Dilatation (Normal <1.22):  0.99 Lung/Heart Ratio (Normal <0.45):  0.30  Quantitative Gated Spect Images QGS EDV:  47 ml QGS ESV:  13 ml  Impression Exercise Capacity:  Lexiscan with no exercise. BP Response:  Normal blood pressure response. Clinical Symptoms:  Dyspnea ECG Impression:  No significant ST segment change suggestive of ischemia. Comparison with Prior Nuclear Study: No previous nuclear study performed  Overall Impression:  Normal stress nuclear study.  LV Ejection Fraction: 72%.  LV Wall Motion:  NL LV Function; NL Wall Motion  [name] 05/23/2014

## 2014-05-24 ENCOUNTER — Encounter: Payer: Self-pay | Admitting: Adult Health

## 2014-05-24 ENCOUNTER — Ambulatory Visit (INDEPENDENT_AMBULATORY_CARE_PROVIDER_SITE_OTHER): Payer: Medicare Other | Admitting: Adult Health

## 2014-05-24 VITALS — BP 132/68 | HR 94 | Ht 65.0 in | Wt 137.0 lb

## 2014-05-24 DIAGNOSIS — J849 Interstitial pulmonary disease, unspecified: Secondary | ICD-10-CM

## 2014-05-24 DIAGNOSIS — R0600 Dyspnea, unspecified: Secondary | ICD-10-CM

## 2014-05-24 DIAGNOSIS — R9389 Abnormal findings on diagnostic imaging of other specified body structures: Secondary | ICD-10-CM

## 2014-05-24 DIAGNOSIS — J9611 Chronic respiratory failure with hypoxia: Secondary | ICD-10-CM

## 2014-05-24 DIAGNOSIS — R938 Abnormal findings on diagnostic imaging of other specified body structures: Secondary | ICD-10-CM

## 2014-05-24 DIAGNOSIS — J438 Other emphysema: Secondary | ICD-10-CM

## 2014-05-24 DIAGNOSIS — R0602 Shortness of breath: Secondary | ICD-10-CM

## 2014-05-24 DIAGNOSIS — R911 Solitary pulmonary nodule: Secondary | ICD-10-CM

## 2014-05-24 HISTORY — DX: Interstitial pulmonary disease, unspecified: J84.9

## 2014-05-24 MED ORDER — FLUTICASONE-SALMETEROL 250-50 MCG/DOSE IN AEPB: 1.0000 | INHALATION_SPRAY | Freq: Two times a day (BID) | RESPIRATORY_TRACT | Status: DC

## 2014-05-24 MED ORDER — ACLIDINIUM BROMIDE 400 MCG/ACT IN AEPB: 1.0000 | INHALATION_SPRAY | Freq: Two times a day (BID) | RESPIRATORY_TRACT | Status: DC

## 2014-05-24 NOTE — Patient Instructions (Signed)
Wear Oxygen 2l/m with walking and At bedtime   Continue on Tudorza 1 puff Twice daily   Continue on Advair 1 puff Twice daily   We are setting you up for a PET scan .  Follow up Dr. Lake Bells in 3-4 weeks and As needed

## 2014-05-24 NOTE — Progress Notes (Signed)
I agree with this plan.

## 2014-05-24 NOTE — Progress Notes (Signed)
   Subjective:    Patient ID: Rachel Walters, female    DOB: [birth year], 80 y.o.   MRN: [id number]  HPI GOLD grade D COPD first seen by pulmonary in 2015 04/2014 Echo> normal LVEF, grade 1 DD, normal RV and RVSP 04/23/2014 Spirometry> 56% FEV1 0.84L (45% pred) Started on 2 L oxygen with exertion and each bedtime in 2015 PFT 05/16/14 PFT on 10/22 showed FEV1 54%, ratio 64, FVC 63, Diffusing capacity 38%. (recent nml hbg) . No BD response.    05/24/2014 Follow up /COPD  Patient returns for two-week follow-up. Last visit, patient was given Lasix daily for 5 days for possible fluid overload. Patient did not notice much change in her breathing . Weight is down 2 pounds. Patient was set up for a pulmonary function test , CT chest and nuclear card stress test .  PFT on 10/22 showed FEV1 54%, ratio 64, FVC 63, Diffusing capacity 38%. (recent nml hbg) . No BD response.  Underwent nuclear stress test was nml , with nml EF 72%.  CT chest shows some patchy areas of groundglass attenuation and peripheral subpleural reticulation that has slightly increased from last exam . This could reflect mild nonspecific interstitial pneumonia, there was enlargement of 2 sub-solid nodules in the right lung, the largest measuring 2.3 x 1.3 cm with a central solid component measuring at least 1 cm this was concerning for a possible slow growing malignancy. Since last visit. Patient states that she feels about the same. She denies any hemoptysis, orthopnea, PND or increased leg swelling. We reviewed all her above test results.    Review of Systems Constitutional:   No  weight loss, night sweats,  Fevers, chills,  +fatigue, or  lassitude.  HEENT:   No headaches,  Difficulty swallowing,  Tooth/dental problems, or  Sore throat,                No sneezing, itching, ear ache, nasal congestion, post nasal drip,   CV:  No chest pain,  Orthopnea, PND, swelling in lower extremities, anasarca, dizziness, palpitations, syncope.     GI  No heartburn, indigestion, abdominal pain, nausea, vomiting, diarrhea, change in bowel habits, loss of appetite, bloody stools.   Resp:    No chest wall deformity  Skin: no rash or lesions.  GU: no dysuria, change in color of urine, no urgency or frequency.  No flank pain, no hematuria   MS:  No joint pain or swelling.  No decreased range of motion.  No back pain.  Psych:  No change in mood or affect. No depression or anxiety.  No memory loss.         Objective:   Physical Exam GEN: A/Ox3; pleasant , NAD, elderly   HEENT:  [place]/AT,  EACs-clear, TMs-wnl, NOSE-clear, THROAT-clear, no lesions, no postnasal drip or exudate noted.   NECK:  Supple w/ fair ROM; no JVD; normal carotid impulses w/o bruits; no thyromegaly or nodules palpated; no lymphadenopathy.  RESP  Decreased BS in bases .no accessory muscle use, no dullness to percussion  CARD:  RRR, no m/r/g  , no peripheral edema, pulses intact, no cyanosis or clubbing.  GI:   Soft & nt; nml bowel sounds; no organomegaly or masses detected.  Musco: Warm bil, no deformities or joint swelling noted.   Neuro: alert, no focal deficits noted.    Skin: Warm, no lesions or rashes         Assessment & Plan:

## 2014-05-24 NOTE — Assessment & Plan Note (Signed)
CT chest shows some patchy areas of groundglass attenuation and peripheral subpleural reticulation that has slightly increased from last exam . This could reflect mild nonspecific interstitial pneumonia, there was enlargement of 2 sub-solid nodules in the right lung, the largest measuring 2.3 x 1.3 cm with a central solid component measuring at least 1 cm this was concerning for a possible slow growing malignancy.  Abnormal CT in smoker  Will set up for PET scan

## 2014-05-24 NOTE — Assessment & Plan Note (Signed)
Suspect secondary to COPD  Echo and stress test are ok.

## 2014-05-24 NOTE — Assessment & Plan Note (Signed)
Stable without flare   Plan  Wear Oxygen 2l/m with walking and At bedtime   Continue on Tudorza 1 puff Twice daily   Continue on Advair 1 puff Twice daily   Follow up Dr. Lake Bells in 3-4 weeks and As needed

## 2014-05-27 ENCOUNTER — Ambulatory Visit: Payer: Medicare Other | Admitting: Pulmonary Disease

## 2014-05-28 ENCOUNTER — Ambulatory Visit (HOSPITAL_COMMUNITY)
Admission: RE | Admit: 2014-05-28 | Discharge: 2014-05-28 | Disposition: A | Discharge status: Home / Self Care | Payer: Medicare Other | Source: Ambulatory Visit | Attending: Diagnostic Radiology | Admitting: Diagnostic Radiology

## 2014-05-28 DIAGNOSIS — K119 Disease of salivary gland, unspecified: Secondary | ICD-10-CM | POA: Diagnosis not present

## 2014-05-28 DIAGNOSIS — R911 Solitary pulmonary nodule: Secondary | ICD-10-CM | POA: Insufficient documentation

## 2014-05-28 DIAGNOSIS — R918 Other nonspecific abnormal finding of lung field: Secondary | ICD-10-CM | POA: Diagnosis not present

## 2014-05-28 LAB — GLUCOSE, CAPILLARY: GLUCOSE-CAPILLARY: 106 mg/dL — AB (ref 70–99)

## 2014-05-28 MED ORDER — FLUDEOXYGLUCOSE F - 18 (FDG) INJECTION
6.7400 | Freq: Once | INTRAVENOUS | Status: AC | PRN
  Administered 2014-05-28: 6.74 via INTRAVENOUS

## 2014-05-29 NOTE — Progress Notes (Signed)
Quick Note:  Pt aware of results. ______ 

## 2014-06-07 ENCOUNTER — Telehealth: Payer: Self-pay

## 2014-06-07 DIAGNOSIS — K118 Other diseases of salivary glands: Secondary | ICD-10-CM

## 2014-06-07 NOTE — Progress Notes (Signed)
Quick Note:  ent referral ordered. Nothing further needed. ______

## 2014-06-07 NOTE — Telephone Encounter (Signed)
ent referral scheduled.  Nothing further needed.

## 2014-06-07 NOTE — Telephone Encounter (Signed)
-----   Message from Melvenia Needles, NP sent at 06/06/2014  6:18 PM EST ----- Spoke with pt regarding results 05/31/14 Spoke with DR. McQuaid , pt will discuss in detain on return ov  06/24/14  Refer to ENT for parotid abnormality (ENT referral for  right parotid nodule/hypermetabolic)

## 2014-06-24 ENCOUNTER — Encounter: Payer: Self-pay | Admitting: Pulmonary Disease

## 2014-06-24 ENCOUNTER — Ambulatory Visit (INDEPENDENT_AMBULATORY_CARE_PROVIDER_SITE_OTHER): Payer: Medicare Other | Admitting: Pulmonary Disease

## 2014-06-24 ENCOUNTER — Other Ambulatory Visit (INDEPENDENT_AMBULATORY_CARE_PROVIDER_SITE_OTHER): Payer: Medicare Other

## 2014-06-24 VITALS — BP 138/84 | HR 98 | Ht 65.0 in | Wt 136.0 lb

## 2014-06-24 DIAGNOSIS — J849 Interstitial pulmonary disease, unspecified: Secondary | ICD-10-CM

## 2014-06-24 DIAGNOSIS — R918 Other nonspecific abnormal finding of lung field: Secondary | ICD-10-CM

## 2014-06-24 DIAGNOSIS — R911 Solitary pulmonary nodule: Secondary | ICD-10-CM | POA: Insufficient documentation

## 2014-06-24 LAB — SEDIMENTATION RATE: SED RATE: 12 mm/h (ref 0–22)

## 2014-06-24 MED ORDER — PREDNISONE 10 MG PO TABS: ORAL_TABLET | ORAL | Status: DC

## 2014-06-24 MED ORDER — FLUTICASONE-SALMETEROL 250-50 MCG/DOSE IN AEPB: 1.0000 | INHALATION_SPRAY | Freq: Two times a day (BID) | RESPIRATORY_TRACT | Status: DC

## 2014-06-24 NOTE — Assessment & Plan Note (Signed)
I have reviewed the images of both the CT chest as well as the PET scan. The radiologist is concerned that she has developed pulmonary fibrosis, specifically nonspecific interstitial pneumonitis. She does not have a history which is consistent with a connective tissue disease which is often associated with this condition. Given the basilar predominance of the changes I do consider chronic aspiration. Interestingly she does have a history of some esophageal problems in the past. I explained to her today that the only way to figure out for certain what's going on here would be to perform an open lung biopsy but I do not recommend that considering the degree of symptoms she is experiencing and her advanced age.  Plan: -Check serology for underlying connective tissue disease -Plan 3 months course of moderate to low-dose prednisone: Start 30 mg daily for 1 month, then 20 mg daily for 1 month, then 10 mg daily -Repeat pulmonary function testing in 3 months

## 2014-06-24 NOTE — Assessment & Plan Note (Signed)
I have personally reviewed the images from the recent CT chest and PET/CT. She does have a concerning, 2 cm nodule in the right upper lobe which was not FDG avid. However, it has grown slightly. I explained to her today that even though the PET scan was negative she could still have a slow growing malignancy such as adenocarcinoma. I also explained to her that the only way to know for certain what we are dealing with would be to perform a biopsy. She is not interested in having a biopsy of any kind performed today. We discussed various options including navigational bronchoscopy, transthoracic needle aspiration, and finally surgical lung biopsy. She understands that the risks of waiting and following a serial imaging protocol alone could mean that she has a malignancy which grows unnecessarily. However, she does not want to undergo any sort of surgical procedure right now.  Plan: -Repeat CT chest April 2016.

## 2014-06-24 NOTE — Assessment & Plan Note (Signed)
She has moderate COPD, but I do not believe that that is the explanation for her recent decline in shortness of breath. Continue Tudorza and Advair.

## 2014-06-24 NOTE — Progress Notes (Signed)
Subjective:    Patient ID: Rachel Walters, female    DOB: 05-06-1926, 80 y.o.   MRN: [id number]  Synopsis: GOLD grade D COPD first seen by pulmonary in 2015 04/2014 Echo> normal LVEF, grade 1 DD, normal RV and RVSP 04/23/2014 Spirometry> 56% FEV1 0.84L (45% pred) Started on 2 L oxygen with exertion and each bedtime in 2015  HPI  Chief Complaint  Patient presents with  . Follow-up    Pt c/o stable sob with exertion, prod cough with gray mucus worse at night.  Saw ent since having pet scan.    06/24/2014 ROV > [name] says that she is not doing any better and she said today is worse with the cold weather.  She continues to cough and has been producing grey mucus, specifically at night  She says that her oxygen level is low in the mornings when she first wakes up ant her oxygen leve has been as low as 82% in the mornings while still wearing her oxygen. She previously had trouble swallowing but [name] has improved some.  She sometimes has trouble getting pills down but food goes down OK.   The ear nose and throat doctors did not feel that she needed to have a biopsy of the parotid nodule.  Past Medical History  Diagnosis Date  . Arthritis   . COPD (chronic obstructive pulmonary disease)   . Hypertension   . Headache(784.0)   . Neuromuscular disorder   . Asthmatic bronchitis   . Esophageal reflux 05/19/2012  . Dyspnea 04/23/2014     Review of Systems  Constitutional: Positive for fatigue. Negative for fever and chills.  HENT: Negative for postnasal drip, rhinorrhea and sinus pressure.   Respiratory: Positive for shortness of breath. Negative for cough and wheezing.   Cardiovascular: Negative for chest pain, palpitations and leg swelling.       Objective:   Physical Exam   Filed Vitals:   06/24/14 1402  BP: 138/84  Pulse: 98  Height: 5\' 5"  (1.651 m)  Weight: 136 lb (61.689 kg)  SpO2: 97%  RA  Gen: well appearing, no acute distress HEENT: NCAT, EOMi, OP clear PULM: Crackles  in bases bilaterally CV: RRR, no mgr, no JVD AB: BS+, soft, nontender,  Ext: warm, trace edema, no clubbing, no cyanosis Derm: no rash or skin breakdown Neuro: A&Ox4 MAEW   04/2014 >CT chest shows some patchy areas of groundglass attenuation and peripheral subpleural reticulation that has slightly increased from last exam . This could reflect mild nonspecific interstitial pneumonia, there was enlargement of 2 sub-solid nodules in the right lung, the largest measuring 2.3 x 1.3 cm with a central solid component measuring at least 1 cm this was concerning for a possible slow growing malignancy. PET scan 05/28/14 >>The right upper and right lower lobe sub solid pulmonary nodules are not significantly hypermetabolic. Although this is reassuring,low-grade adenocarcinoma remains a possibility. Per consensus criteria, given the size of the soft tissue component of the rightupper lobe nodule, tissue sampling or resection should be considered. If this is not performed, CT followup at approximately 70months suggested. 2. Right parotid deep hypermetabolism, possibly corresponding to a 20mm nodule. This could represent a lymph node or a parotid neoplasm.      Assessment & Plan:   ILD (interstitial lung disease) I have reviewed the images of both the CT chest as well as the PET scan. The radiologist is concerned that she has developed pulmonary fibrosis, specifically nonspecific interstitial pneumonitis. She does not  have a history which is consistent with a connective tissue disease which is often associated with this condition. Given the basilar predominance of the changes I do consider chronic aspiration. Interestingly she does have a history of some esophageal problems in the past. I explained to her today that the only way to figure out for certain what's going on here would be to perform an open lung biopsy but I do not recommend that considering the degree of symptoms she is experiencing and her advanced  age.  Plan: -Check serology for underlying connective tissue disease -Plan 3 months course of moderate to low-dose prednisone: Start 30 mg daily for 1 month, then 20 mg daily for 1 month, then 10 mg daily -Repeat pulmonary function testing in 3 months  COPD (chronic obstructive pulmonary disease) She has moderate COPD, but I do not believe that that is the explanation for her recent decline in shortness of breath. Continue Tudorza and Advair.  Pulmonary nodules I have personally reviewed the images from the recent CT chest and PET/CT. She does have a concerning, 2 cm nodule in the right upper lobe which was not FDG avid. However, it has grown slightly. I explained to her today that even though the PET scan was negative she could still have a slow growing malignancy such as adenocarcinoma. I also explained to her that the only way to know for certain what we are dealing with would be to perform a biopsy. She is not interested in having a biopsy of any kind performed today. We discussed various options including navigational bronchoscopy, transthoracic needle aspiration, and finally surgical lung biopsy. She understands that the risks of waiting and following a serial imaging protocol alone could mean that she has a malignancy which grows unnecessarily. However, she does not want to undergo any sort of surgical procedure right now.  Plan: -Repeat CT chest April 2016.    Updated Medication List Outpatient Encounter Prescriptions as of 06/24/2014  Medication Sig  . Aclidinium Bromide (TUDORZA PRESSAIR) 400 MCG/ACT AEPB Inhale 1 puff into the lungs 2 (two) times daily.  Marland Kitchen amLODipine (NORVASC) 2.5 MG tablet Take by mouth daily.    . Calcium Carbonate-Vitamin D (CALCIUM 600+D) 600-200 MG-UNIT TABS Take 1 tablet by mouth daily.    . cholecalciferol (VITAMIN D) 1000 UNITS tablet Take 1,000 Units by mouth daily.    Marland Kitchen CLORAZEPATE DIPOTASSIUM PO Take 7.5 mg by mouth at bedtime.   .  Fluticasone-Salmeterol (ADVAIR) 250-50 MCG/DOSE AEPB Inhale 1 puff into the lungs every 12 (twelve) hours.  . metoprolol (LOPRESSOR) 50 MG tablet Take 50 mg by mouth 2 (two) times daily.    . Multiple Vitamins-Minerals (CENTRUM SILVER ULTRA WOMENS) TABS Take 1 tablet by mouth daily.    Marland Kitchen olmesartan (BENICAR) 40 MG tablet Take 40 mg by mouth daily.  . pravastatin (PRAVACHOL) 10 MG tablet Take 10 mg by mouth daily.    Marland Kitchen PROAIR HFA 108 (90 BASE) MCG/ACT inhaler Inhale 2 puffs into the lungs every 6 (six) hours as needed.  . traMADol (ULTRAM) 50 MG tablet Take 50 mg by mouth every 6 (six) hours as needed. Pain.Marland KitchenMarland KitchenMarland KitchenMaximum dose= 8 tablets per day   . vitamin E 100 UNIT capsule Take 100 Units by mouth daily.    . [DISCONTINUED] Fluticasone-Salmeterol (ADVAIR) 250-50 MCG/DOSE AEPB Inhale 1 puff into the lungs every 12 (twelve) hours.  . [DISCONTINUED] sucralfate (CARAFATE) 1 GM/10ML suspension Take 10 mLs (1 g total) by mouth 2 (two) times daily.  . predniSONE (  DELTASONE) 10 MG tablet 30mg  daily for a month, then 20mg  daily for a month, then 10 mg daily for a month  . [DISCONTINUED] acyclovir (ZOVIRAX) 800 MG tablet Take 1 tablet by mouth daily.  . [DISCONTINUED] furosemide (LASIX) 40 MG tablet Take 1 tablet (40 mg total) by mouth daily. (Patient not taking: Reported on 06/24/2014)  . [DISCONTINUED] omeprazole (PRILOSEC) 40 MG capsule Take 1 capsule (40 mg total) by mouth 2 (two) times daily at 10 AM and 5 PM. (Patient not taking: Reported on 06/24/2014)

## 2014-06-24 NOTE — Patient Instructions (Signed)
Keep taking your inhalers as you are doing STart taking your prednisone as directed: 30mg  daily for a month, 20mg  daily for a month, then 10mg  daily for a month We will arrange a barium swallow We will see you back in 3 months or sooner if needed

## 2014-06-25 LAB — ANTI-JO 1 ANTIBODY, IGG: Anti JO-1: <0.2 AI (ref 0.0–0.9)

## 2014-06-25 LAB — SJOGRENS SYNDROME-B EXTRACTABLE NUCLEAR ANTIBODY: SSB (LA) (ENA) ANTIBODY, IGG: NEGATIVE

## 2014-06-25 LAB — CENTROMERE ANTIBODIES: CENTROMERE AB SCREEN: NEGATIVE

## 2014-06-25 LAB — ANA: Anti Nuclear Antibody(ANA): NEGATIVE

## 2014-06-25 LAB — RHEUMATOID FACTOR: Rhuematoid fact SerPl-aCnc: <10 IU/mL (ref <=14)

## 2014-06-25 LAB — ANTI-SCLERODERMA ANTIBODY: Scleroderma (Scl-70) (ENA) Antibody, IgG: <1

## 2014-06-25 LAB — SJOGRENS SYNDROME-A EXTRACTABLE NUCLEAR ANTIBODY: SSA (Ro) (ENA) Antibody, IgG: <1

## 2014-06-25 LAB — CYCLIC CITRUL PEPTIDE ANTIBODY, IGG

## 2014-06-25 LAB — C-REACTIVE PROTEIN: CRP: <0.5 mg/dL (ref <0.60)

## 2014-06-26 LAB — ALDOLASE: ALDOLASE: 6.1 U/L (ref <=8.1)

## 2014-07-04 ENCOUNTER — Ambulatory Visit (HOSPITAL_COMMUNITY)
Admission: RE | Admit: 2014-07-04 | Discharge: 2014-07-04 | Disposition: A | Discharge status: Home / Self Care | Payer: Medicare Other | Source: Ambulatory Visit | Attending: Pulmonary Disease | Admitting: Pulmonary Disease

## 2014-07-04 DIAGNOSIS — K224 Dyskinesia of esophagus: Secondary | ICD-10-CM | POA: Insufficient documentation

## 2014-07-04 DIAGNOSIS — J849 Interstitial pulmonary disease, unspecified: Secondary | ICD-10-CM | POA: Insufficient documentation

## 2014-07-04 DIAGNOSIS — R1310 Dysphagia, unspecified: Secondary | ICD-10-CM | POA: Diagnosis present

## 2014-07-10 ENCOUNTER — Telehealth: Payer: Self-pay | Admitting: Pulmonary Disease

## 2014-07-10 DIAGNOSIS — R1314 Dysphagia, pharyngoesophageal phase: Secondary | ICD-10-CM

## 2014-07-10 NOTE — Telephone Encounter (Signed)
-----   Message from Juanito Doom, MD sent at 07/08/2014  3:24 PM EST ----- A, Please let her know that her barium swallow showed that her esophagus doesn't squeeze food through to her stomach very well.  I would like for her to see a gastrotenterologist to discuss this further.  Thanks B

## 2014-07-10 NOTE — Telephone Encounter (Signed)
Pt aware of lab and barium swallow results.  Pt saw Dr. Olevia Perches upstairs back in 2013 and would prefer to stick with her if possible.  Referral sent.  Nothing further needed at this time.

## 2014-07-10 NOTE — Progress Notes (Signed)
Quick Note:  Pt aware of results. Referral made. Nothing further needed. ______

## 2014-07-10 NOTE — Progress Notes (Signed)
Quick Note:  atc pt, line rang several times then went to fast busy signal. wcb ______

## 2014-07-15 ENCOUNTER — Encounter: Payer: Self-pay | Admitting: *Deleted

## 2014-07-29 ENCOUNTER — Telehealth: Payer: Self-pay | Admitting: Pulmonary Disease

## 2014-07-29 NOTE — Telephone Encounter (Signed)
Called, spoke with pt.  Reports she is having a "real hard time getting my breath the past few days."  C/o increased SOB with talking and with very little activity.  Pt reports she has to stop talking to catch breath at times over the past few days -- when pt first answered phone, she was SOB; needing to pause to complete sentences, and reports she "just got out of the bed."  After talking a few minutes, the SOB seemed to have improved as pt was able to complete sentences without needing to pause. Also reports increased wheezing, "terrible" HA, chest tightness, and temp 99.9.  States she has a cough with gray mucus which is unchanged.  Using Advair 250 1 puff bid which does help and has used proair hfa with not much relief.  Pt uses o2 qhs but reports she has needed to use this "all the time now."  I offered pt appt today but pt declined d/t transportation issues.  OV scheduled with Dr. Gwenette Greet for tomorrow at 11:45 am -- pt confirmed appt and is aware she should seek emergency care if needed.    Dr. Lake Bells, pt would like to know if she should start on anything tonight to help with symptoms.  Please advise.  Thank you.  Rachel Walters

## 2014-07-29 NOTE — Telephone Encounter (Signed)
She really needs to be seen.  I agree with the plan outlined by [employer]

## 2014-07-29 NOTE — Telephone Encounter (Signed)
Pt advised and will keep appt for tomorrow. Shannon Bing, CMA

## 2014-07-29 NOTE — Telephone Encounter (Signed)
lmomtcb for pt to advise of below per BQ

## 2014-07-30 ENCOUNTER — Ambulatory Visit (INDEPENDENT_AMBULATORY_CARE_PROVIDER_SITE_OTHER): Payer: Medicare Other | Admitting: Pulmonary Disease

## 2014-07-30 ENCOUNTER — Encounter: Payer: Self-pay | Admitting: Pulmonary Disease

## 2014-07-30 VITALS — BP 130/70 | HR 90 | Temp 98.1°F | Ht 65.0 in | Wt 135.8 lb

## 2014-07-30 DIAGNOSIS — J849 Interstitial pulmonary disease, unspecified: Secondary | ICD-10-CM

## 2014-07-30 DIAGNOSIS — J438 Other emphysema: Secondary | ICD-10-CM

## 2014-07-30 MED ORDER — METHYLPREDNISOLONE 16 MG PO TABS: ORAL_TABLET | ORAL | Status: DC

## 2014-07-30 NOTE — Patient Instructions (Signed)
Will start on medrol which is often better tolerated than prednisone.  Take 16mg  tablets, 2 each morning on a full stomach until seen back.  Let us know if poorly tolerated. Will increase your oxygen to 3 liters while sleeping, and check your oxygen level again overnight. Can use saline spray to help clear out nose if congested. followup with Dr. Lake Bells in 2 weeks.

## 2014-07-30 NOTE — Assessment & Plan Note (Signed)
From her description, I do not think she is having an acute exacerbation. She does not have evidence for bronchospasm on lung exam.

## 2014-07-30 NOTE — Progress Notes (Signed)
   Subjective:    Patient ID: Rachel Walters, female    DOB: 1925/09/16, 81 y.o.   MRN: [id number]  HPI  The patient comes in today for an acute sick visit. She is normally followed by Dr. Lake Bells for COPD as interstitial lung disease. At the last visit, she was started on a prednisone taper to see if this would help her symptoms of dyspnea, but unfortunately the patient only took for a few days before discontinuing because of insomnia. She notes that she has been having episodes of shortness of breath at night, and that her oxygen saturations have been in the 80s despite having oxygen. This primarily occurs when she gets up and goes to the bathroom, but it takes quite a bit of time for her to get her breath back. She denies any significant cough, chest congestion, or purulent mucus. Does have some sinus congestion and pressure along with postnasal drip, but she is not having purulent secretions.   Review of Systems  Constitutional: Negative for fever and unexpected weight change.  HENT: Positive for congestion, postnasal drip and sinus pressure. Negative for dental problem, ear pain, nosebleeds, sneezing, sore throat and trouble swallowing.   Eyes: Negative for redness and itching.  Respiratory: Positive for cough, chest tightness and shortness of breath. Negative for wheezing.   Cardiovascular: Negative for palpitations and leg swelling.  Gastrointestinal: Negative for nausea and vomiting.  Genitourinary: Negative for dysuria.  Musculoskeletal: Negative for joint swelling.  Skin: Negative for rash.  Neurological: Negative for headaches.  Hematological: Does not bruise/bleed easily.  Psychiatric/Behavioral: Negative for dysphoric mood. The patient is not nervous/anxious.        Objective:   Physical Exam Thin female in no acute distress Nose without purulence or discharge noted Neck without lymphadenopathy or thyromegaly Chest with decreased breath sounds diffusely, no active wheezing,  basilar crackles Cardiac exam with distant heart sounds, but regular Lower extremities without significant edema, no cyanosis Alert and oriented, moves all 4 extremities.       Assessment & Plan:

## 2014-07-30 NOTE — Assessment & Plan Note (Signed)
The patient was prescribed prednisone at the last visit, and unfortunately she discontinued after a few days because of issues with insomnia. I would like to get her back on a trial of steroids, but we'll see if she tolerates Medrol better. Will start her on 32 mg a day, and have her follow-up with Dr. Lake Bells in 2 weeks. She is to call if she feels this is poorly tolerated. The patient also is having issues with shortness of breath and low oxygen saturations at night. From her history, it sounds like she is on continuous rather than pulsed oxygen, but her level may not be adequate. Will increase her flow to 3 L, and arrange for overnight oximetry.

## 2014-08-15 ENCOUNTER — Telehealth: Payer: Self-pay | Admitting: Pulmonary Disease

## 2014-08-15 MED ORDER — METHYLPREDNISOLONE 16 MG PO TABS: ORAL_TABLET | ORAL | Status: DC

## 2014-08-15 NOTE — Telephone Encounter (Signed)
Please refill.

## 2014-08-15 NOTE — Telephone Encounter (Signed)
Pt aware okay to refill. RX sent in

## 2014-08-15 NOTE — Telephone Encounter (Signed)
Pt seen Dr Gwenette Greet on 07/30/14 with following instructions:   Will start on medrol which is often better tolerated than prednisone.  Take 16mg  tablets, 2 each morning on a full stomach until seen back.  Let us know if poorly tolerated. Will increase your oxygen to 3 liters while sleeping, and check your oxygen level again overnight. Can use saline spray to help clear out nose if congested. followup with Dr. Lake Bells in 2 weeks.    Pt canceled appt tomorrow with BQ due to weather.  Next available was 09/09/14.  Pt will be out of Medrol.  Please advise if ok to refill until f/u ov.

## 2014-08-16 ENCOUNTER — Ambulatory Visit: Payer: Medicare Other | Admitting: Pulmonary Disease

## 2014-08-20 ENCOUNTER — Ambulatory Visit: Payer: Medicare Other | Admitting: Internal Medicine

## 2014-09-01 ENCOUNTER — Inpatient Hospital Stay (HOSPITAL_COMMUNITY)
Admission: EM | Admit: 2014-09-01 | Discharge: 2014-09-09 | DRG: 871 | Disposition: A | Discharge status: Skilled Nursing Facility | Payer: Medicare Other | Attending: Internal Medicine | Admitting: Internal Medicine

## 2014-09-01 ENCOUNTER — Emergency Department (HOSPITAL_COMMUNITY): Payer: Medicare Other

## 2014-09-01 ENCOUNTER — Encounter (HOSPITAL_COMMUNITY): Payer: Self-pay | Admitting: Emergency Medicine

## 2014-09-01 DIAGNOSIS — D7589 Other specified diseases of blood and blood-forming organs: Secondary | ICD-10-CM | POA: Diagnosis present

## 2014-09-01 DIAGNOSIS — Z7901 Long term (current) use of anticoagulants: Secondary | ICD-10-CM

## 2014-09-01 DIAGNOSIS — I82401 Acute embolism and thrombosis of unspecified deep veins of right lower extremity: Secondary | ICD-10-CM | POA: Diagnosis present

## 2014-09-01 DIAGNOSIS — R509 Fever, unspecified: Secondary | ICD-10-CM

## 2014-09-01 DIAGNOSIS — J189 Pneumonia, unspecified organism: Secondary | ICD-10-CM

## 2014-09-01 DIAGNOSIS — I10 Essential (primary) hypertension: Secondary | ICD-10-CM | POA: Diagnosis present

## 2014-09-01 DIAGNOSIS — R6510 Systemic inflammatory response syndrome (SIRS) of non-infectious origin without acute organ dysfunction: Secondary | ICD-10-CM | POA: Diagnosis present

## 2014-09-01 DIAGNOSIS — I82409 Acute embolism and thrombosis of unspecified deep veins of unspecified lower extremity: Secondary | ICD-10-CM

## 2014-09-01 DIAGNOSIS — K219 Gastro-esophageal reflux disease without esophagitis: Secondary | ICD-10-CM | POA: Diagnosis present

## 2014-09-01 DIAGNOSIS — Z79899 Other long term (current) drug therapy: Secondary | ICD-10-CM

## 2014-09-01 DIAGNOSIS — K224 Dyskinesia of esophagus: Secondary | ICD-10-CM | POA: Diagnosis present

## 2014-09-01 DIAGNOSIS — J449 Chronic obstructive pulmonary disease, unspecified: Secondary | ICD-10-CM | POA: Diagnosis present

## 2014-09-01 DIAGNOSIS — Z87891 Personal history of nicotine dependence: Secondary | ICD-10-CM

## 2014-09-01 DIAGNOSIS — R0600 Dyspnea, unspecified: Secondary | ICD-10-CM | POA: Diagnosis not present

## 2014-09-01 DIAGNOSIS — J9620 Acute and chronic respiratory failure, unspecified whether with hypoxia or hypercapnia: Secondary | ICD-10-CM

## 2014-09-01 DIAGNOSIS — J690 Pneumonitis due to inhalation of food and vomit: Secondary | ICD-10-CM | POA: Diagnosis present

## 2014-09-01 DIAGNOSIS — A419 Sepsis, unspecified organism: Principal | ICD-10-CM | POA: Diagnosis present

## 2014-09-01 DIAGNOSIS — R0602 Shortness of breath: Secondary | ICD-10-CM

## 2014-09-01 DIAGNOSIS — Z882 Allergy status to sulfonamides status: Secondary | ICD-10-CM

## 2014-09-01 DIAGNOSIS — R0902 Hypoxemia: Secondary | ICD-10-CM

## 2014-09-01 DIAGNOSIS — J849 Interstitial pulmonary disease, unspecified: Secondary | ICD-10-CM | POA: Diagnosis present

## 2014-09-01 DIAGNOSIS — I959 Hypotension, unspecified: Secondary | ICD-10-CM

## 2014-09-01 DIAGNOSIS — J9621 Acute and chronic respiratory failure with hypoxia: Secondary | ICD-10-CM | POA: Diagnosis present

## 2014-09-01 DIAGNOSIS — I471 Supraventricular tachycardia: Secondary | ICD-10-CM | POA: Diagnosis present

## 2014-09-01 DIAGNOSIS — Z9981 Dependence on supplemental oxygen: Secondary | ICD-10-CM

## 2014-09-01 DIAGNOSIS — J9611 Chronic respiratory failure with hypoxia: Secondary | ICD-10-CM | POA: Diagnosis present

## 2014-09-01 HISTORY — DX: Acute embolism and thrombosis of unspecified deep veins of unspecified lower extremity: I82.409

## 2014-09-01 LAB — I-STAT TROPONIN, ED: TROPONIN I, POC: 0.01 ng/mL (ref 0.00–0.08)

## 2014-09-01 LAB — I-STAT CG4 LACTIC ACID, ED: Lactic Acid, Venous: 1.63 mmol/L (ref 0.5–2.0)

## 2014-09-01 MED ORDER — VANCOMYCIN HCL IN DEXTROSE 1-5 GM/200ML-% IV SOLN
1000.0000 mg | Freq: Once | INTRAVENOUS | Status: AC
  Administered 2014-09-02: 1000 mg via INTRAVENOUS
  Filled 2014-09-01: qty 200

## 2014-09-01 MED ORDER — IPRATROPIUM-ALBUTEROL 0.5-2.5 (3) MG/3ML IN SOLN
3.0000 mL | Freq: Once | RESPIRATORY_TRACT | Status: AC
  Administered 2014-09-02: 3 mL via RESPIRATORY_TRACT
  Filled 2014-09-01: qty 3

## 2014-09-01 MED ORDER — DEXTROSE 5 % IV SOLN
1.0000 g | Freq: Once | INTRAVENOUS | Status: AC
  Administered 2014-09-02: 1 g via INTRAVENOUS
  Filled 2014-09-01: qty 1

## 2014-09-01 MED ORDER — SODIUM CHLORIDE 0.9 % IV BOLUS (SEPSIS)
1000.0000 mL | Freq: Once | INTRAVENOUS | Status: AC
  Administered 2014-09-02: 1000 mL via INTRAVENOUS

## 2014-09-01 MED ORDER — ACETAMINOPHEN 325 MG PO TABS
650.0000 mg | ORAL_TABLET | Freq: Once | ORAL | Status: AC
  Administered 2014-09-02: 650 mg via ORAL
  Filled 2014-09-01: qty 2

## 2014-09-01 NOTE — ED Notes (Signed)
SOB for three months with productive cough with gray phlegm.  SOB worse today.  C/O pain in center of back started today.

## 2014-09-01 NOTE — ED Provider Notes (Signed)
This chart was scribed for Mauldin, DO by Chester Holstein, ED Scribe. This patient was seen in room A02C/A02C.   TIME SEEN: 11:36 PM  CHIEF COMPLAINT: SOB and back pain  HPI: Rachel Walters is a 81 y.o. female with a h/o of arthritis, COPD, HTN who presents to the Emergency Department with SOB with onset yesterday.  States shortness of breath is worse with exertion. Pt notes she got up to use the restroom at onset. Pt notes associated fever at over 100 degrees and upper back pain that is reproducible with palpation but denies chest pain or chest discomfort.  Pt has had a cough for 3 months with grayish sputum. Pt notes she has been losing her voice recently. Pt's last stress test was 2 months ago and she reports this was normal. Pt is on 3 L of O2 at home.  Pt denies history of PE or DVT.  Pt's PCP is Dr. Dagmar Hait.   EMS reports patient says were in the mid to low 80s on her normal 2 L of oxygen when they arrived at her house.   ROS: See HPI Constitutional: fever Eyes: no drainage  ENT: no runny nose   Cardiovascular:  no chest pain  Resp: SOB GI: no vomiting GU: no dysuria Integumentary: no rash  Allergy: no hives  Musculoskeletal: no leg swelling  Neurological: no slurred speech ROS otherwise negative  PAST MEDICAL HISTORY/PAST SURGICAL HISTORY:  Past Medical History  Diagnosis Date  . Arthritis   . COPD (chronic obstructive pulmonary disease)   . Hypertension   . Headache(784.0)   . Neuromuscular disorder   . Asthmatic bronchitis   . Esophageal reflux 05/19/2012  . Dyspnea 04/23/2014  . ILD (interstitial lung disease) 05/24/2014  . H. pylori infection   . Hiatal hernia   . Presbyesophagus   . Esophageal dysmotility     MEDICATIONS:  Prior to Admission medications   Medication Sig Start Date End Date Taking? Authorizing Provider  Aclidinium Bromide (TUDORZA PRESSAIR) 400 MCG/ACT AEPB Inhale 1 puff into the lungs 2 (two) times daily. 05/24/14   Tammy S Parrett, NP   amLODipine (NORVASC) 2.5 MG tablet Take by mouth daily.      Historical Provider, MD  Calcium Carbonate-Vitamin D (CALCIUM 600+D) 600-200 MG-UNIT TABS Take 1 tablet by mouth daily.      Historical Provider, MD  cholecalciferol (VITAMIN D) 1000 UNITS tablet Take 1,000 Units by mouth daily.      Historical Provider, MD  CLORAZEPATE DIPOTASSIUM PO Take 7.5 mg by mouth at bedtime.     Historical Provider, MD  Fluticasone-Salmeterol (ADVAIR) 250-50 MCG/DOSE AEPB Inhale 1 puff into the lungs every 12 (twelve) hours. 06/24/14   Juanito Doom, MD  methylPREDNISolone (MEDROL) 16 MG tablet Take 2 each morning on a full stomach until seen back in office by Dr. Lake Bells 08/15/14   Juanito Doom, MD  metoprolol (LOPRESSOR) 50 MG tablet Take 50 mg by mouth 2 (two) times daily.      Historical Provider, MD  Multiple Vitamins-Minerals (CENTRUM SILVER ULTRA WOMENS) TABS Take 1 tablet by mouth daily.      Historical Provider, MD  olmesartan (BENICAR) 40 MG tablet Take 40 mg by mouth daily.    Historical Provider, MD  pravastatin (PRAVACHOL) 10 MG tablet Take 10 mg by mouth daily.      Historical Provider, MD  PROAIR HFA 108 (90 BASE) MCG/ACT inhaler Inhale 2 puffs into the lungs every 6 (six) hours as  needed. 03/25/14   Historical Provider, MD  traMADol (ULTRAM) 50 MG tablet Take 50 mg by mouth every 6 (six) hours as needed. Pain.Marland KitchenMarland KitchenMarland KitchenMaximum dose= 8 tablets per day     Historical Provider, MD  vitamin E 100 UNIT capsule Take 100 Units by mouth daily.      Historical Provider, MD    ALLERGIES:  Allergies  Allergen Reactions  . Sulfa Antibiotics Other (See Comments)    Skin turned yellow    SOCIAL HISTORY:  History  Substance Use Topics  . Smoking status: Former Smoker -- 0.50 packs/day for 50 years    Types: Cigarettes    Quit date: 07/05/2011  . Smokeless tobacco: Never Used  . Alcohol Use: No    FAMILY HISTORY: Family History  Problem Relation Age of Onset  . Diabetes Mother   . Diabetes  Sister   . Diabetes Maternal Aunt   . Breast cancer Sister     EXAM: BP 102/50 mmHg  Pulse 127  Temp(Src) 101.1 F (38.4 C) (Rectal)  Resp 25  Ht 5\' 5"  (1.651 m)  Wt 135 lb (61.236 kg)  BMI 22.47 kg/m2  SpO2 96% CONSTITUTIONAL: Alert and oriented and responds appropriately to questions. Well-appearing; well-nourished, elderly but non toxic appearing HEAD: Normocephalic EYES: Conjunctivae clear, PERRL ENT: normal nose; no rhinorrhea; moist mucous membranes; pharynx without lesions noted NECK: Supple, no meningismus, no LAD  CARD:  regular rate and tachycardic; S1 and S2 appreciated; no murmurs, no clicks, no rubs, no gallops, RESP: Normal chest excursion without splinting or tachypnea; breath sounds clear and equal bilaterally; no wheezes, no rhonchi, no rales, diminished at her bases bilaterally ABD/GI: Normal bowel sounds; non-distended; soft, non-tender, no rebound, no guarding BACK:  The back appears normal and is non-tender to palpation, there is no CVA tenderness EXT: Normal ROM in all joints; non-tender to palpation; no edema; normal capillary refill; no cyanosis, no calf tenderness or swelling, venous stasis dermatitis in bilateral LE   SKIN: Normal color for age and race; warm NEURO: Moves all extremities equally PSYCH: The patient's mood and manner are appropriate. Grooming and personal hygiene are appropriate.  MEDICAL DECISION MAKING: Pt is here with tachycardia, fever, SOB.  Pt meets SIRS criteria, we'll start broad-spectrum antibiotics. We'll obtain labs, CXR, blood cultures, urine culture, lactate.  ED PROGRESS: Patient's lactate is normal. She does not have a leukocytosis but does have predominant neutrophils. She has mild elevation of her creatinine from her baseline. Troponin negative. Chest x-ray shows no obvious infiltrate but given her cough with sputum production, fever, shortness of breath and worsening hypoxia I feel that she clinically has pneumonia. She has  received broad-spectrum antibiotics. Urinalysis pending.    Nursing staff notified me that patient's blood pressure is now in the 86V to 78I systolic after 1 L of IV fluids. We'll give second IV fluid bolus and check manual pressure.   Blood pressure confirmed with automatic cuff and manual pressure that her blood pressures are down the 69G systolic. Heart rate is 104. She does not feel lightheaded and reports feeling better but states that just getting on the bedpan made her feel very short of breath and she became tachypneic, tachycardic but did not drop her oxygen saturation. We'll discuss with hospitalist for admission.  Discussed with Dr. Arnoldo Morale, hospitalist for admission to step down.  CRITICAL CARE Performed by: Nyra Jabs   Total critical care time: 45 minutes  Critical care time was exclusive of separately billable procedures and treating  other patients.  Critical care was necessary to treat or prevent imminent or life-threatening deterioration.  Critical care was time spent personally by me on the following activities: development of treatment plan with patient and/or surrogate as well as nursing, discussions with consultants, evaluation of patient's response to treatment, examination of patient, obtaining history from patient or surrogate, ordering and performing treatments and interventions, ordering and review of laboratory studies, ordering and review of radiographic studies, pulse oximetry and re-evaluation of patient's condition.       EKG Interpretation  Date/Time:  Monday September 02 2014 00:19:09 EST Ventricular Rate:  110 PR Interval:  142 QRS Duration: 84 QT Interval:  302 QTC Calculation: 408 R Axis:   31 Text Interpretation:  Sinus tachycardia No old tracing to compare Confirmed by WARD,  DO, KRISTEN ([id number]) on 09/02/2014 12:24:03 AM        [place], DO 09/02/14 5830

## 2014-09-02 ENCOUNTER — Encounter (HOSPITAL_COMMUNITY): Payer: Self-pay | Admitting: General Practice

## 2014-09-02 DIAGNOSIS — J189 Pneumonia, unspecified organism: Secondary | ICD-10-CM

## 2014-09-02 DIAGNOSIS — J449 Chronic obstructive pulmonary disease, unspecified: Secondary | ICD-10-CM | POA: Diagnosis present

## 2014-09-02 DIAGNOSIS — I9589 Other hypotension: Secondary | ICD-10-CM

## 2014-09-02 DIAGNOSIS — K219 Gastro-esophageal reflux disease without esophagitis: Secondary | ICD-10-CM | POA: Diagnosis present

## 2014-09-02 DIAGNOSIS — R0600 Dyspnea, unspecified: Secondary | ICD-10-CM | POA: Diagnosis present

## 2014-09-02 DIAGNOSIS — Z7901 Long term (current) use of anticoagulants: Secondary | ICD-10-CM | POA: Diagnosis not present

## 2014-09-02 DIAGNOSIS — I82401 Acute embolism and thrombosis of unspecified deep veins of right lower extremity: Secondary | ICD-10-CM | POA: Diagnosis present

## 2014-09-02 DIAGNOSIS — J439 Emphysema, unspecified: Secondary | ICD-10-CM

## 2014-09-02 DIAGNOSIS — A419 Sepsis, unspecified organism: Secondary | ICD-10-CM | POA: Diagnosis present

## 2014-09-02 DIAGNOSIS — Z9981 Dependence on supplemental oxygen: Secondary | ICD-10-CM | POA: Diagnosis not present

## 2014-09-02 DIAGNOSIS — J690 Pneumonitis due to inhalation of food and vomit: Secondary | ICD-10-CM | POA: Insufficient documentation

## 2014-09-02 DIAGNOSIS — J9621 Acute and chronic respiratory failure with hypoxia: Secondary | ICD-10-CM

## 2014-09-02 DIAGNOSIS — R509 Fever, unspecified: Secondary | ICD-10-CM | POA: Insufficient documentation

## 2014-09-02 DIAGNOSIS — R0602 Shortness of breath: Secondary | ICD-10-CM

## 2014-09-02 DIAGNOSIS — J9620 Acute and chronic respiratory failure, unspecified whether with hypoxia or hypercapnia: Secondary | ICD-10-CM

## 2014-09-02 DIAGNOSIS — I10 Essential (primary) hypertension: Secondary | ICD-10-CM | POA: Diagnosis present

## 2014-09-02 DIAGNOSIS — Z87891 Personal history of nicotine dependence: Secondary | ICD-10-CM | POA: Diagnosis not present

## 2014-09-02 DIAGNOSIS — Z79899 Other long term (current) drug therapy: Secondary | ICD-10-CM | POA: Diagnosis not present

## 2014-09-02 DIAGNOSIS — D7589 Other specified diseases of blood and blood-forming organs: Secondary | ICD-10-CM | POA: Diagnosis present

## 2014-09-02 DIAGNOSIS — J9611 Chronic respiratory failure with hypoxia: Secondary | ICD-10-CM

## 2014-09-02 DIAGNOSIS — I471 Supraventricular tachycardia: Secondary | ICD-10-CM | POA: Diagnosis not present

## 2014-09-02 DIAGNOSIS — K224 Dyskinesia of esophagus: Secondary | ICD-10-CM | POA: Diagnosis present

## 2014-09-02 DIAGNOSIS — I959 Hypotension, unspecified: Secondary | ICD-10-CM

## 2014-09-02 DIAGNOSIS — R6510 Systemic inflammatory response syndrome (SIRS) of non-infectious origin without acute organ dysfunction: Secondary | ICD-10-CM | POA: Diagnosis present

## 2014-09-02 DIAGNOSIS — J849 Interstitial pulmonary disease, unspecified: Secondary | ICD-10-CM | POA: Diagnosis present

## 2014-09-02 DIAGNOSIS — Z882 Allergy status to sulfonamides status: Secondary | ICD-10-CM | POA: Diagnosis not present

## 2014-09-02 DIAGNOSIS — J438 Other emphysema: Secondary | ICD-10-CM

## 2014-09-02 LAB — BASIC METABOLIC PANEL
ANION GAP: 7 (ref 5–15)
BUN: 18 mg/dL (ref 6–23)
CHLORIDE: 98 mmol/L (ref 96–112)
CO2: 31 mmol/L (ref 19–32)
CREATININE: 1.21 mg/dL — AB (ref 0.50–1.10)
Calcium: 9.1 mg/dL (ref 8.4–10.5)
GFR calc Af Amer: 46 mL/min — ABNORMAL LOW (ref >90)
GFR, EST NON AFRICAN AMERICAN: 39 mL/min — AB (ref >90)
Glucose, Bld: 127 mg/dL — ABNORMAL HIGH (ref 70–99)
Potassium: 3.9 mmol/L (ref 3.5–5.1)
Sodium: 136 mmol/L (ref 135–145)

## 2014-09-02 LAB — CBC WITH DIFFERENTIAL/PLATELET
Basophils Absolute: 0.1 10*3/uL (ref 0.0–0.1)
Basophils Relative: 1 % (ref 0–1)
Eosinophils Absolute: 0.2 10*3/uL (ref 0.0–0.7)
Eosinophils Relative: 2 % (ref 0–5)
HCT: 40 % (ref 36.0–46.0)
Hemoglobin: 13.4 g/dL (ref 12.0–15.0)
Lymphocytes Relative: 5 % — ABNORMAL LOW (ref 12–46)
Lymphs Abs: 0.5 10*3/uL — ABNORMAL LOW (ref 0.7–4.0)
MCH: 33.6 pg (ref 26.0–34.0)
MCHC: 33.5 g/dL (ref 30.0–36.0)
MCV: 100.3 fL — ABNORMAL HIGH (ref 78.0–100.0)
Monocytes Absolute: 0.9 10*3/uL (ref 0.1–1.0)
Monocytes Relative: 9 % (ref 3–12)
Neutro Abs: 8.1 10*3/uL — ABNORMAL HIGH (ref 1.7–7.7)
Neutrophils Relative %: 83 % — ABNORMAL HIGH (ref 43–77)
Platelets: 187 10*3/uL (ref 150–400)
RBC: 3.99 MIL/uL (ref 3.87–5.11)
RDW: 13.4 % (ref 11.5–15.5)
WBC: 9.8 10*3/uL (ref 4.0–10.5)

## 2014-09-02 LAB — URINALYSIS, ROUTINE W REFLEX MICROSCOPIC
Bilirubin Urine: NEGATIVE
Glucose, UA: NEGATIVE mg/dL
Hgb urine dipstick: NEGATIVE
KETONES UR: NEGATIVE mg/dL
NITRITE: NEGATIVE
PH: 7 (ref 5.0–8.0)
Protein, ur: NEGATIVE mg/dL
Specific Gravity, Urine: 1.012 (ref 1.005–1.030)
Urobilinogen, UA: 0.2 mg/dL (ref 0.0–1.0)

## 2014-09-02 LAB — INFLUENZA PANEL BY PCR (TYPE A & B)
H1N1 flu by pcr: NOT DETECTED
INFLAPCR: NEGATIVE
Influenza B By PCR: NEGATIVE

## 2014-09-02 LAB — URINE MICROSCOPIC-ADD ON

## 2014-09-02 LAB — I-STAT CG4 LACTIC ACID, ED: Lactic Acid, Venous: 1.17 mmol/L (ref 0.5–2.0)

## 2014-09-02 LAB — BRAIN NATRIURETIC PEPTIDE: B Natriuretic Peptide: 43.4 pg/mL (ref 0.0–100.0)

## 2014-09-02 MED ORDER — VANCOMYCIN HCL IN DEXTROSE 1-5 GM/200ML-% IV SOLN: 1000.0000 mg | INTRAVENOUS | Status: DC

## 2014-09-02 MED ORDER — DEXTROSE 5 % IV SOLN
1.0000 g | INTRAVENOUS | Status: AC
  Administered 2014-09-02 – 2014-09-07 (×6): 1 g via INTRAVENOUS
  Filled 2014-09-02 (×7): qty 1

## 2014-09-02 MED ORDER — ONDANSETRON HCL 4 MG PO TABS
4.0000 mg | ORAL_TABLET | Freq: Four times a day (QID) | ORAL | Status: DC | PRN
  Administered 2014-09-09: 4 mg via ORAL
  Filled 2014-09-02: qty 1

## 2014-09-02 MED ORDER — VITAMIN D 1000 UNITS PO TABS
1000.0000 [IU] | ORAL_TABLET | Freq: Every day | ORAL | Status: DC
  Administered 2014-09-03 – 2014-09-09 (×7): 1000 [IU] via ORAL
  Filled 2014-09-02 (×7): qty 1

## 2014-09-02 MED ORDER — ENOXAPARIN SODIUM 30 MG/0.3ML ~~LOC~~ SOLN
30.0000 mg | Freq: Every day | SUBCUTANEOUS | Status: DC
Start: 2014-09-02 — End: 2014-09-03
  Administered 2014-09-02 – 2014-09-03 (×2): 30 mg via SUBCUTANEOUS
  Filled 2014-09-02 (×3): qty 0.3

## 2014-09-02 MED ORDER — ACYCLOVIR 800 MG PO TABS
800.0000 mg | ORAL_TABLET | Freq: Every day | ORAL | Status: DC
  Administered 2014-09-02 – 2014-09-08 (×7): 800 mg via ORAL
  Filled 2014-09-02 (×11): qty 1

## 2014-09-02 MED ORDER — SODIUM CHLORIDE 0.9 % IV SOLN
INTRAVENOUS | Status: DC
  Administered 2014-09-02: 125 mL/h via INTRAVENOUS
  Administered 2014-09-03: 20:00:00 via INTRAVENOUS

## 2014-09-02 MED ORDER — SODIUM CHLORIDE 0.9 % IV SOLN
INTRAVENOUS | Status: DC
  Administered 2014-09-02 – 2014-09-04 (×3): via INTRAVENOUS

## 2014-09-02 MED ORDER — OXYCODONE HCL 5 MG PO TABS
5.0000 mg | ORAL_TABLET | ORAL | Status: DC | PRN
  Administered 2014-09-04 – 2014-09-05 (×2): 5 mg via ORAL
  Filled 2014-09-02 (×2): qty 1

## 2014-09-02 MED ORDER — PRAVASTATIN SODIUM 20 MG PO TABS
20.0000 mg | ORAL_TABLET | Freq: Every evening | ORAL | Status: DC
  Administered 2014-09-02 – 2014-09-07 (×5): 20 mg via ORAL
  Filled 2014-09-02 (×8): qty 1

## 2014-09-02 MED ORDER — ALUM & MAG HYDROXIDE-SIMETH 200-200-20 MG/5ML PO SUSP: 30.0000 mL | Freq: Four times a day (QID) | ORAL | Status: DC | PRN

## 2014-09-02 MED ORDER — PANTOPRAZOLE SODIUM 40 MG PO TBEC
40.0000 mg | DELAYED_RELEASE_TABLET | Freq: Every day | ORAL | Status: DC
  Administered 2014-09-03 – 2014-09-09 (×7): 40 mg via ORAL
  Filled 2014-09-02 (×6): qty 1

## 2014-09-02 MED ORDER — ZOLPIDEM TARTRATE 5 MG PO TABS
5.0000 mg | ORAL_TABLET | Freq: Once | ORAL | Status: AC
  Administered 2014-09-02: 5 mg via ORAL
  Filled 2014-09-02: qty 1

## 2014-09-02 MED ORDER — ACETAMINOPHEN 650 MG RE SUPP: 650.0000 mg | Freq: Four times a day (QID) | RECTAL | Status: DC | PRN

## 2014-09-02 MED ORDER — SODIUM CHLORIDE 0.9 % IV BOLUS (SEPSIS)
1000.0000 mL | Freq: Once | INTRAVENOUS | Status: AC
  Administered 2014-09-02: 1000 mL via INTRAVENOUS

## 2014-09-02 MED ORDER — VANCOMYCIN HCL IN DEXTROSE 750-5 MG/150ML-% IV SOLN
750.0000 mg | INTRAVENOUS | Status: DC
  Administered 2014-09-03 – 2014-09-04 (×2): 750 mg via INTRAVENOUS
  Filled 2014-09-02 (×3): qty 150

## 2014-09-02 MED ORDER — HYDROMORPHONE HCL 1 MG/ML IJ SOLN: 0.5000 mg | INTRAMUSCULAR | Status: DC | PRN

## 2014-09-02 MED ORDER — ONDANSETRON HCL 4 MG/2ML IJ SOLN: 4.0000 mg | Freq: Four times a day (QID) | INTRAMUSCULAR | Status: DC | PRN

## 2014-09-02 MED ORDER — ACETAMINOPHEN 325 MG PO TABS
650.0000 mg | ORAL_TABLET | Freq: Four times a day (QID) | ORAL | Status: DC | PRN
  Administered 2014-09-02 – 2014-09-09 (×15): 650 mg via ORAL
  Filled 2014-09-02 (×15): qty 2

## 2014-09-02 MED ORDER — IPRATROPIUM-ALBUTEROL 0.5-2.5 (3) MG/3ML IN SOLN
3.0000 mL | RESPIRATORY_TRACT | Status: DC
  Administered 2014-09-02: 3 mL via RESPIRATORY_TRACT
  Filled 2014-09-02 (×2): qty 3

## 2014-09-02 MED ORDER — CALCIUM CARBONATE-VITAMIN D 500-200 MG-UNIT PO TABS
1.0000 | ORAL_TABLET | Freq: Every day | ORAL | Status: DC
  Administered 2014-09-03 – 2014-09-09 (×7): 1 via ORAL
  Filled 2014-09-02 (×10): qty 1

## 2014-09-02 MED ORDER — IPRATROPIUM-ALBUTEROL 0.5-2.5 (3) MG/3ML IN SOLN
3.0000 mL | Freq: Four times a day (QID) | RESPIRATORY_TRACT | Status: DC
  Administered 2014-09-02 – 2014-09-04 (×6): 3 mL via RESPIRATORY_TRACT
  Filled 2014-09-02 (×6): qty 3

## 2014-09-02 NOTE — Progress Notes (Signed)
PROGRESS NOTE  Rachel Walters FVC:[id number] DOB: 1926-04-14 DOA: 09/01/2014 PCP: Tivis Ringer, MD  Brief History  81 year old female with a history of COPD, chronic respiratory failure on 3 L nasal cannula at home, question of interstitial lung disease, esophageal dysmotility, hypertension presents with three-day history of worsening shortness of breath and cough with yellow sputum. The patient developed a fever of 101.77F at home. The patient follows Dr. Lake Bells for pulmonary issues. Recent workup of autoimmune antibodies was negative on 06/24/2014. The patient had a fever 101.52F with hypotension emergency department and hypoxemia. She was started on vancomycin and cefepime for sepsis. Assessment/Plan: Sepsis -Suspect pulmonary source -Suspect patient is chronically aspirating given her recent CT chest findings and esophageal dysmotility -IV fluids -Blood cultures and urine cultures have been obtained -Continue vancomycin and cefepime -influenza PCR Acute on chronic respiratory failure/COPD -Presently stable on 3 L -Swallowing evaluation -Continue bronchodilators -Pulmonary hygiene Esophageal dysmotility -Swallow evaluation as discussed Hypertension -Discontinue lisinopril, amlodipine, metoprolol due to hypotension -Blood pressure has gradually improved with fluid resuscitation Lower extremity pain and edema -Venous duplex  Family Communication:   Pt at beside Disposition Plan:   Home when medically stable       Procedures/Studies: Dg Chest Port 1 View  09/02/2014   CLINICAL DATA:  Shortness of breath tonight.  History of COPD.  EXAM: PORTABLE CHEST - 1 VIEW  COMPARISON:  CT chest 05/17/2014.  Chest 07/08/2011.  FINDINGS: Normal heart size and pulmonary vascularity. Hyperinflation suggesting emphysema. Scattered interstitial changes in the lungs probably due to fibrosis. No focal consolidation. No blunting of costophrenic angles. No pneumothorax. Calcified and  tortuous aorta. Postoperative changes in the cervical spine. Degenerative changes in the shoulders and spine. Old left rib fractures.  IMPRESSION: Emphysematous changes in the lungs with scattered interstitial fibrosis. No focal consolidation.   Electronically Signed   By: Lucienne Capers M.D.   On: 09/02/2014 00:22         Subjective: Patient is breathing somewhat better this morning. Denies any chest pain, nausea, vomiting, diarrhea, abdominal pain, dysuria, hematuria, headache, dizziness. Denies any hemoptysis.  Objective: Filed Vitals:   09/02/14 0700 09/02/14 0715 09/02/14 0730 09/02/14 0832  BP: 99/68 101/58 118/60 111/52  Pulse: 96 88 97 94  Temp:      TempSrc:      Resp: 26 20 23 12   Height:      Weight:      SpO2: 89% 100% 100% 99%    Intake/Output Summary (Last 24 hours) at 09/02/14 0851 Last data filed at 09/02/14 0730  Gross per 24 hour  Intake      0 ml  Output    400 ml  Net   -400 ml   Weight change:  Exam:   General:  Pt is alert, follows commands appropriately, not in acute distress  HEENT: No icterus, No thrush, No meningismus, [place]/AT  Cardiovascular: RRR, S1/S2, no rubs, no gallops  Respiratory bibasilar rhonchi. No wheeze.  Abdomen: Soft/+BS, non tender, non distended, no guarding  Extremities: 1+LE edema, scattered ecchymosis and a bilateral lower extremity pretibial area.  Data Reviewed: Basic Metabolic Panel:  Recent Labs Lab 09/01/14 2359  NA 136  K 3.9  CL 98  CO2 31  GLUCOSE 127*  BUN 18  CREATININE 1.21*  CALCIUM 9.1   Liver Function Tests: No results for input(s): AST, ALT, ALKPHOS, BILITOT, PROT, ALBUMIN in the last 168 hours. No results for input(s): LIPASE,  AMYLASE in the last 168 hours. No results for input(s): AMMONIA in the last 168 hours. CBC:  Recent Labs Lab 09/01/14 2359  WBC 9.8  NEUTROABS 8.1*  HGB 13.4  HCT 40.0  MCV 100.3*  PLT 187   Cardiac Enzymes: No results for input(s): CKTOTAL, CKMB,  CKMBINDEX, TROPONINI in the last 168 hours. BNP: Invalid input(s): POCBNP CBG: No results for input(s): GLUCAP in the last 168 hours.  No results found for this or any previous visit (from the past 240 hour(s)).   Scheduled Meds: . acyclovir  800 mg Oral QHS  . calcium-vitamin D  1 tablet Oral Q breakfast  . cholecalciferol  1,000 Units Oral Daily  . enoxaparin (LOVENOX) injection  30 mg Subcutaneous Daily  . ipratropium-albuterol  3 mL Nebulization Q4H  . pantoprazole  40 mg Oral Daily  . pravastatin  20 mg Oral QPM   Continuous Infusions: . sodium chloride Stopped (09/02/14 0743)  . sodium chloride 100 mL/hr at 09/02/14 0742  . ceFEPime (MAXIPIME) IV    . [START ON 09/04/2014] vancomycin       Rhonna Holster, DO  [facility] Pager [phone]  If 7PM-7AM, please contact night-coverage [email or web address] Password [email or web address] 09/02/2014, 8:51 AM   LOS: 1 day

## 2014-09-02 NOTE — Progress Notes (Addendum)
ANTIBIOTIC CONSULT NOTE - INITIAL  Pharmacy Consult for Vancomycin and Cefepime  Indication: rule out sepsis  Allergies  Allergen Reactions  . Sulfa Antibiotics Other (See Comments)    Skin turned yellow    Patient Measurements: Height: 5\' 5"  (165.1 cm) Weight: 135 lb (61.236 kg) IBW/kg (Calculated) : 57  Vital Signs: Temp: 101.1 F (38.4 C) (02/07 2352) Temp Source: Rectal (02/07 2352) BP: 90/42 mmHg (02/08 0230) Pulse Rate: 103 (02/08 0230) Intake/Output from previous day:   Intake/Output from this shift:    Labs:  Recent Labs  09/01/14 2359  WBC 9.8  HGB 13.4  PLT 187  CREATININE 1.21*   Estimated Creatinine Clearance: 30 mL/min (by C-G formula based on Cr of 1.21). No results for input(s): VANCOTROUGH, VANCOPEAK, VANCORANDOM, GENTTROUGH, GENTPEAK, GENTRANDOM, TOBRATROUGH, TOBRAPEAK, TOBRARND, AMIKACINPEAK, AMIKACINTROU, AMIKACIN in the last 72 hours.   Microbiology: No results found for this or any previous visit (from the past 720 hour(s)).  Medical History: Past Medical History  Diagnosis Date  . Arthritis   . COPD (chronic obstructive pulmonary disease)   . Hypertension   . Headache(784.0)   . Neuromuscular disorder   . Asthmatic bronchitis   . Esophageal reflux 05/19/2012  . Dyspnea 04/23/2014  . ILD (interstitial lung disease) 05/24/2014  . H. pylori infection   . Hiatal hernia   . Presbyesophagus   . Esophageal dysmotility     Medications:  Tudorza  Advair  Acyclovir  NOrvasc  Oscal-D  Vit D  Tranxene  Zestril  Lopressor  MVI  Prilosec  Pravachol  Albuterol  Ultram  Vit E   Assessment: 81 yo female with SOB/fever, possible sepsis, for empiric antibiotics.  Vancomycin 1 g IV given in ED at  0100  Goal of Therapy:  Vancomycin trough level 15-20 mcg/ml  Plan:  Vancomycin 1 g IV q48h Cefepime 1 g IV q24h  Abbott, Bronson Curb 09/02/2014,3:13  AM  ----------------------------------------------------------------------------------------------------------------- Addendum  Given the patient's weight, age, and renal function - will adjust Vancomycin dose slightly.   Plan 1. Adjust Vancomycin to 750 mg IV every 24 hours (next dose due on 2/9) 2. Continue Cefepime 1g IV every 24 hours 3. Will continue to follow renal function, culture results, LOT, and antibiotic de-escalation plans   Alycia Rossetti, PharmD, BCPS Clinical Pharmacist Pager: [phone] 09/02/2014 2:30 PM

## 2014-09-02 NOTE — H&P (Signed)
[facility] Admission History and Physical       Rachel Walters FTD:[id number] DOB: 09/13/1925 DOA: 09/01/2014  Referring physician:  PCP: Tivis Ringer, MD  Specialists:   Chief Complaint: Fevers, Cough and increased SOB  HPI: Rachel Walters is a 81 y.o. female with a history of O2 Dependent COPD and ILD, HTN, and Esophageal Dysmotility who presents to the ED with complaints of worsening SOB and Productive Cough over the past 2-3 days.  She began to have fevers over the past 24 hours, and reports that she has a chronic cough which has been gray in color but has changed to yellow over thepast 2 days.   She was evaluated in the ED and her Chest X-Ray revealed COPD changes but no infiltrates, however her vitals revealed hypotension and tachycardia and hypoxemia.    A sepsis workup was initiated and she was placed on IV Vancomycin and Cefepime and referred for admission.      Review of Systems:  Constitutional: No Weight Loss, No Weight Gain, Night Sweats, Fevers, Chills, Dizziness, Light Headedness, Fatigue, or Generalized Weakness HEENT: No Headaches, Difficulty Swallowing,Tooth/Dental Problems,Sore Throat,  No Sneezing, Rhinitis, Ear Ache, Nasal Congestion, or Post Nasal Drip,  Cardio-vascular:  No Chest pain, Orthopnea, PND, Edema in Lower Extremities, Anasarca, Dizziness, Palpitations  Resp:  +Dyspnea,  +DOE, +Productive Cough, No Hemoptysis, No Wheezing.    GI: No Heartburn, Indigestion, Abdominal Pain, Nausea, Vomiting, Diarrhea, Constipation, Hematemesis, Hematochezia, Melena, Change in Bowel Habits,  Loss of Appetite  GU: No Dysuria, No Change in Color of Urine, No Urgency or Urinary Frequency, No Flank pain.  Musculoskeletal: No Joint Pain or Swelling, No Decreased Range of Motion, No Back Pain.  Neurologic: No Syncope, No Seizures, Muscle Weakness, Paresthesia, Vision Disturbance or Loss, No Diplopia, No Vertigo, No Difficulty Walking,  Skin: No Rash or Lesions. Psych: No  Change in Mood or Affect, No Depression or Anxiety, No Memory loss, No Confusion, or Hallucinations   Past Medical History  Diagnosis Date  . Arthritis   . COPD (chronic obstructive pulmonary disease)   . Hypertension   . Headache(784.0)   . Neuromuscular disorder   . Asthmatic bronchitis   . Esophageal reflux 05/19/2012  . Dyspnea 04/23/2014  . ILD (interstitial lung disease) 05/24/2014  . H. pylori infection   . Hiatal hernia   . Presbyesophagus   . Esophageal dysmotility      Past Surgical History  Procedure Laterality Date  . Neck surgery  1992 and 1993  . Balloon dilation  05/19/2012    Procedure: BALLOON DILATION;  Surgeon: Lafayette Dragon, MD;  Location: [facility];  Service: Endoscopy;  Laterality: N/A;      Prior to Admission medications   Medication Sig Start Date End Date Taking? Authorizing Provider  Aclidinium Bromide (TUDORZA PRESSAIR) 400 MCG/ACT AEPB Inhale 1 puff into the lungs 2 (two) times daily. 05/24/14  Yes Tammy S Parrett, NP  acyclovir (ZOVIRAX) 800 MG tablet Take 800 mg by mouth at bedtime.   Yes Historical Provider, MD  amLODipine (NORVASC) 2.5 MG tablet Take 2.5 mg by mouth daily.    Yes Historical Provider, MD  Calcium Carbonate-Vitamin D (CALCIUM 600+D) 600-200 MG-UNIT TABS Take 1 tablet by mouth daily.     Yes Historical Provider, MD  cholecalciferol (VITAMIN D) 1000 UNITS tablet Take 1,000 Units by mouth daily.     Yes Historical Provider, MD  CLORAZEPATE DIPOTASSIUM PO Take 7.5 mg by mouth at bedtime.    Yes  Historical Provider, MD  Fluticasone-Salmeterol (ADVAIR) 250-50 MCG/DOSE AEPB Inhale 1 puff into the lungs every 12 (twelve) hours. 06/24/14  Yes Juanito Doom, MD  lisinopril (PRINIVIL,ZESTRIL) 40 MG tablet Take 40 mg by mouth every evening.   Yes Historical Provider, MD  metoprolol (LOPRESSOR) 50 MG tablet Take 50 mg by mouth 2 (two) times daily.     Yes Historical Provider, MD  Multiple Vitamins-Minerals (CENTRUM SILVER ULTRA WOMENS)  TABS Take 1 tablet by mouth daily.     Yes Historical Provider, MD  omeprazole (PRILOSEC) 20 MG capsule Take 20 mg by mouth.   Yes Historical Provider, MD  pravastatin (PRAVACHOL) 10 MG tablet Take 20 mg by mouth every evening.    Yes Historical Provider, MD  PROAIR HFA 108 (90 BASE) MCG/ACT inhaler Inhale 2 puffs into the lungs every 6 (six) hours as needed for wheezing or shortness of breath.  03/25/14  Yes Historical Provider, MD  traMADol (ULTRAM) 50 MG tablet Take 100 mg by mouth at bedtime.    Yes Historical Provider, MD  vitamin E 100 UNIT capsule Take 100 Units by mouth daily.     Yes Historical Provider, MD  methylPREDNISolone (MEDROL) 16 MG tablet Take 2 each morning on a full stomach until seen back in office by Dr. Lake Bells Patient not taking: Reported on 09/02/2014 08/15/14   Juanito Doom, MD  olmesartan (BENICAR) 40 MG tablet Take 40 mg by mouth daily.    Historical Provider, MD     Allergies  Allergen Reactions  . Sulfa Antibiotics Other (See Comments)    Skin turned yellow    Social History:  reports that she quit smoking about 3 years ago. Her smoking use included Cigarettes. She has a 25 pack-year smoking history. She has never used smokeless tobacco. She reports that she does not drink alcohol or use illicit drugs.    Family History  Problem Relation Age of Onset  . Diabetes Mother   . Diabetes Sister   . Diabetes Maternal Aunt   . Breast cancer Sister        Physical Exam:  GEN:  Pleasant Elderly Thin Frail 81 y.o. Caucasian female examined and in no acute distress; cooperative with exam Filed Vitals:   09/02/14 0200 09/02/14 0215 09/02/14 0216 09/02/14 0230  BP: 91/48 102/65 82/50 90/42   Pulse: 99 96  103  Temp:      TempSrc:      Resp: 17 18  22   Height:      Weight:      SpO2: 98% 100%  96%   Blood pressure 90/42, pulse 103, temperature 101.1 F (38.4 C), temperature source Rectal, resp. rate 22, height 5\' 5"  (1.651 m), weight 61.236 kg (135 lb),  SpO2 96 %. PSYCH: She is alert and oriented x4; does not appear anxious does not appear depressed; affect is normal HEENT: Normocephalic and Atraumatic, Mucous membranes pink; PERRLA; EOM intact; Fundi:  Benign;  No scleral icterus, Nares: Patent, Oropharynx: Clear, Fair Dentition,    Neck:  FROM, No Cervical Lymphadenopathy nor Thyromegaly or Carotid Bruit; No JVD; Breasts:: Not examined CHEST WALL: No tenderness CHEST: Normal respiration, clear to auscultation bilaterally HEART: Regular rate and rhythm; no murmurs rubs or gallops BACK: No kyphosis or scoliosis; No CVA tenderness ABDOMEN: Positive Bowel Sounds, Scaphoid, Soft Non-Tender, No Rebound or Guarding; No Masses, No Organomegaly. Rectal Exam: Not done EXTREMITIES: No Age-Appropriate Arthropathy of the Hands and knees; +Ecchymosis/ Erythema of the Anterior Aspects of the distal BLEs,  No Clubbing,  Trace Edema; No Ulcerations. Genitalia: not examined PULSES: 2+ and symmetric SKIN: Normal hydration no rash or ulceration CNS:  Alert and Oriented x 4, No Focal Deficits Vascular: pulses palpable throughout    Labs on Admission:  Basic Metabolic Panel:  Recent Labs Lab 09/01/14 2359  NA 136  K 3.9  CL 98  CO2 31  GLUCOSE 127*  BUN 18  CREATININE 1.21*  CALCIUM 9.1   Liver Function Tests: No results for input(s): AST, ALT, ALKPHOS, BILITOT, PROT, ALBUMIN in the last 168 hours. No results for input(s): LIPASE, AMYLASE in the last 168 hours. No results for input(s): AMMONIA in the last 168 hours. CBC:  Recent Labs Lab 09/01/14 2359  WBC 9.8  NEUTROABS 8.1*  HGB 13.4  HCT 40.0  MCV 100.3*  PLT 187   Cardiac Enzymes: No results for input(s): CKTOTAL, CKMB, CKMBINDEX, TROPONINI in the last 168 hours.  BNP (last 3 results)  Recent Labs  09/01/14 2359  BNP 43.4    ProBNP (last 3 results)  Recent Labs  04/23/14 1626  PROBNP 94.0    CBG: No results for input(s): GLUCAP in the last 168  hours.  Radiological Exams on Admission: Dg Chest Port 1 View  09/02/2014   CLINICAL DATA:  Shortness of breath tonight.  History of COPD.  EXAM: PORTABLE CHEST - 1 VIEW  COMPARISON:  CT chest 05/17/2014.  Chest 07/08/2011.  FINDINGS: Normal heart size and pulmonary vascularity. Hyperinflation suggesting emphysema. Scattered interstitial changes in the lungs probably due to fibrosis. No focal consolidation. No blunting of costophrenic angles. No pneumothorax. Calcified and tortuous aorta. Postoperative changes in the cervical spine. Degenerative changes in the shoulders and spine. Old left rib fractures.  IMPRESSION: Emphysematous changes in the lungs with scattered interstitial fibrosis. No focal consolidation.   Electronically Signed   By: Lucienne Capers M.D.   On: 09/02/2014 00:22     EKG: Independently reviewed. Sinus Tachycardia at rate of 110,  Diffuse Artifact,    Assessment/Plan:   81 y.o. female with  Principal Problem:   1.   Sepsis/SIRS (systemic inflammatory response syndrome)   Cultures sent   IV Vancomycin and Cefepime   IVFs   Active Problems:   2.   Hypotension   IVFs   Monitor BPs     3.   COPD (chronic obstructive pulmonary disease)   DUONebs   O2    Continuous Monitoring of O2 Sats     4.   Chronic hypoxemic respiratory failure   On 3 liters NCO2 Continuously at home     5.   Macrocytosis-   Check B12 and Folate Levels      6.   DVT Prophylaxis    Lovenox     Code Status:      FULL CODE Family Communication:  No Family Present   Disposition Plan:      Inpatient Stepdown Unit  Time spent:   [place] [facility] Pager [phone]   If [place] Please Contact the Day Rounding Team MD for [facility]  If 7PM-7AM, Please Contact Night-Floor Coverage  [email or web address] Password [email or web address] 09/02/2014, 3:12 AM     ADDENDUM:   Patient was seen and examined on 09/02/2014

## 2014-09-02 NOTE — Progress Notes (Signed)
MD notified concerning patient's temp- tylenol already given. Temp increased. Awaiting new orders.

## 2014-09-02 NOTE — Progress Notes (Signed)
VASCULAR LAB PRELIMINARY  PRELIMINARY  PRELIMINARY  PRELIMINARY  Bilateral lower extremity venous Dopplers completed.    Preliminary report:  There is acute DVT noted in the right popliteal and posterior tibial veins.    Harvel Meskill, RVT 09/02/2014, 10:28 AM

## 2014-09-02 NOTE — Progress Notes (Signed)
TACHE BOBST [id number] Code Status: Full   Admission Data: 09/02/2014 11:36 AM Attending Provider:  Tat TEL:MRAJ,HHIDUPBDHD R, MD Consults/ Treatment Team:    KEORA ECCLESTON is a 81 y.o. female patient admitted from ED awake, alert - oriented  X 4- no acute distress noted.  VSS - Blood pressure 132/58, pulse 95, temperature 98.6 F (37 C), temperature source Oral, resp. rate 18, height 5\' 5"  (1.651 m), weight 61.236 kg (135 lb), SpO2 98 %.    IV in place, occlusive dsg intact without redness.  Orientation to room, and floor completed with information packet given to patient/family.  Admission INP armband ID verified with patient/family, and in place.   SR up x 2, fall assessment complete, with patient and family able to verbalize understanding of risk associated with falls, and verbalized understanding to call nsg before up out of bed.  Call light within reach, patient able to voice, and demonstrate understanding.  .     Will cont to eval and treat per MD orders.  Henriette Combs, [place] 09/02/2014 11:36 AM

## 2014-09-02 NOTE — Progress Notes (Signed)
Report called to ED  

## 2014-09-02 NOTE — Progress Notes (Signed)
Per Tat MD, although pt has swallow eval ordered, pt able to eat and take po medications

## 2014-09-03 ENCOUNTER — Encounter (HOSPITAL_COMMUNITY): Payer: Self-pay | Admitting: Internal Medicine

## 2014-09-03 DIAGNOSIS — I82401 Acute embolism and thrombosis of unspecified deep veins of right lower extremity: Secondary | ICD-10-CM

## 2014-09-03 DIAGNOSIS — I82409 Acute embolism and thrombosis of unspecified deep veins of unspecified lower extremity: Secondary | ICD-10-CM

## 2014-09-03 HISTORY — DX: Acute embolism and thrombosis of unspecified deep veins of unspecified lower extremity: I82.409

## 2014-09-03 LAB — CBC
HCT: 37 % (ref 36.0–46.0)
Hemoglobin: 12.2 g/dL (ref 12.0–15.0)
MCH: 33.1 pg (ref 26.0–34.0)
MCHC: 33 g/dL (ref 30.0–36.0)
MCV: 100.3 fL — ABNORMAL HIGH (ref 78.0–100.0)
PLATELETS: 155 10*3/uL (ref 150–400)
RBC: 3.69 MIL/uL — ABNORMAL LOW (ref 3.87–5.11)
RDW: 13.6 % (ref 11.5–15.5)
WBC: 6.8 10*3/uL (ref 4.0–10.5)

## 2014-09-03 LAB — BASIC METABOLIC PANEL
Anion gap: 9 (ref 5–15)
BUN: 7 mg/dL (ref 6–23)
CHLORIDE: 105 mmol/L (ref 96–112)
CO2: 27 mmol/L (ref 19–32)
CREATININE: 0.95 mg/dL (ref 0.50–1.10)
Calcium: 8.4 mg/dL (ref 8.4–10.5)
GFR calc Af Amer: 61 mL/min — ABNORMAL LOW (ref >90)
GFR calc non Af Amer: 53 mL/min — ABNORMAL LOW (ref >90)
GLUCOSE: 93 mg/dL (ref 70–99)
POTASSIUM: 3.6 mmol/L (ref 3.5–5.1)
Sodium: 141 mmol/L (ref 135–145)

## 2014-09-03 LAB — URINE CULTURE
Colony Count: NO GROWTH
Culture: NO GROWTH

## 2014-09-03 MED ORDER — APIXABAN 5 MG PO TABS
10.0000 mg | ORAL_TABLET | Freq: Two times a day (BID) | ORAL | Status: DC
  Administered 2014-09-03 – 2014-09-04 (×2): 10 mg via ORAL
  Filled 2014-09-03 (×3): qty 2

## 2014-09-03 MED ORDER — APIXABAN 5 MG PO TABS: 5.0000 mg | ORAL_TABLET | Freq: Two times a day (BID) | ORAL | Status: DC

## 2014-09-03 MED ORDER — DILTIAZEM HCL 30 MG PO TABS
30.0000 mg | ORAL_TABLET | Freq: Four times a day (QID) | ORAL | Status: DC
  Administered 2014-09-03 – 2014-09-04 (×4): 30 mg via ORAL
  Filled 2014-09-03 (×7): qty 1

## 2014-09-03 NOTE — Progress Notes (Signed)
ANTICOAGULATION CONSULT NOTE - Initial Consult  Pharmacy Consult for Apixaban Indication: DVT  Allergies  Allergen Reactions  . Sulfa Antibiotics Other (See Comments)    Skin turned yellow    Patient Measurements: Height: 5\' 5"  (165.1 cm) Weight: 135 lb (61.236 kg) IBW/kg (Calculated) : 57  Vital Signs: Temp: 99.4 F (37.4 C) (02/09 1448) Temp Source: Oral (02/09 1448) BP: 173/82 mmHg (02/09 1448) Pulse Rate: 144 (02/09 1448)  Labs:  Recent Labs  09/01/14 2359 09/03/14 1000  HGB 13.4 12.2  HCT 40.0 37.0  PLT 187 155  CREATININE 1.21* 0.95    Estimated Creatinine Clearance: 38.3 mL/min (by C-G formula based on Cr of 0.95).   Medical History: Past Medical History  Diagnosis Date  . Arthritis   . COPD (chronic obstructive pulmonary disease)   . Hypertension   . Headache(784.0)   . Neuromuscular disorder   . Asthmatic bronchitis   . Esophageal reflux 05/19/2012  . Dyspnea 04/23/2014  . ILD (interstitial lung disease) 05/24/2014  . H. pylori infection   . Hiatal hernia   . Presbyesophagus   . Esophageal dysmotility   . Leg DVT (deep venous thromboembolism), acute 09/03/2014    Medications:  Prescriptions prior to admission  Medication Sig Dispense Refill Last Dose  . Aclidinium Bromide (TUDORZA PRESSAIR) 400 MCG/ACT AEPB Inhale 1 puff into the lungs 2 (two) times daily. 3 each 3 09/01/2014 at Unknown time  . acyclovir (ZOVIRAX) 800 MG tablet Take 800 mg by mouth at bedtime.   08/31/2014 at Unknown time  . amLODipine (NORVASC) 2.5 MG tablet Take 2.5 mg by mouth daily.    08/31/2014 at Unknown time  . Calcium Carbonate-Vitamin D (CALCIUM 600+D) 600-200 MG-UNIT TABS Take 1 tablet by mouth daily.     08/31/2014 at Unknown time  . cholecalciferol (VITAMIN D) 1000 UNITS tablet Take 1,000 Units by mouth daily.     08/31/2014 at Unknown time  . CLORAZEPATE DIPOTASSIUM PO Take 7.5 mg by mouth at bedtime.    08/31/2014 at Unknown time  . Fluticasone-Salmeterol (ADVAIR) 250-50  MCG/DOSE AEPB Inhale 1 puff into the lungs every 12 (twelve) hours. 3 each 3 08/31/2014  . lisinopril (PRINIVIL,ZESTRIL) 40 MG tablet Take 40 mg by mouth every evening.   08/31/2014 at Unknown time  . metoprolol (LOPRESSOR) 50 MG tablet Take 50 mg by mouth 2 (two) times daily.     08/31/2014 at pt does not remember  . Multiple Vitamins-Minerals (CENTRUM SILVER ULTRA WOMENS) TABS Take 1 tablet by mouth daily.     08/31/2014 at Unknown time  . omeprazole (PRILOSEC) 20 MG capsule Take 20 mg by mouth.   08/31/2014 at Unknown time  . pravastatin (PRAVACHOL) 10 MG tablet Take 20 mg by mouth every evening.    08/31/2014 at Unknown time  . PROAIR HFA 108 (90 BASE) MCG/ACT inhaler Inhale 2 puffs into the lungs every 6 (six) hours as needed for wheezing or shortness of breath.    unknown  . traMADol (ULTRAM) 50 MG tablet Take 100 mg by mouth at bedtime.    08/31/2014 at Unknown time  . vitamin E 100 UNIT capsule Take 100 Units by mouth daily.     08/31/2014 at Unknown time  . methylPREDNISolone (MEDROL) 16 MG tablet Take 2 each morning on a full stomach until seen back in office by Dr. Lake Bells (Patient not taking: Reported on 09/02/2014) 30 tablet 0   . olmesartan (BENICAR) 40 MG tablet Take 40 mg by mouth daily.  Taking   Scheduled:  . acyclovir  800 mg Oral QHS  . calcium-vitamin D  1 tablet Oral Q breakfast  . ceFEPime (MAXIPIME) IV  1 g Intravenous Q24H  . cholecalciferol  1,000 Units Oral Daily  . diltiazem  30 mg Oral 4 times per day  . enoxaparin (LOVENOX) injection  30 mg Subcutaneous Daily  . ipratropium-albuterol  3 mL Nebulization Q6H  . pantoprazole  40 mg Oral Daily  . pravastatin  20 mg Oral QPM  . vancomycin  750 mg Intravenous Q24H   Infusions:  . sodium chloride Stopped (09/02/14 0743)  . sodium chloride 100 mL/hr at 09/02/14 2208    Assessment: 81yo female with history of COPD and chronic respiratory failure here for sepsis. DVT found in R popliteal and posterior tibial veins on dopplers.  Pharmacy is consulted to dose apixaban for DVT. Hgb 12.2, Plt 155, sCr 0.95.  Goal of Therapy:  Monitor platelets by anticoagulation protocol: Yes   Plan:  Apixaban 10mg  PO BID for 7 days followed by 5mg  BID Stop ppx dose of Lovenox Continue to monitor H&H and platelets  Monitor s/sx of bleeding Educate pt on apixaban  Andrey Cota. Diona Foley, PharmD Clinical Pharmacist Pager [phone] 09/03/2014,4:03 PM

## 2014-09-03 NOTE — Clinical Documentation Improvement (Signed)
MD's, NP's, and PA's   The patient has an order for the following medication(s): Acyclovir 800 mg every  Hs.  Please provide the corresponding diagnosis that supports the use of this medication.  Thank you    Risk Factors: SIRS/Sepsis, Acute on Chronic Respiratory Failure,  Interstitial Lung disease  Thank you, Ree Kida ,RN Clinical Documentation Specialist:  [place] Information Management

## 2014-09-03 NOTE — Progress Notes (Addendum)
PROGRESS NOTE  Rachel Walters TFT:[id number] DOB: 1925-09-17 DOA: 09/01/2014 PCP: Tivis Ringer, MD Brief History  81 year old female with a history of COPD, chronic respiratory failure on 3 L nasal cannula at home, question of interstitial lung disease, esophageal dysmotility, hypertension presents with three-day history of worsening shortness of breath and cough with yellow sputum. The patient developed a fever of 101.90F at home. The patient follows Dr. Lake Bells for pulmonary issues. Recent workup of autoimmune antibodies was negative on 06/24/2014. The patient had a fever 101.2F with hypotension emergency department and hypoxemia. She was started on vancomycin and cefepime for sepsis.  Venous duplex of the right lower extremity showed acute DVT. The patient was started on Eliquis. CT angio chest was performed due to continued hypoxemia and progressive tachycardia. Assessment/Plan: Sepsis -Suspect pulmonary source -Suspect patient is chronically aspirating given her recent CT chest findings and esophageal dysmotility -IV fluids -Blood cultures--neg -UA--No significant pyuria -Continue vancomycin and cefepime -influenza PCR--neg Acute on chronic respiratory failure/COPD -Presently stable on 3 L--normally on 3L at home -Swallowing evaluation-->regular diet -Continue bronchodilators -Pulmonary hygiene Acute DVT--R-Leg -unprovoked -Discussed the risks, benefits, and alternatives of warfarin versus NOAC with the patient's son present at the bedside. The patient and son have decided to start Eliquis Tachycardia -partly due to withholding metoprolol -EKG looks like MAT -start diltiazem 30mg  q6 -CTA chest in setting of DVT and hypoxemia Esophageal dysmotility -Swallow evaluation as discussed Hypertension -Discontinue lisinopril, amlodipine, metoprolol due to hypotension-->now BP is better -Blood pressure has improved with fluid resuscitation -Start diltiazem as discussed  above--likely go home on long-acting formulatioin if she clinically improves   Family Communication: son updated at beside 09/03/14 Disposition Plan: Home when medically stable         Procedures/Studies: Dg Chest Port 1 View  09/02/2014   CLINICAL DATA:  Shortness of breath tonight.  History of COPD.  EXAM: PORTABLE CHEST - 1 VIEW  COMPARISON:  CT chest 05/17/2014.  Chest 07/08/2011.  FINDINGS: Normal heart size and pulmonary vascularity. Hyperinflation suggesting emphysema. Scattered interstitial changes in the lungs probably due to fibrosis. No focal consolidation. No blunting of costophrenic angles. No pneumothorax. Calcified and tortuous aorta. Postoperative changes in the cervical spine. Degenerative changes in the shoulders and spine. Old left rib fractures.  IMPRESSION: Emphysematous changes in the lungs with scattered interstitial fibrosis. No focal consolidation.   Electronically Signed   By: Lucienne Capers M.D.   On: 09/02/2014 00:22        Subjective:  patient is still having dyspnea on exertion and tachycardia with exertion. She denies any fevers, chills, hemoptysis, nausea, vomiting, diarrhea, abdominal pain. She states that she is breathing somewhat better.   Objective: Filed Vitals:   09/03/14 0835 09/03/14 1008 09/03/14 1448 09/03/14 1453  BP:  103/83 173/82   Pulse:  120 144   Temp:  98.4 F (36.9 C) 99.4 F (37.4 C)   TempSrc:  Oral Oral   Resp:  20 18   Height:      Weight:      SpO2: 97% 96% 98% 100%    Intake/Output Summary (Last 24 hours) at 09/03/14 1548 Last data filed at 09/03/14 1400  Gross per 24 hour  Intake 2973.33 ml  Output   3502 ml  Net -528.67 ml   Weight change: 0 kg (0 lb) Exam:   General:  Pt is alert, follows commands appropriately, not in acute distress  HEENT: No icterus, No  thrush,  [place]/AT  Cardiovascular: RRR, S1/S2, no rubs, no gallops  Respiratory: bibasilar crackles. No wheezing. Good air movement.   Abdomen:  Soft/+BS, non tender, non distended, no guarding  Extremities: 1+LE R>L edema, No lymphangitis, No petechiae, No rashes, no synovitis  Data Reviewed: Basic Metabolic Panel:  Recent Labs Lab 09/01/14 2359 09/03/14 1000  NA 136 141  K 3.9 3.6  CL 98 105  CO2 31 27  GLUCOSE 127* 93  BUN 18 7  CREATININE 1.21* 0.95  CALCIUM 9.1 8.4   Liver Function Tests: No results for input(s): AST, ALT, ALKPHOS, BILITOT, PROT, ALBUMIN in the last 168 hours. No results for input(s): LIPASE, AMYLASE in the last 168 hours. No results for input(s): AMMONIA in the last 168 hours. CBC:  Recent Labs Lab 09/01/14 2359 09/03/14 1000  WBC 9.8 6.8  NEUTROABS 8.1*  --   HGB 13.4 12.2  HCT 40.0 37.0  MCV 100.3* 100.3*  PLT 187 155   Cardiac Enzymes: No results for input(s): CKTOTAL, CKMB, CKMBINDEX, TROPONINI in the last 168 hours. BNP: Invalid input(s): POCBNP CBG: No results for input(s): GLUCAP in the last 168 hours.  Recent Results (from the past 240 hour(s))  Blood culture (routine x 2)     Status: None (Preliminary result)   Collection Time: 09/02/14 12:09 AM  Result Value Ref Range Status   Specimen Description BLOOD RIGHT WRIST  Final   Special Requests BOTTLES DRAWN AEROBIC AND ANAEROBIC 3CC EA  Final   Culture   Final           BLOOD CULTURE RECEIVED NO GROWTH TO DATE CULTURE WILL BE HELD FOR 5 DAYS BEFORE ISSUING A FINAL NEGATIVE REPORT Performed at [facility]    Report Status PENDING  Incomplete  Blood culture (routine x 2)     Status: None (Preliminary result)   Collection Time: 09/02/14 12:16 AM  Result Value Ref Range Status   Specimen Description BLOOD LEFT WRIST  Final   Special Requests BOTTLES DRAWN AEROBIC AND ANAEROBIC 3CC EA  Final   Culture   Final           BLOOD CULTURE RECEIVED NO GROWTH TO DATE CULTURE WILL BE HELD FOR 5 DAYS BEFORE ISSUING A FINAL NEGATIVE REPORT Performed at [facility]    Report Status PENDING  Incomplete  Urine  culture     Status: None   Collection Time: 09/02/14  1:29 AM  Result Value Ref Range Status   Specimen Description URINE, CLEAN CATCH  Final   Special Requests NONE  Final   Colony Count NO GROWTH Performed at [facility]   Final   Culture NO GROWTH Performed at [facility]   Final   Report Status 09/03/2014 FINAL  Final     Scheduled Meds: . acyclovir  800 mg Oral QHS  . calcium-vitamin D  1 tablet Oral Q breakfast  . ceFEPime (MAXIPIME) IV  1 g Intravenous Q24H  . cholecalciferol  1,000 Units Oral Daily  . diltiazem  30 mg Oral 4 times per day  . enoxaparin (LOVENOX) injection  30 mg Subcutaneous Daily  . ipratropium-albuterol  3 mL Nebulization Q6H  . pantoprazole  40 mg Oral Daily  . pravastatin  20 mg Oral QPM  . vancomycin  750 mg Intravenous Q24H   Continuous Infusions: . sodium chloride Stopped (09/02/14 0743)  . sodium chloride 100 mL/hr at 09/02/14 2208     Abeni Finchum, DO  [facility]  Pager [phone]  If 7PM-7AM, please contact night-coverage [email or web address] Password [email or web address] 09/03/2014, 3:48 PM   LOS: 2 days

## 2014-09-03 NOTE — Progress Notes (Signed)
MD Alene Mires notified concerning patient's pulse throughout the night. Patient's HR has sustained about 120- reaching 145 with movement. Will continue to monitor

## 2014-09-03 NOTE — Progress Notes (Addendum)
Shift Event: Paged secondary to pt with HR fluctuating up and down, highest 120, other VSS. EKG with ST. HR up and down, will continue to monitor Rachel Walters [facility]

## 2014-09-03 NOTE — Evaluation (Signed)
Clinical/Bedside Swallow Evaluation Patient Details  Name: Rachel Walters MRN: [id number] Date of Birth: [birth year]  Today's Date: 09/03/2014 Time: SLP Start Time (ACUTE ONLY): 38 SLP Stop Time (ACUTE ONLY): 0948 SLP Time Calculation (min) (ACUTE ONLY): 28 min  Past Medical History:  Past Medical History  Diagnosis Date  . Arthritis   . COPD (chronic obstructive pulmonary disease)   . Hypertension   . Headache(784.0)   . Neuromuscular disorder   . Asthmatic bronchitis   . Esophageal reflux 05/19/2012  . Dyspnea 04/23/2014  . ILD (interstitial lung disease) 05/24/2014  . H. pylori infection   . Hiatal hernia   . Presbyesophagus   . Esophageal dysmotility    Past Surgical History:  Past Surgical History  Procedure Laterality Date  . Neck surgery  1992 and 1993  . Balloon dilation  05/19/2012    Procedure: BALLOON DILATION;  Surgeon: Lafayette Dragon, MD;  Location: [facility];  Service: Endoscopy;  Laterality: N/A;   HPI:  Pt is an 81 y.o. female with a history of O2 Dependent COPD and ILD, HTN, and Esophageal Dysmotility who presents to the ED with complaints of worsening SOB and productive cough over the past 2-3 days. She began to have fevers over the past 24 hours, and reports that she has a chronic cough which has been gray in color but has changed to yellow over thepast 2 days. She was evaluated in the ED and her Chest X-Ray revealed COPD changes but no infiltrates, however her vitals revealed hypotension and tachycardia and hypoxemia.   Assessment / Plan / Recommendation Clinical Impression   Goal of session was to determine pt safety with diet. PO trials with thin liquids, and solid consistencies did not elicit s/s of aspiration. Pt's current esophagram showed normal pharyngeal function; no aspiration. Pt may be at risk for aspiration after meals due to Hx of esophageal dysmotility. Discussed esophageal precautions with pt due to Hx of esophageal dysmotility. Recommend  regular/ thin liquid diet. Pt needs to be upright in bed during meals, stay upright  for at least 30 minutes following meal and alternate small bites with sips of liquids. Provided pt with esophageal precautions handout. Speech will sign off.     Aspiration Risk  Mild    Diet Recommendation Regular;Thin liquid   Liquid Administration via: Cup;Straw Medication Administration: Whole meds with liquid Supervision: Patient able to self feed Compensations: Slow rate;Small sips/bites;Follow solids with liquid Postural Changes and/or Swallow Maneuvers: Seated upright 90 degrees;Upright 30-60 min after meal    Other  Recommendations Oral Care Recommendations: Oral care BID   Follow Up Recommendations       Frequency and Duration        Pertinent Vitals/Pain     SLP Swallow Goals     Swallow Study Prior Functional Status       General HPI: Pt is an 81 y.o. female with a history of O2 Dependent COPD and ILD, HTN, and Esophageal Dysmotility who presents to the ED with complaints of worsening SOB and productive cough over the past 2-3 days. She began to have fevers over the past 24 hours, and reports that she has a chronic cough which has been gray in color but has changed to yellow over thepast 2 days. She was evaluated in the ED and her Chest X-Ray revealed COPD changes but no infiltrates, however her vitals revealed hypotension and tachycardia and hypoxemia. Type of Study: Bedside swallow evaluation Diet Prior to this Study: Regular;Thin liquids Temperature  Spikes Noted: No Respiratory Status: Nasal cannula Behavior/Cognition: Alert;Cooperative;Pleasant mood Oral Cavity - Dentition: Adequate natural dentition Self-Feeding Abilities: Able to feed self Patient Positioning: Upright in bed Baseline Vocal Quality: Clear    Oral/Motor/Sensory Function Overall Oral Motor/Sensory Function: Appears within functional limits for tasks assessed Labial ROM: Within Functional Limits Labial  Symmetry: Within Functional Limits Lingual ROM: Within Functional Limits Facial Symmetry: Within Functional Limits   Ice Chips Ice chips: Not tested   Thin Liquid Thin Liquid: Within functional limits Presentation: Cup;Straw    Nectar Thick Nectar Thick Liquid: Not tested   Honey Thick Honey Thick Liquid: Not tested   Puree Puree: Not tested   Solid   GO    Solid: Within functional limits       Bermudez-Bosch, Kryslyn Helbig 09/03/2014,10:54 AM

## 2014-09-03 NOTE — Progress Notes (Signed)
Continue to wait on appropriate iv access for angio chest study 2024

## 2014-09-03 NOTE — Evaluation (Signed)
Physical Therapy Evaluation Patient Details Name: Rachel Walters MRN: [id number] DOB: [birth year] Today's Date: 09/03/2014   History of Present Illness  CHAUNTELLE AZPEITIA is a 81 y.o. female with a history of O2 Dependent COPD and ILD, HTN, and Esophageal Dysmotility who presents to the ED with complaints of worsening SOB and Productive Cough over the past 2-3 days.  She began to have fevers over the past 24 hours, and reports that she has a chronic cough which has been gray in color but has changed to yellow over thepast 2 days.   She was evaluated in the ED and her Chest X-Ray revealed COPD changes but no infiltrates, however her vitals revealed hypotension and tachycardia and hypoxemia.    A sepsis workup was initiated and she was placed on IV Vancomycin and Cefepime and referred for admission.      Clinical Impression  Pt admitted with/for fever, cough and SOB.  Pt currently limited functionally due to the problems listed below.  (see problems list.)  Pt will benefit from PT to maximize function and safety to be able to get home safely with available assist of family.     Follow Up Recommendations Home health PT;Other (comment) (Unless pt does not bounce back enough to be alone)    Equipment Recommendations  Other (comment) (TBA)    Recommendations for Other Services       Precautions / Restrictions Precautions Precautions: Fall Precaution Comments: monitor HR      Mobility  Bed Mobility Overal bed mobility: Needs Assistance Bed Mobility: Supine to Sit;Sit to Supine     Supine to sit: Supervision Sit to supine: Supervision      Transfers Overall transfer level: Needs assistance Equipment used: None Transfers: Sit to/from Stand Sit to Stand: Min guard         General transfer comment: safe transfer technique  Ambulation/Gait Ambulation/Gait assistance: Min guard Ambulation Distance (Feet): 20 Feet Assistive device: None Gait Pattern/deviations: Step-through  pattern;Decreased step length - right;Decreased step length - left;Shuffle     General Gait Details: slow and guarded gait, Further ambulation limited due to tachycardia at upper 120's to mid 130's bpm  sats on 3L [place] at 98%  Stairs            Wheelchair Mobility    Modified Rankin (Stroke Patients Only)       Balance Overall balance assessment: Needs assistance Sitting-balance support: No upper extremity supported Sitting balance-Leahy Scale: Good     Standing balance support: No upper extremity supported Standing balance-Leahy Scale: Fair                               Pertinent Vitals/Pain Pain Assessment: No/denies pain    Home Living Family/patient expects to be discharged to:: Private residence Living Arrangements: Alone Available Help at Discharge: Family;Available PRN/intermittently Type of Home: House Home Access: Stairs to enter Entrance Stairs-Rails: Psychiatric nurse of Steps: 2 Home Layout: One level Home Equipment: Walker - 2 wheels;Cane - single point;Bedside commode (tub seat.)      Prior Function Level of Independence: Independent               Hand Dominance        Extremity/Trunk Assessment   Upper Extremity Assessment: Generalized weakness (L weaker than R )           Lower Extremity Assessment: Overall WFL for tasks assessed  Communication   Communication: No difficulties  Cognition Arousal/Alertness: Awake/alert Behavior During Therapy: WFL for tasks assessed/performed Overall Cognitive Status: Within Functional Limits for tasks assessed                      General Comments      Exercises        Assessment/Plan    PT Assessment Patient needs continued PT services  PT Diagnosis Difficulty walking   PT Problem List Decreased activity tolerance;Decreased balance;Decreased mobility;Cardiopulmonary status limiting activity  PT Treatment Interventions DME  instruction;Gait training;Stair training;Functional mobility training;Therapeutic activities;Patient/family education   PT Goals (Current goals can be found in the Care Plan section) Acute Rehab PT Goals Patient Stated Goal: get back home independent PT Goal Formulation: With patient Time For Goal Achievement: 09/10/14 Potential to Achieve Goals: Good    Frequency Min 3X/week   Barriers to discharge        Co-evaluation               End of Session Equipment Utilized During Treatment: Oxygen Activity Tolerance: Patient tolerated treatment well;Other (comment) (limited by tachycardia) Patient left: in bed;with call bell/phone within reach;with family/visitor present Nurse Communication: Mobility status         Time: 0962-8366 PT Time Calculation (min) (ACUTE ONLY): 29 min   Charges:   PT Evaluation $Initial PT Evaluation Tier I: 1 Procedure PT Treatments $Gait Training: 8-22 mins   PT G Codes:        Leverne Amrhein, Tessie Fass 09/03/2014, 5:02 PM 09/03/2014  Donnella Sham, PT [phone]  (pager)

## 2014-09-04 ENCOUNTER — Encounter (HOSPITAL_COMMUNITY): Payer: Self-pay

## 2014-09-04 ENCOUNTER — Inpatient Hospital Stay (HOSPITAL_COMMUNITY): Payer: Medicare Other

## 2014-09-04 DIAGNOSIS — I82409 Acute embolism and thrombosis of unspecified deep veins of unspecified lower extremity: Secondary | ICD-10-CM

## 2014-09-04 LAB — MRSA PCR SCREENING: MRSA by PCR: NEGATIVE

## 2014-09-04 LAB — BASIC METABOLIC PANEL
Anion gap: 8 (ref 5–15)
BUN: 6 mg/dL (ref 6–23)
CALCIUM: 8.2 mg/dL — AB (ref 8.4–10.5)
CHLORIDE: 106 mmol/L (ref 96–112)
CO2: 26 mmol/L (ref 19–32)
CREATININE: 0.82 mg/dL (ref 0.50–1.10)
GFR calc Af Amer: 73 mL/min — ABNORMAL LOW (ref >90)
GFR, EST NON AFRICAN AMERICAN: 63 mL/min — AB (ref >90)
Glucose, Bld: 86 mg/dL (ref 70–99)
Potassium: 3.9 mmol/L (ref 3.5–5.1)
Sodium: 140 mmol/L (ref 135–145)

## 2014-09-04 LAB — CBC
HEMATOCRIT: 35.1 % — AB (ref 36.0–46.0)
HEMOGLOBIN: 11.8 g/dL — AB (ref 12.0–15.0)
MCH: 32.9 pg (ref 26.0–34.0)
MCHC: 33.6 g/dL (ref 30.0–36.0)
MCV: 97.8 fL (ref 78.0–100.0)
Platelets: 191 10*3/uL (ref 150–400)
RBC: 3.59 MIL/uL — ABNORMAL LOW (ref 3.87–5.11)
RDW: 13.6 % (ref 11.5–15.5)
WBC: 6.5 10*3/uL (ref 4.0–10.5)

## 2014-09-04 MED ORDER — IPRATROPIUM BROMIDE 0.02 % IN SOLN
0.2500 mg | Freq: Three times a day (TID) | RESPIRATORY_TRACT | Status: DC
  Administered 2014-09-04 – 2014-09-05 (×3): 0.25 mg via RESPIRATORY_TRACT
  Administered 2014-09-05: 12:00:00 via RESPIRATORY_TRACT
  Filled 2014-09-04 (×7): qty 2.5

## 2014-09-04 MED ORDER — ZOLPIDEM TARTRATE 5 MG PO TABS
5.0000 mg | ORAL_TABLET | Freq: Once | ORAL | Status: AC
  Administered 2014-09-04: 5 mg via ORAL
  Filled 2014-09-04: qty 1

## 2014-09-04 MED ORDER — IOHEXOL 300 MG/ML  SOLN: 100.0000 mL | Freq: Once | INTRAMUSCULAR | Status: DC | PRN

## 2014-09-04 MED ORDER — DILTIAZEM HCL 100 MG IV SOLR
5.0000 mg/h | INTRAVENOUS | Status: DC
  Administered 2014-09-04: 5 mg/h via INTRAVENOUS
  Administered 2014-09-05: 15 mg/h via INTRAVENOUS
  Filled 2014-09-04: qty 100

## 2014-09-04 MED ORDER — APIXABAN 5 MG PO TABS
10.0000 mg | ORAL_TABLET | Freq: Two times a day (BID) | ORAL | Status: DC
  Administered 2014-09-04 – 2014-09-09 (×10): 10 mg via ORAL
  Filled 2014-09-04 (×12): qty 2

## 2014-09-04 MED ORDER — IOHEXOL 350 MG/ML SOLN
100.0000 mL | Freq: Once | INTRAVENOUS | Status: AC | PRN
  Administered 2014-09-04: 80 mL via INTRAVENOUS

## 2014-09-04 MED ORDER — LEVALBUTEROL HCL 0.63 MG/3ML IN NEBU
0.6300 mg | INHALATION_SOLUTION | Freq: Three times a day (TID) | RESPIRATORY_TRACT | Status: DC
  Administered 2014-09-04 – 2014-09-05 (×4): 0.63 mg via RESPIRATORY_TRACT
  Filled 2014-09-04 (×5): qty 3

## 2014-09-04 MED ORDER — SODIUM CHLORIDE 0.9 % IV SOLN
INTRAVENOUS | Status: DC
  Administered 2014-09-06: 06:00:00 via INTRAVENOUS

## 2014-09-04 MED ORDER — IPRATROPIUM-ALBUTEROL 0.5-2.5 (3) MG/3ML IN SOLN
RESPIRATORY_TRACT | Status: AC
  Filled 2014-09-04: qty 3

## 2014-09-04 MED ORDER — APIXABAN 5 MG PO TABS: 5.0000 mg | ORAL_TABLET | Freq: Two times a day (BID) | ORAL | Status: DC

## 2014-09-04 NOTE — Progress Notes (Signed)
PATIENT DETAILS Name: Rachel Walters Age: 81 y.o. Sex: female Date of Birth: [birth year] Admit Date: 09/01/2014 Admitting Physician Orson Eva, MD [id number] R, MD  Subjective: No major issues  Assessment/Plan: Principal Problem:   Sepsis: Current suspicion is from aspiration pneumonitis. Continue empiric antibiotics. Blood cultures from 2/8 negative so far.  Active Problems:  ? Aspiration pneumonitis: Although no evidence of consolidation in x-ray chest and CT chest, given history of esophageal dysmotility and no other foci of infection apparent to explain the fever and sepsis pathophysiology on admission, currently presumed that she may have aspiration pneumonitis. Blood cultures negative, stop vancomycin, continue with cefepime. If continues to improve we can transition to Augmentin on discharge.    Runs of ?KGM:WNUUVOZD Cardizem for now, change from albuterol to Xopenex. Cardiology consulted.     Leg DVT (deep venous thromboembolism), acute: Started on  Eliquis.CTangiogram of chest negative.      History of COPD on chronic home O2: Lungs seem to be clearing this morning, change bronchodilators to Xopenex and Atrovent. Continue on oxygen.     History of chronic hypoxemic respiratory failure: Secondary to COPD, on home O2.    Essential hypertension: Currently controlled with Cardizem. The syncopal, amlodipine and metoprolol were discontinued on admission because of hypotension.     History of interstitial lung disease: Continue outpatient follow-up with pulmonary.autoimmune workup negative     Spiculated 1.0 cm nodule within the right lower lobe: Outpatient follow-up with pulmonology  Disposition: Remain inpatient-home in 1-2 days  Antibiotics:  See below  Anti-infectives    Start     Dose/Rate Route Frequency Ordered Stop   09/04/14 0600  vancomycin (VANCOCIN) IVPB 1000 mg/200 mL premix  Status:  Discontinued     1,000 mg 200 mL/hr over 60 Minutes  Intravenous Every 48 hours 09/02/14 0319 09/02/14 1431   09/03/14 0400  vancomycin (VANCOCIN) IVPB 750 mg/150 ml premix     750 mg 150 mL/hr over 60 Minutes Intravenous Every 24 hours 09/02/14 1431     09/02/14 2200  acyclovir (ZOVIRAX) tablet 800 mg     800 mg Oral Daily at bedtime 09/02/14 0459     09/02/14 2200  ceFEPIme (MAXIPIME) 1 g in dextrose 5 % 50 mL IVPB     1 g 100 mL/hr over 30 Minutes Intravenous Every 24 hours 09/02/14 0319     09/02/14 0000  vancomycin (VANCOCIN) IVPB 1000 mg/200 mL premix     1,000 mg 200 mL/hr over 60 Minutes Intravenous  Once 09/01/14 2357 09/02/14 0158   09/02/14 0000  ceFEPIme (MAXIPIME) 1 g in dextrose 5 % 50 mL IVPB     1 g 100 mL/hr over 30 Minutes Intravenous  Once 09/01/14 2357 09/02/14 0059      DVT Prophylaxis: Eliquis  Code Status: Full code  Family Communication None at bedside  Procedures:  None  CONSULTS:  cardiology  Time spent 40 minutes-which includes 50% of the time with face-to-face with patient/ family and coordinating care related to the above assessment and plan.  MEDICATIONS: Scheduled Meds: . acyclovir  800 mg Oral QHS  . [START ON 09/10/2014] apixaban  5 mg Oral BID   Followed by  . apixaban  10 mg Oral BID  . calcium-vitamin D  1 tablet Oral Q breakfast  . ceFEPime (MAXIPIME) IV  1 g Intravenous Q24H  . cholecalciferol  1,000 Units Oral Daily  . diltiazem  30 mg Oral 4 times per day  .  ipratropium-albuterol  3 mL Nebulization Q6H  . pantoprazole  40 mg Oral Daily  . pravastatin  20 mg Oral QPM  . vancomycin  750 mg Intravenous Q24H   Continuous Infusions: . sodium chloride 125 mL/hr at 09/03/14 2025  . sodium chloride 100 mL/hr at 09/04/14 0331   PRN Meds:.acetaminophen **OR** acetaminophen, alum & mag hydroxide-simeth, HYDROmorphone (DILAUDID) injection, ondansetron **OR** ondansetron (ZOFRAN) IV, oxyCODONE    PHYSICAL EXAM: Vital signs in last 24 hours: Filed Vitals:   09/04/14 0606 09/04/14  0905 09/04/14 1007 09/04/14 1234  BP: 136/70 135/75 127/65 149/72  Pulse: 112   110  Temp:    98.2 F (36.8 C)  TempSrc:    Oral  Resp:    18  Height:      Weight:      SpO2:    98%    Weight change:  Filed Weights   09/01/14 2338 09/02/14 1119  Weight: 61.236 kg (135 lb) 61.236 kg (135 lb)   Body mass index is 22.47 kg/(m^2).   Gen Exam: Awake and alert with clear speech.   Neck: Supple, No JVD.   Chest: B/L Clear.   CVS: S1 S2 Regular, no murmurs.  Abdomen: soft, BS +, non tender, non distended.  Extremities: no edema, lower extremities warm to touch. Neurologic: Non Focal.  Skin: No Rash. Wounds: N/A.    Intake/Output from previous day:  Intake/Output Summary (Last 24 hours) at 09/04/14 1423 Last data filed at 09/04/14 1245  Gross per 24 hour  Intake    545 ml  Output   2001 ml  Net  -1456 ml     LAB RESULTS: CBC  Recent Labs Lab 09/01/14 2359 09/03/14 1000 09/04/14 0752  WBC 9.8 6.8 6.5  HGB 13.4 12.2 11.8*  HCT 40.0 37.0 35.1*  PLT 187 155 191  MCV 100.3* 100.3* 97.8  MCH 33.6 33.1 32.9  MCHC 33.5 33.0 33.6  RDW 13.4 13.6 13.6  LYMPHSABS 0.5*  --   --   MONOABS 0.9  --   --   EOSABS 0.2  --   --   BASOSABS 0.1  --   --     Chemistries   Recent Labs Lab 09/01/14 2359 09/03/14 1000 09/04/14 0752  NA 136 141 140  K 3.9 3.6 3.9  CL 98 105 106  CO2 31 27 26   GLUCOSE 127* 93 86  BUN 18 7 6   CREATININE 1.21* 0.95 0.82  CALCIUM 9.1 8.4 8.2*    CBG: No results for input(s): GLUCAP in the last 168 hours.  GFR Estimated Creatinine Clearance: 44.3 mL/min (by C-G formula based on Cr of 0.82).  Coagulation profile No results for input(s): INR, PROTIME in the last 168 hours.  Cardiac Enzymes No results for input(s): CKMB, TROPONINI, MYOGLOBIN in the last 168 hours.  Invalid input(s): CK  Invalid input(s): POCBNP No results for input(s): DDIMER in the last 72 hours. No results for input(s): HGBA1C in the last 72 hours. No results  for input(s): CHOL, HDL, LDLCALC, TRIG, CHOLHDL, LDLDIRECT in the last 72 hours. No results for input(s): TSH, T4TOTAL, T3FREE, THYROIDAB in the last 72 hours.  Invalid input(s): FREET3 No results for input(s): VITAMINB12, FOLATE, FERRITIN, TIBC, IRON, RETICCTPCT in the last 72 hours. No results for input(s): LIPASE, AMYLASE in the last 72 hours.  Urine Studies No results for input(s): UHGB, CRYS in the last 72 hours.  Invalid input(s): UACOL, UAPR, USPG, UPH, UTP, UGL, UKET, UBIL, UNIT, UROB, ULEU, UEPI,  UWBC, URBC, UBAC, CAST, UCOM, BILUA  MICROBIOLOGY: Recent Results (from the past 240 hour(s))  Blood culture (routine x 2)     Status: None (Preliminary result)   Collection Time: 09/02/14 12:09 AM  Result Value Ref Range Status   Specimen Description BLOOD RIGHT WRIST  Final   Special Requests BOTTLES DRAWN AEROBIC AND ANAEROBIC 3CC EA  Final   Culture   Final           BLOOD CULTURE RECEIVED NO GROWTH TO DATE CULTURE WILL BE HELD FOR 5 DAYS BEFORE ISSUING A FINAL NEGATIVE REPORT Performed at [facility]    Report Status PENDING  Incomplete  Blood culture (routine x 2)     Status: None (Preliminary result)   Collection Time: 09/02/14 12:16 AM  Result Value Ref Range Status   Specimen Description BLOOD LEFT WRIST  Final   Special Requests BOTTLES DRAWN AEROBIC AND ANAEROBIC 3CC EA  Final   Culture   Final           BLOOD CULTURE RECEIVED NO GROWTH TO DATE CULTURE WILL BE HELD FOR 5 DAYS BEFORE ISSUING A FINAL NEGATIVE REPORT Performed at [facility]    Report Status PENDING  Incomplete  Urine culture     Status: None   Collection Time: 09/02/14  1:29 AM  Result Value Ref Range Status   Specimen Description URINE, CLEAN CATCH  Final   Special Requests NONE  Final   Colony Count NO GROWTH Performed at [facility]   Final   Culture NO GROWTH Performed at [facility]   Final   Report Status 09/03/2014 FINAL  Final    RADIOLOGY  STUDIES/RESULTS: Ct Angio Chest Pe W/cm &/or Wo Cm  09/04/2014   CLINICAL DATA:  Acute onset of shortness of breath and hypoxemia. Known DVT. Initial encounter.  EXAM: CT ANGIOGRAPHY CHEST WITH CONTRAST  TECHNIQUE: Multidetector CT imaging of the chest was performed using the standard protocol during bolus administration of intravenous contrast. Multiplanar CT image reconstructions and MIPs were obtained to evaluate the vascular anatomy.  CONTRAST:  38mL OMNIPAQUE IOHEXOL 350 MG/ML SOLN  COMPARISON:  CT of the chest performed 05/17/2014, and chest radiograph performed 09/01/2014  FINDINGS: There is no evidence of pulmonary embolus.  Trace bilateral pleural effusions are noted. Underlying interstitial prominence is seen. Mild peripheral scarring is noted at the left lingula. The focal patchy airspace opacity near the right lung apex is relatively stable, though additional new nodular opacities are seen. This may reflect an atypical infection or possibly mild interstitial edema, though as before, a slow-growing malignancy cannot be excluded.  A spiculated nodule is noted within the right lower lobe (image 51 of 93), measuring 1.0 cm in size. This may be slightly increased in size, but is difficult to compare with prior studies. Additional smaller pulmonary nodules are seen within the right lung. There is no evidence of significant focal consolidation, pleural effusion or pneumothorax. No masses are identified; no abnormal focal contrast enhancement is seen.  Mild scattered coronary artery calcification is seen. Prominent precarinal nodes are seen, measuring up to 1.4 cm in short axis. Remaining visualized mediastinal nodes are normal in size, with a calcified subcarinal node seen. No hilar lymphadenopathy is identified. No pericardial effusion is seen.  Scattered calcification is noted along the aortic arch and proximal great vessels. No axillary lymphadenopathy is seen. The visualized portions of the thyroid gland  are unremarkable in appearance.  The visualized portions of the  liver are unremarkable. Scattered calcified granulomata are seen within the liver. A tiny hiatal hernia is seen. The visualized portions of the pancreas and adrenal glands are unremarkable. Mild scattered calcification is noted along the proximal superior mesenteric artery.  No acute osseous abnormalities are seen. Cervical spinal fusion hardware is partially imaged. Chronic compression deformities are seen at T9, T12 and L2.  Review of the MIP images confirms the above findings.  IMPRESSION: 1. No evidence of pulmonary embolus. 2. Trace bilateral pleural effusions. Underlying interstitial prominence noted. Patchy airspace opacities within the right upper lobe may reflect a mild infectious process or possibly mild pulmonary edema. The underlying focal opacity near the right lung apex could reflect atypical infection, though as before, a slow-growing malignancy cannot be excluded. 3. Spiculated 1.0 cm nodule within the right lower lobe. This may be slightly increased in size, but is difficult to compare with prior studies. As previously suggested, would perform follow-up CT of the chest in 3 months, if tissue sampling is not done before then. 4. Underlying smaller pulmonary nodules noted. 5. Prominent precarinal nodes, measuring up to 1.4 cm in short axis. These appear increased in size, and would be amenable to biopsy, as deemed clinically appropriate. 6. Tiny hiatal hernia noted. 7. Mild scattered calcification along the proximal superior mesenteric artery. 8. Chronic compression deformities at T9, T12 and L2.   Electronically Signed   By: Garald Balding M.D.   On: 09/04/2014 05:54   Dg Chest Port 1 View  09/02/2014   CLINICAL DATA:  Shortness of breath tonight.  History of COPD.  EXAM: PORTABLE CHEST - 1 VIEW  COMPARISON:  CT chest 05/17/2014.  Chest 07/08/2011.  FINDINGS: Normal heart size and pulmonary vascularity. Hyperinflation suggesting  emphysema. Scattered interstitial changes in the lungs probably due to fibrosis. No focal consolidation. No blunting of costophrenic angles. No pneumothorax. Calcified and tortuous aorta. Postoperative changes in the cervical spine. Degenerative changes in the shoulders and spine. Old left rib fractures.  IMPRESSION: Emphysematous changes in the lungs with scattered interstitial fibrosis. No focal consolidation.   Electronically Signed   By: Lucienne Capers M.D.   On: 09/02/2014 00:22    Oren Binet, MD  [facility] Pager:[phone]  If 7PM-7AM, please contact night-coverage [email or web address] Password [email or web address] 09/04/2014, 2:23 PM   LOS: 2 days

## 2014-09-04 NOTE — Progress Notes (Signed)
PT Cancellation Note 09/04/2014  Donnella Sham, PT [phone]  (pager) Patient Details Name: Rachel Walters MRN: [id number] DOB: [birth year]   Cancelled Treatment:    Reason Eval/Treat Not Completed: Patient declined, no reason specified. 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  Pt would like to wait until tomorrow for PT,.    Tongela Encinas, Tessie Fass 09/04/2014, 12:58 PM

## 2014-09-04 NOTE — Discharge Instructions (Addendum)
Information on my medicine - ELIQUIS (apixaban)  Why was Eliquis prescribed for you? Eliquis was prescribed to treat blood clots that may have been found in the veins of your legs (deep vein thrombosis) or in your lungs (pulmonary embolism) and to reduce the risk of them occurring again.  What do You need to know about Eliquis ? The starting dose is 10 mg (two 5 mg tablets) taken TWICE daily for the FIRST SEVEN (7) DAYS, then on 09/10/14   the dose is reduced to ONE 5 mg tablet taken TWICE daily.  Eliquis may be taken with or without food.   Try to take the dose about the same time in the morning and in the evening. If you have difficulty swallowing the tablet whole please discuss with your pharmacist how to take the medication safely.  Take Eliquis exactly as prescribed and DO NOT stop taking Eliquis without talking to the doctor who prescribed the medication.  Stopping may increase your risk of developing a new blood clot.  Refill your prescription before you run out.  After discharge, you should have regular check-up appointments with your healthcare provider that is prescribing your Eliquis.    What do you do if you miss a dose? If a dose of ELIQUIS is not taken at the scheduled time, take it as soon as possible on the same day and twice-daily administration should be resumed. The dose should not be doubled to make up for a missed dose.  Important Safety Information A possible side effect of Eliquis is bleeding. You should call your healthcare provider right away if you experience any of the following: ? Bleeding from an injury or your nose that does not stop. ? Unusual colored urine (red or dark brown) or unusual colored stools (red or black). ? Unusual bruising for unknown reasons. ? A serious fall or if you hit your head (even if there is no bleeding).  Some medicines may interact with Eliquis and might increase your risk of bleeding or clotting while on Eliquis. To help  avoid this, consult your healthcare provider or pharmacist prior to using any new prescription or non-prescription medications, including herbals, vitamins, non-steroidal anti-inflammatory drugs (NSAIDs) and supplements.  This website has more information on Eliquis (apixaban): http://www.eliquis.com/eliquis/home

## 2014-09-04 NOTE — Consult Note (Signed)
Cardiologist:  New Reason for Consult: Afib RVR Referring Physician:  [name] is an 81 y.o. female.  HPI:   The patient is an 30 female with a history of COPD on home O2, HTN, esophageal dysmotility, neuromuscular disorder, interstitial lung disease.  She was admitted with sepsis thought to be a pulmonary source from chronic aspiration.  She also has a DVT and was started on Eliquis.  CTA negative for PE.   We are asked to see for Afib.   The patient reports that she could feel her heart rate increase this morning and became more short of breath as well.  She denies N, V, chest pain/tightness, LEE, dizziness, Abd pain.  Past Medical History  Diagnosis Date  . Arthritis   . COPD (chronic obstructive pulmonary disease)   . Hypertension   . Headache(784.0)   . Neuromuscular disorder   . Asthmatic bronchitis   . Esophageal reflux 05/19/2012  . Dyspnea 04/23/2014  . ILD (interstitial lung disease) 05/24/2014  . H. pylori infection   . Hiatal hernia   . Presbyesophagus   . Esophageal dysmotility   . Leg DVT (deep venous thromboembolism), acute 09/03/2014    Past Surgical History  Procedure Laterality Date  . Neck surgery  1992 and 1993  . Balloon dilation  05/19/2012    Procedure: BALLOON DILATION;  Surgeon: Lafayette Dragon, MD;  Location: [facility];  Service: Endoscopy;  Laterality: N/A;    Family History  Problem Relation Age of Onset  . Diabetes Mother   . Diabetes Sister   . Diabetes Maternal Aunt   . Breast cancer Sister     Social History:  reports that she quit smoking about 3 years ago. Her smoking use included Cigarettes. She has a 25 pack-year smoking history. She has never used smokeless tobacco. She reports that she does not drink alcohol or use illicit drugs.  Allergies:  Allergies  Allergen Reactions  . Sulfa Antibiotics Other (See Comments)    Skin turned yellow    Medications:  Scheduled Meds: . acyclovir  800 mg Oral QHS  . [START ON  09/10/2014] apixaban  5 mg Oral BID   Followed by  . apixaban  10 mg Oral BID  . calcium-vitamin D  1 tablet Oral Q breakfast  . ceFEPime (MAXIPIME) IV  1 g Intravenous Q24H  . cholecalciferol  1,000 Units Oral Daily  . diltiazem  30 mg Oral 4 times per day  . ipratropium  0.25 mg Nebulization 3 times per day  . levalbuterol  0.63 mg Nebulization Q8H  . pantoprazole  40 mg Oral Daily  . pravastatin  20 mg Oral QPM  . vancomycin  750 mg Intravenous Q24H   Continuous Infusions: . sodium chloride 125 mL/hr at 09/03/14 2025  . sodium chloride 100 mL/hr at 09/04/14 0331   PRN Meds:.acetaminophen **OR** acetaminophen, alum & mag hydroxide-simeth, HYDROmorphone (DILAUDID) injection, ondansetron **OR** ondansetron (ZOFRAN) IV, oxyCODONE   Results for orders placed or performed during the hospital encounter of 09/01/14 (from the past 48 hour(s))  Basic metabolic panel     Status: Abnormal   Collection Time: 09/03/14 10:00 AM  Result Value Ref Range   Sodium 141 135 - 145 mmol/L   Potassium 3.6 3.5 - 5.1 mmol/L   Chloride 105 96 - 112 mmol/L   CO2 27 19 - 32 mmol/L   Glucose, Bld 93 70 - 99 mg/dL   BUN 7 6 - 23 mg/dL   Creatinine,  Ser 0.95 0.50 - 1.10 mg/dL   Calcium 8.4 8.4 - 10.5 mg/dL   GFR calc non Af Amer 53 (L) >90 mL/min   GFR calc Af Amer 61 (L) >90 mL/min    Comment: (NOTE) The eGFR has been calculated using the CKD EPI equation. This calculation has not been validated in all clinical situations. eGFR's persistently <90 mL/min signify possible Chronic Kidney Disease.    Anion gap 9 5 - 15  CBC     Status: Abnormal   Collection Time: 09/03/14 10:00 AM  Result Value Ref Range   WBC 6.8 4.0 - 10.5 K/uL   RBC 3.69 (L) 3.87 - 5.11 MIL/uL   Hemoglobin 12.2 12.0 - 15.0 g/dL   HCT 37.0 36.0 - 46.0 %   MCV 100.3 (H) 78.0 - 100.0 fL   MCH 33.1 26.0 - 34.0 pg   MCHC 33.0 30.0 - 36.0 g/dL   RDW 13.6 11.5 - 15.5 %   Platelets 155 150 - 400 K/uL  Basic metabolic panel     Status:  Abnormal   Collection Time: 09/04/14  7:52 AM  Result Value Ref Range   Sodium 140 135 - 145 mmol/L   Potassium 3.9 3.5 - 5.1 mmol/L   Chloride 106 96 - 112 mmol/L   CO2 26 19 - 32 mmol/L   Glucose, Bld 86 70 - 99 mg/dL   BUN 6 6 - 23 mg/dL   Creatinine, Ser 0.82 0.50 - 1.10 mg/dL   Calcium 8.2 (L) 8.4 - 10.5 mg/dL   GFR calc non Af Amer 63 (L) >90 mL/min   GFR calc Af Amer 73 (L) >90 mL/min    Comment: (NOTE) The eGFR has been calculated using the CKD EPI equation. This calculation has not been validated in all clinical situations. eGFR's persistently <90 mL/min signify possible Chronic Kidney Disease.    Anion gap 8 5 - 15  CBC     Status: Abnormal   Collection Time: 09/04/14  7:52 AM  Result Value Ref Range   WBC 6.5 4.0 - 10.5 K/uL   RBC 3.59 (L) 3.87 - 5.11 MIL/uL   Hemoglobin 11.8 (L) 12.0 - 15.0 g/dL   HCT 35.1 (L) 36.0 - 46.0 %   MCV 97.8 78.0 - 100.0 fL   MCH 32.9 26.0 - 34.0 pg   MCHC 33.6 30.0 - 36.0 g/dL   RDW 13.6 11.5 - 15.5 %   Platelets 191 150 - 400 K/uL    Ct Angio Chest Pe W/cm &/or Wo Cm  09/04/2014   CLINICAL DATA:  Acute onset of shortness of breath and hypoxemia. Known DVT. Initial encounter.  EXAM: CT ANGIOGRAPHY CHEST WITH CONTRAST  TECHNIQUE: Multidetector CT imaging of the chest was performed using the standard protocol during bolus administration of intravenous contrast. Multiplanar CT image reconstructions and MIPs were obtained to evaluate the vascular anatomy.  CONTRAST:  37m OMNIPAQUE IOHEXOL 350 MG/ML SOLN  COMPARISON:  CT of the chest performed 05/17/2014, and chest radiograph performed 09/01/2014  FINDINGS: There is no evidence of pulmonary embolus.  Trace bilateral pleural effusions are noted. Underlying interstitial prominence is seen. Mild peripheral scarring is noted at the left lingula. The focal patchy airspace opacity near the right lung apex is relatively stable, though additional new nodular opacities are seen. This may reflect an  atypical infection or possibly mild interstitial edema, though as before, a slow-growing malignancy cannot be excluded.  A spiculated nodule is noted within the right lower lobe (image 51  of 93), measuring 1.0 cm in size. This may be slightly increased in size, but is difficult to compare with prior studies. Additional smaller pulmonary nodules are seen within the right lung. There is no evidence of significant focal consolidation, pleural effusion or pneumothorax. No masses are identified; no abnormal focal contrast enhancement is seen.  Mild scattered coronary artery calcification is seen. Prominent precarinal nodes are seen, measuring up to 1.4 cm in short axis. Remaining visualized mediastinal nodes are normal in size, with a calcified subcarinal node seen. No hilar lymphadenopathy is identified. No pericardial effusion is seen.  Scattered calcification is noted along the aortic arch and proximal great vessels. No axillary lymphadenopathy is seen. The visualized portions of the thyroid gland are unremarkable in appearance.  The visualized portions of the liver are unremarkable. Scattered calcified granulomata are seen within the liver. A tiny hiatal hernia is seen. The visualized portions of the pancreas and adrenal glands are unremarkable. Mild scattered calcification is noted along the proximal superior mesenteric artery.  No acute osseous abnormalities are seen. Cervical spinal fusion hardware is partially imaged. Chronic compression deformities are seen at T9, T12 and L2.  Review of the MIP images confirms the above findings.  IMPRESSION: 1. No evidence of pulmonary embolus. 2. Trace bilateral pleural effusions. Underlying interstitial prominence noted. Patchy airspace opacities within the right upper lobe may reflect a mild infectious process or possibly mild pulmonary edema. The underlying focal opacity near the right lung apex could reflect atypical infection, though as before, a slow-growing malignancy  cannot be excluded. 3. Spiculated 1.0 cm nodule within the right lower lobe. This may be slightly increased in size, but is difficult to compare with prior studies. As previously suggested, would perform follow-up CT of the chest in 3 months, if tissue sampling is not done before then. 4. Underlying smaller pulmonary nodules noted. 5. Prominent precarinal nodes, measuring up to 1.4 cm in short axis. These appear increased in size, and would be amenable to biopsy, as deemed clinically appropriate. 6. Tiny hiatal hernia noted. 7. Mild scattered calcification along the proximal superior mesenteric artery. 8. Chronic compression deformities at T9, T12 and L2.   Electronically Signed   By: Garald Balding M.D.   On: 09/04/2014 05:54    Review of Systems  Constitutional: Negative for fever and diaphoresis.  HENT: Negative for congestion.   Respiratory: Positive for shortness of breath. Negative for cough.   Cardiovascular: Positive for palpitations. Negative for chest pain, orthopnea, leg swelling and PND.  Gastrointestinal: Negative for nausea, vomiting, abdominal pain, blood in stool and melena.  Musculoskeletal: Negative for myalgias.  Neurological: Negative for dizziness.  All other systems reviewed and are negative.  Blood pressure 149/72, pulse 110, temperature 98.2 F (36.8 C), temperature source Oral, resp. rate 18, height '5\' 5"'  (1.651 m), weight 135 lb (61.236 kg), SpO2 98 %. Physical Exam  Nursing note and vitals reviewed. Constitutional: She is oriented to person, place, and time. She appears well-developed and well-nourished. No distress.  HENT:  Head: Normocephalic and atraumatic.  Eyes: EOM are normal. Pupils are equal, round, and reactive to light. No scleral icterus.  Neck: Normal range of motion. Neck supple. No JVD present.  Cardiovascular: S1 normal and S2 normal.  Tachycardia present.   No murmur heard. Pulses:      Radial pulses are 2+ on the right side, and 2+ on the left  side.       Posterior tibial pulses are 1+ on the right side,  and 1+ on the left side.  No carotid bruit  Respiratory: Effort normal. She has no wheezes. She has no rales.  Decreased BS throughout.    GI: Soft. Bowel sounds are normal. She exhibits no distension.  Musculoskeletal: She exhibits edema (Trace ankle edema).  Lymphadenopathy:    She has no cervical adenopathy.  Neurological: She is alert and oriented to person, place, and time. She exhibits normal muscle tone.  Skin: Skin is warm and dry.  Psychiatric: She has a normal mood and affect.    Assessment/Plan: Principal Problem:   Sepsis Active Problems:   Essential hypertension   COPD (chronic obstructive pulmonary disease)   Chronic hypoxemic respiratory failure   Hypotension   SIRS (systemic inflammatory response syndrome)   Acute on chronic respiratory failure   Leg DVT (deep venous thromboembolism), acute   MAT  40 female with a history of COPD on home O2, HTN, esophageal dysmotility, neuromuscular disorder, interstitial lung disease.  I repeated her EKG and it does look like MAT with PACs.  She has the appropriate constellation of comorbities that favor it.  PE is certainly a consideration.  Her CTA was negative.  I recommend switching to IV diltiazem to get her under better control and then switch back to PO.  Avoid beta blockers with lung disease.   We will check an echo once her HR slows.     Tarri Fuller, [place] 09/04/2014, 2:17 PM   Patient seen, examined. Available data reviewed. Agree with findings, assessment, and plan as outlined by Tarri Fuller, PA-C. The patient was independently interviewed and examined. Exam reveals an irregularly irregular heart rate without significant murmur or gallop. Lung fields are coarse with scattered rhonchi, there is no peripheral edema present. EKG was reviewed and does show an irregular narrow complex tachycardia with variable P-wave morphologies consistent with multifocal atrial  tachycardia. I do not see evidence of atrial fibrillation. The patient has atypical clinical scenario to develop this rhythm with an acute on chronic pulmonary process. Will change her from low-dose oral diltiazem to IV diltiazem to better control her heart rate. Anticipate that we would be able to switch her back to oral diltiazem after her rate is better controlled. Will repeat a 2-D echocardiogram to assess LV function and make sure there has been no significant change. Will follow with you. thx  Sherren Mocha, M.D. 09/04/2014 3:38 PM

## 2014-09-04 NOTE — Care Management Note (Addendum)
    Page 1 of 2   09/09/2014     2:52:06 PM CARE MANAGEMENT NOTE 09/09/2014  Patient:  Rachel Walters, Rachel Walters   Account Number:  [id number]  Date Initiated:  09/04/2014  Documentation initiated by:  [facility]  Subjective/Objective Assessment:   dx sepsis, acute/chronic resp failure, copd, dvt r leg.  admit- lives alone, but grand daughter will be coming to stay with her after dc. Patient has home oxygen with [employer] , she is on 3 liters at home.     Action/Plan:   pt eval- rec hhpt   Anticipated DC Date:  09/05/2014   Anticipated DC Plan:  [place]  CM consult      [facility] Choice  HOME HEALTH   Choice offered to / List presented to:  C-1 Patient        [place] arranged  HH-2 PT  HH-1 RN  [place].   Status of service:  Completed, signed off Medicare Important Message given?  YES (If response is "NO", the following Medicare IM given date fields will be blank) Date Medicare IM given:  09/04/2014 Medicare IM given by:  Tomi Bamberger Date Additional Medicare IM given:  09/05/2014 Additional Medicare IM given by:  Jacqlyn Krauss  Discharge Disposition:  [place]  Per UR Regulation:  Reviewed for med. necessity/level of care/duration of stay  If discussed at [place] of Stay Meetings, dates discussed:   09/10/2014    Comments:  Medicare Important Message given? YES (If response is "NO", the following Medicare IM given date fields will be blank) Date Medicare IM given: Medicare IM given by: Jacqlyn Krauss 09-09-14 Plan for d/c to [facility] today. No further needs from CM at this time. Jacqlyn Krauss, RN,BSN [phone]   09-05-14 Jacqlyn Krauss, RN,BSN [phone] CM spoke to pt and she would like to add a HHRN to services as well. Benefits check for eliquis in process. Pt uses [facility] in [place] , [place] provided tp with the  30 day free eliquis card. Pt will need a Rx once medially stable for d/c. Pt uses [facility] for Medications 90 day supply. CM did call [facility] to see if Eliquis is in stock and they have the 5 mg tablets. Please reflect Rx. Unable to get a copay at this time- pharmacy stated they would need a Rx written. RN may could fax Rx to [facility] # [phone]. No further needs from CM at this time.   09/04/14 Anthony, BSN [phone] patient lives alone, but she states her granddaughter will be coming to stay with her at dc.  Patient chose [facility] for hhpt, referral made to [facility] , [facility] notified.  Soc will begin 24-48 hrs post dc.  Patient has home oxygen with [employer] already she is on 3liters at home.  Patient has transport at dc.

## 2014-09-05 DIAGNOSIS — R000 Tachycardia, unspecified: Secondary | ICD-10-CM

## 2014-09-05 DIAGNOSIS — R0600 Dyspnea, unspecified: Secondary | ICD-10-CM

## 2014-09-05 DIAGNOSIS — I471 Supraventricular tachycardia: Secondary | ICD-10-CM | POA: Diagnosis present

## 2014-09-05 DIAGNOSIS — J42 Unspecified chronic bronchitis: Secondary | ICD-10-CM

## 2014-09-05 LAB — BASIC METABOLIC PANEL
Anion gap: 10 (ref 5–15)
BUN: 7 mg/dL (ref 6–23)
CO2: 23 mmol/L (ref 19–32)
Calcium: 8.3 mg/dL — ABNORMAL LOW (ref 8.4–10.5)
Chloride: 105 mmol/L (ref 96–112)
Creatinine, Ser: 0.81 mg/dL (ref 0.50–1.10)
GFR calc Af Amer: 74 mL/min — ABNORMAL LOW (ref >90)
GFR calc non Af Amer: 64 mL/min — ABNORMAL LOW (ref >90)
GLUCOSE: 74 mg/dL (ref 70–99)
POTASSIUM: 3.3 mmol/L — AB (ref 3.5–5.1)
Sodium: 138 mmol/L (ref 135–145)

## 2014-09-05 LAB — CBC
HCT: 35.5 % — ABNORMAL LOW (ref 36.0–46.0)
HEMOGLOBIN: 11.9 g/dL — AB (ref 12.0–15.0)
MCH: 33.4 pg (ref 26.0–34.0)
MCHC: 33.5 g/dL (ref 30.0–36.0)
MCV: 99.7 fL (ref 78.0–100.0)
Platelets: 196 10*3/uL (ref 150–400)
RBC: 3.56 MIL/uL — AB (ref 3.87–5.11)
RDW: 13.6 % (ref 11.5–15.5)
WBC: 6.2 10*3/uL (ref 4.0–10.5)

## 2014-09-05 MED ORDER — IPRATROPIUM BROMIDE 0.02 % IN SOLN
0.2500 mg | Freq: Three times a day (TID) | RESPIRATORY_TRACT | Status: DC
  Administered 2014-09-05 – 2014-09-07 (×5): 0.25 mg via RESPIRATORY_TRACT
  Administered 2014-09-07 (×2): 0.5 mg via RESPIRATORY_TRACT
  Administered 2014-09-08: 0.25 mg via RESPIRATORY_TRACT
  Administered 2014-09-08 (×2): 0.5 mg via RESPIRATORY_TRACT
  Administered 2014-09-09 (×2): 0.25 mg via RESPIRATORY_TRACT
  Filled 2014-09-05 (×12): qty 2.5

## 2014-09-05 MED ORDER — POTASSIUM CHLORIDE CRYS ER 20 MEQ PO TBCR
40.0000 meq | EXTENDED_RELEASE_TABLET | Freq: Once | ORAL | Status: AC
  Administered 2014-09-05: 40 meq via ORAL
  Filled 2014-09-05: qty 2

## 2014-09-05 MED ORDER — LEVALBUTEROL HCL 0.63 MG/3ML IN NEBU
0.6300 mg | INHALATION_SOLUTION | Freq: Three times a day (TID) | RESPIRATORY_TRACT | Status: DC
  Administered 2014-09-05 – 2014-09-09 (×12): 0.63 mg via RESPIRATORY_TRACT
  Filled 2014-09-05 (×12): qty 3

## 2014-09-05 MED ORDER — DILTIAZEM HCL 60 MG PO TABS
60.0000 mg | ORAL_TABLET | Freq: Four times a day (QID) | ORAL | Status: DC
  Administered 2014-09-05 – 2014-09-06 (×4): 60 mg via ORAL
  Filled 2014-09-05 (×4): qty 1

## 2014-09-05 NOTE — Progress Notes (Signed)
  Echocardiogram 2D Echocardiogram has been performed.  Lysle Rubens 09/05/2014, 3:30 PM

## 2014-09-05 NOTE — Progress Notes (Signed)
PT Cancellation Note  Patient Details Name: Rachel Walters MRN: [id number] DOB: March 03, 1926   Cancelled Treatment:    Reason Eval/Treat Not Completed: Other (comment) (Refused stating she got no sleep last night.)   Irwin Brakeman F 09/05/2014, 2:03 PM Narvel Kozub,PT Acute Rehabilitation [phone] (pager)

## 2014-09-05 NOTE — Progress Notes (Signed)
Subjective: Tired after being up to BR, HR up 125, with increased HR + SOB no chest pain  Objective: Vital signs in last 24 hours: Temp:  [98.2 F (36.8 C)-99.5 F (37.5 C)] 98.6 F (37 C) (02/11 0404) Pulse Rate:  [92-140] 92 (02/11 0745) Resp:  [18-31] 21 (02/11 0745) BP: (101-155)/(50-81) 101/50 mmHg (02/11 0745) SpO2:  [96 %-100 %] 97 % (02/11 0745) Weight change:  Last BM Date: 09/03/14 Intake/Output from previous day: +195 02/10 0701 - 02/11 0700 In: 1982.5 [P.O.:240; I.V.:1592.5; IV Piggyback:150] Out: 500 [Urine:500] Intake/Output this shift:    PE: General:Pleasant affect, NAD Skin:Warm and dry, brisk capillary refill HEENT:normocephalic, sclera clear, mucus membranes moist Heart:S1S2 irreg  With soft murmur, no gallup, rub or click Lungs:few crackles in the bases, no rhonchi, or wheezes IPJ:ASNK, non tender, + BS, do not palpate liver spleen or masses Ext:no lower ext edema, 2+ pedal pulses, 2+ radial pulses Neuro:alert and oriented X 3 , MAE, follows commands, + facial symmetry Tele: SR at times then MF atrial tach at other times up to 125 on IV dilt   Lab Results:  Recent Labs  09/04/14 0752 09/05/14 0444  WBC 6.5 6.2  HGB 11.8* 11.9*  HCT 35.1* 35.5*  PLT 191 196   BMET  Recent Labs  09/04/14 0752 09/05/14 0444  NA 140 138  K 3.9 3.3*  CL 106 105  CO2 26 23  GLUCOSE 86 74  BUN 6 7  CREATININE 0.82 0.81  CALCIUM 8.2* 8.3*   Studies/Results: Ct Angio Chest Pe W/cm &/or Wo Cm  09/04/2014   CLINICAL DATA:  Acute onset of shortness of breath and hypoxemia. Known DVT. Initial encounter.  EXAM: CT ANGIOGRAPHY CHEST WITH CONTRAST  TECHNIQUE: Multidetector CT imaging of the chest was performed using the standard protocol during bolus administration of intravenous contrast. Multiplanar CT image reconstructions and MIPs were obtained to evaluate the vascular anatomy.  CONTRAST:  81mL OMNIPAQUE IOHEXOL 350 MG/ML SOLN  COMPARISON:  CT of the  chest performed 05/17/2014, and chest radiograph performed 09/01/2014  FINDINGS: There is no evidence of pulmonary embolus.  Trace bilateral pleural effusions are noted. Underlying interstitial prominence is seen. Mild peripheral scarring is noted at the left lingula. The focal patchy airspace opacity near the right lung apex is relatively stable, though additional new nodular opacities are seen. This may reflect an atypical infection or possibly mild interstitial edema, though as before, a slow-growing malignancy cannot be excluded.  A spiculated nodule is noted within the right lower lobe (image 51 of 93), measuring 1.0 cm in size. This may be slightly increased in size, but is difficult to compare with prior studies. Additional smaller pulmonary nodules are seen within the right lung. There is no evidence of significant focal consolidation, pleural effusion or pneumothorax. No masses are identified; no abnormal focal contrast enhancement is seen.  Mild scattered coronary artery calcification is seen. Prominent precarinal nodes are seen, measuring up to 1.4 cm in short axis. Remaining visualized mediastinal nodes are normal in size, with a calcified subcarinal node seen. No hilar lymphadenopathy is identified. No pericardial effusion is seen.  Scattered calcification is noted along the aortic arch and proximal great vessels. No axillary lymphadenopathy is seen. The visualized portions of the thyroid gland are unremarkable in appearance.  The visualized portions of the liver are unremarkable. Scattered calcified granulomata are seen within the liver. A tiny hiatal hernia is seen. The visualized portions of the  pancreas and adrenal glands are unremarkable. Mild scattered calcification is noted along the proximal superior mesenteric artery.  No acute osseous abnormalities are seen. Cervical spinal fusion hardware is partially imaged. Chronic compression deformities are seen at T9, T12 and L2.  Review of the MIP  images confirms the above findings.  IMPRESSION: 1. No evidence of pulmonary embolus. 2. Trace bilateral pleural effusions. Underlying interstitial prominence noted. Patchy airspace opacities within the right upper lobe may reflect a mild infectious process or possibly mild pulmonary edema. The underlying focal opacity near the right lung apex could reflect atypical infection, though as before, a slow-growing malignancy cannot be excluded. 3. Spiculated 1.0 cm nodule within the right lower lobe. This may be slightly increased in size, but is difficult to compare with prior studies. As previously suggested, would perform follow-up CT of the chest in 3 months, if tissue sampling is not done before then. 4. Underlying smaller pulmonary nodules noted. 5. Prominent precarinal nodes, measuring up to 1.4 cm in short axis. These appear increased in size, and would be amenable to biopsy, as deemed clinically appropriate. 6. Tiny hiatal hernia noted. 7. Mild scattered calcification along the proximal superior mesenteric artery. 8. Chronic compression deformities at T9, T12 and L2.   Electronically Signed   By: Garald Balding M.D.   On: 09/04/2014 05:54    Medications: I have reviewed the patient's current medications. Scheduled Meds: . acyclovir  800 mg Oral QHS  . [START ON 09/10/2014] apixaban  5 mg Oral BID   Followed by  . apixaban  10 mg Oral BID  . calcium-vitamin D  1 tablet Oral Q breakfast  . ceFEPime (MAXIPIME) IV  1 g Intravenous Q24H  . cholecalciferol  1,000 Units Oral Daily  . ipratropium  0.25 mg Nebulization 3 times per day  . levalbuterol  0.63 mg Nebulization Q8H  . pantoprazole  40 mg Oral Daily  . pravastatin  20 mg Oral QPM   Continuous Infusions: . sodium chloride 50 mL/hr at 09/04/14 1446  . diltiazem (CARDIZEM) infusion 15 mg/hr (09/05/14 0403)   PRN Meds:.acetaminophen **OR** acetaminophen, alum & mag hydroxide-simeth, HYDROmorphone (DILAUDID) injection, ondansetron **OR**  ondansetron (ZOFRAN) IV, oxyCODONE  Assessment/Plan:  49 female with a history of COPD on home O2, HTN, esophageal dysmotility, neuromuscular disorder, interstitial lung disease. She was admitted with sepsis thought to be a pulmonary source from chronic aspiration. She also has a DVT and was started on Eliquis. CTA negative for PE.Though abnormal nodes and small bilateral pleural effusions.    We are asked to see for multifocal atrial tach-placed on IV dilt with plan to change to po. Plan for echo  Principal Problem:   Sepsis followed by IM  Active Problems:   Multifocal atrial tach- on IV dilt though still not well controlled.  On dilt alone, with respiratory status may not be able to use BB, change to po dilt today 60 every 6 hours?  And wean off IV dilt.    COPD (chronic obstructive pulmonary disease)   Chronic hypoxemic respiratory failure   Hypertension BP 155/81 -101/50    SIRS (systemic inflammatory response syndrome)   Acute on chronic respiratory failure   Leg DVT (deep venous thromboembolism), acute- on eliquis   hypokalemia replace - 40 meq now.   LOS: 3 days   Time spent with pt. :15 minutes. [facility] R  Nurse Practitioner Certified Pager [phone] or after 5pm and on weekends call [phone] 09/05/2014, 8:16 AM   Patient seen and  examined and history reviewed. Agree with above findings and plan. Patient states breathing is better. Still notes heart racing when she gets up. Monitor reveals MAT- rate under fairly good control. MAT is directly related to her pulmonary and medical issues. Treatment is really treatment of her underlying conditions. Cardizem may help with rate control a little but I think can be changed to PO today. Echo ordered. Echo in October was normal.   Rachel Walters, Horace 09/05/2014 10:03 AM

## 2014-09-05 NOTE — Progress Notes (Signed)
Pt's Hr in the 120's-130's. Np on call made aware new order received to titrate cardizem. Will cont to monitor pt.

## 2014-09-05 NOTE — Progress Notes (Signed)
ANTIBIOTIC CONSULT NOTE - FOLLOW UP  Pharmacy Consult for Cefepime, Eliquis Indication: r/o sepsis, +DVT  Allergies  Allergen Reactions  . Sulfa Antibiotics Other (See Comments)    Skin turned yellow    Patient Measurements: Height: 5\' 5"  (165.1 cm) Weight: 135 lb (61.236 kg) IBW/kg (Calculated) : 57 Adjusted Body Weight:   Vital Signs: Temp: 98.6 F (37 C) (02/11 0404) Temp Source: Oral (02/11 0404) BP: 101/50 mmHg (02/11 0745) Pulse Rate: 92 (02/11 0745) Intake/Output from previous day: 02/10 0701 - 02/11 0700 In: 1982.5 [P.O.:240; I.V.:1592.5; IV Piggyback:150] Out: 500 [Urine:500] Intake/Output from this shift:    Labs:  Recent Labs  09/03/14 1000 09/04/14 0752 09/05/14 0444  WBC 6.8 6.5 6.2  HGB 12.2 11.8* 11.9*  PLT 155 191 196  CREATININE 0.95 0.82 0.81   Estimated Creatinine Clearance: 44.9 mL/min (by C-G formula based on Cr of 0.81). No results for input(s): VANCOTROUGH, VANCOPEAK, VANCORANDOM, GENTTROUGH, GENTPEAK, GENTRANDOM, TOBRATROUGH, TOBRAPEAK, TOBRARND, AMIKACINPEAK, AMIKACINTROU, AMIKACIN in the last 72 hours.   Microbiology:   Anti-infectives    Start     Dose/Rate Route Frequency Ordered Stop   09/04/14 0600  vancomycin (VANCOCIN) IVPB 1000 mg/200 mL premix  Status:  Discontinued     1,000 mg 200 mL/hr over 60 Minutes Intravenous Every 48 hours 09/02/14 0319 09/02/14 1431   09/03/14 0400  vancomycin (VANCOCIN) IVPB 750 mg/150 ml premix  Status:  Discontinued     750 mg 150 mL/hr over 60 Minutes Intravenous Every 24 hours 09/02/14 1431 09/04/14 1432   09/02/14 2200  acyclovir (ZOVIRAX) tablet 800 mg     800 mg Oral Daily at bedtime 09/02/14 0459     09/02/14 2200  ceFEPIme (MAXIPIME) 1 g in dextrose 5 % 50 mL IVPB     1 g 100 mL/hr over 30 Minutes Intravenous Every 24 hours 09/02/14 0319     09/02/14 0000  vancomycin (VANCOCIN) IVPB 1000 mg/200 mL premix     1,000 mg 200 mL/hr over 60 Minutes Intravenous  Once 09/01/14 2357 09/02/14  0158   09/02/14 0000  ceFEPIme (MAXIPIME) 1 g in dextrose 5 % 50 mL IVPB     1 g 100 mL/hr over 30 Minutes Intravenous  Once 09/01/14 2357 09/02/14 0059      Assessment: Fevers, cough, SOB  Anticoagulation: Eliquis for DVT in R popliteal and posterior tibial veins. CrCl 44. CBC stable.  Infectious Disease: Vancomycin (d/c'd) + Cefepime for r/o sepsis/PNA. Possibly aspiration pneumonitis. Acyclovir resumed from PTA for ?HSV suppression? - has been on since '11 per dispense report. Tmax 99.5, WBC wnl, SCr 0.81 down, CrCl~45  Acyclovir PTA >> current Vanc 2/8 >>2/10 Cefepime 2/8 >>  2/8 UCx >> Negative 2/8 BCx >> ngtd 2/8 Influenza PCR: neg  Cardiovascular: Hx HTN. BP 101/50, HR 92 (has been in atrial tach). Meds: IV>>po Diltiazem,pravachol/  Endocrinology: No hx. CBG 74 on BMET  Gastrointestinal / Nutrition: Hx GERD/hiatal hernia/esophageal dysmotility (s/p balloon dilation). No LFTs. On [place] diet, PPI po, OscdalD, Vit D  Neurology: Hx HA's.   Nephrology: SCr 0.81 down.  Pulmonary: Hx COPD (O2 dependent)/ILD. Chronic resp failure on 3L [place] at home.3L. Xopenex/Atroven nebx.  Hematology / Oncology: Hgb 11.9 stable  PTA Medication Issues: tudorza, advair, , norvasc, calc/vitD, clorazepate, lisinopril, lopressor, tramadol  Best Practices: Eliquis  Goal of Therapy:  Therapeutic oral anticoagulation Treatment of infection  Plan:  -Vancomycin d/c'd 2/10 -Cefepime 1g IV q24h. - Eliquis 10 mg po thru 2/15, 5 mg po bid  starting 2/16   Rachel Walters S. Alford Highland, PharmD, BCPS Clinical Staff Pharmacist Pager [phone]  Rachel Walters 09/05/2014,9:38 AM

## 2014-09-05 NOTE — Progress Notes (Signed)
PATIENT DETAILS Name: Rachel Walters Age: 81 y.o. Sex: female Date of Birth: [birth year] Admit Date: 09/01/2014 Admitting Physician Orson Eva, MD [id number] R, MD  Subjective: No major issues-feels better better the past few days.  Assessment/Plan: Principal Problem:   Sepsis: Current suspicion is from aspiration pneumonitis. Sepsis pathophysiology has resolved with  empiric antibiotics. Blood cultures from 2/8 negative so far.  Active Problems:  ? Aspiration pneumonitis: Although no evidence of consolidation in x-ray chest and CT chest, given history of esophageal dysmotility and no other foci of infection apparent to explain the fever and sepsis pathophysiology on admission, currently presumed that she may have aspiration pneumonitis. Blood cultures negative, vancomycin discontinued on 2/10, continue with cefepime. If continues to improve we can transition to Augmentin on discharge.    Multifocal atrial tachycardia: Hospital course complicated by development of MAT, seen by cardiology and started on IV Cardizem with good rate control. Cardiology recommending that we transition to oral Cardizem. Respiratory issues currently seem to be stable.     Leg DVT (deep venous thromboembolism), acute: Started on  Eliquis.CTangiogram of chest negative.      History of COPD on chronic home O2: Lungs seem to be clearing this morning, change bronchodilators to Xopenex and Atrovent. Continue on oxygen.     History of chronic hypoxemic respiratory failure: Secondary to COPD, on home O2.    Essential hypertension: Currently controlled with Cardizem. Lisinopril, amlodipine and metoprolol were discontinued on admission because of hypotension.     History of interstitial lung disease: Continue outpatient follow-up with pulmonary.autoimmune workup negative     Spiculated 1.0 cm nodule within the right lower lobe: Outpatient follow-up with pulmonology  Disposition: Remain inpatient-home in  1-2 days  Antibiotics:  See below  Anti-infectives    Start     Dose/Rate Route Frequency Ordered Stop   09/04/14 0600  vancomycin (VANCOCIN) IVPB 1000 mg/200 mL premix  Status:  Discontinued     1,000 mg 200 mL/hr over 60 Minutes Intravenous Every 48 hours 09/02/14 0319 09/02/14 1431   09/03/14 0400  vancomycin (VANCOCIN) IVPB 750 mg/150 ml premix  Status:  Discontinued     750 mg 150 mL/hr over 60 Minutes Intravenous Every 24 hours 09/02/14 1431 09/04/14 1432   09/02/14 2200  acyclovir (ZOVIRAX) tablet 800 mg     800 mg Oral Daily at bedtime 09/02/14 0459     09/02/14 2200  ceFEPIme (MAXIPIME) 1 g in dextrose 5 % 50 mL IVPB     1 g 100 mL/hr over 30 Minutes Intravenous Every 24 hours 09/02/14 0319     09/02/14 0000  vancomycin (VANCOCIN) IVPB 1000 mg/200 mL premix     1,000 mg 200 mL/hr over 60 Minutes Intravenous  Once 09/01/14 2357 09/02/14 0158   09/02/14 0000  ceFEPIme (MAXIPIME) 1 g in dextrose 5 % 50 mL IVPB     1 g 100 mL/hr over 30 Minutes Intravenous  Once 09/01/14 2357 09/02/14 0059      DVT Prophylaxis: Eliquis  Code Status: Full code  Family Communication None at bedside  Procedures:  None  CONSULTS:  cardiology  MEDICATIONS: Scheduled Meds: . acyclovir  800 mg Oral QHS  . [START ON 09/10/2014] apixaban  5 mg Oral BID   Followed by  . apixaban  10 mg Oral BID  . calcium-vitamin D  1 tablet Oral Q breakfast  . ceFEPime (MAXIPIME) IV  1 g Intravenous Q24H  . cholecalciferol  1,000 Units Oral Daily  . diltiazem  60 mg Oral 4 times per day  . ipratropium  0.25 mg Nebulization 3 times per day  . levalbuterol  0.63 mg Nebulization Q8H  . pantoprazole  40 mg Oral Daily  . pravastatin  20 mg Oral QPM   Continuous Infusions: . sodium chloride 50 mL/hr at 09/04/14 1446   PRN Meds:.acetaminophen **OR** acetaminophen, alum & mag hydroxide-simeth, HYDROmorphone (DILAUDID) injection, ondansetron **OR** ondansetron (ZOFRAN) IV, oxyCODONE    PHYSICAL  EXAM: Vital signs in last 24 hours: Filed Vitals:   09/05/14 0120 09/05/14 0404 09/05/14 0405 09/05/14 0745  BP: 155/77 131/73 131/73 101/50  Pulse: 116 112 114 92  Temp:  98.6 F (37 C)    TempSrc:  Oral    Resp: 23 30 27 21   Height:      Weight:      SpO2: 97% 98% 98% 97%    Weight change:  Filed Weights   09/01/14 2338 09/02/14 1119  Weight: 61.236 kg (135 lb) 61.236 kg (135 lb)   Body mass index is 22.47 kg/(m^2).   Gen Exam: Awake and alert with clear speech.   Neck: Supple, No JVD.   Chest: B/L Clear.   CVS: S1 S2 Regular, no murmurs.  Abdomen: soft, BS +, non tender, non distended.  Extremities: no edema, lower extremities warm to touch. Neurologic: Non Focal.  Skin: No Rash. Wounds: N/A.    Intake/Output from previous day:  Intake/Output Summary (Last 24 hours) at 09/05/14 1043 Last data filed at 09/05/14 1035  Gross per 24 hour  Intake 1562.5 ml  Output    200 ml  Net 1362.5 ml     LAB RESULTS: CBC  Recent Labs Lab 09/01/14 2359 09/03/14 1000 09/04/14 0752 09/05/14 0444  WBC 9.8 6.8 6.5 6.2  HGB 13.4 12.2 11.8* 11.9*  HCT 40.0 37.0 35.1* 35.5*  PLT 187 155 191 196  MCV 100.3* 100.3* 97.8 99.7  MCH 33.6 33.1 32.9 33.4  MCHC 33.5 33.0 33.6 33.5  RDW 13.4 13.6 13.6 13.6  LYMPHSABS 0.5*  --   --   --   MONOABS 0.9  --   --   --   EOSABS 0.2  --   --   --   BASOSABS 0.1  --   --   --     Chemistries   Recent Labs Lab 09/01/14 2359 09/03/14 1000 09/04/14 0752 09/05/14 0444  NA 136 141 140 138  K 3.9 3.6 3.9 3.3*  CL 98 105 106 105  CO2 31 27 26 23   GLUCOSE 127* 93 86 74  BUN 18 7 6 7   CREATININE 1.21* 0.95 0.82 0.81  CALCIUM 9.1 8.4 8.2* 8.3*    CBG: No results for input(s): GLUCAP in the last 168 hours.  GFR Estimated Creatinine Clearance: 44.9 mL/min (by C-G formula based on Cr of 0.81).  Coagulation profile No results for input(s): INR, PROTIME in the last 168 hours.  Cardiac Enzymes No results for input(s): CKMB,  TROPONINI, MYOGLOBIN in the last 168 hours.  Invalid input(s): CK  Invalid input(s): POCBNP No results for input(s): DDIMER in the last 72 hours. No results for input(s): HGBA1C in the last 72 hours. No results for input(s): CHOL, HDL, LDLCALC, TRIG, CHOLHDL, LDLDIRECT in the last 72 hours. No results for input(s): TSH, T4TOTAL, T3FREE, THYROIDAB in the last 72 hours.  Invalid input(s): FREET3 No results for input(s): VITAMINB12, FOLATE, FERRITIN, TIBC, IRON, RETICCTPCT in the last 72 hours.  No results for input(s): LIPASE, AMYLASE in the last 72 hours.  Urine Studies No results for input(s): UHGB, CRYS in the last 72 hours.  Invalid input(s): UACOL, UAPR, USPG, UPH, UTP, UGL, UKET, UBIL, UNIT, UROB, ULEU, UEPI, UWBC, URBC, UBAC, CAST, UCOM, BILUA  MICROBIOLOGY: Recent Results (from the past 240 hour(s))  Blood culture (routine x 2)     Status: None (Preliminary result)   Collection Time: 09/02/14 12:09 AM  Result Value Ref Range Status   Specimen Description BLOOD RIGHT WRIST  Final   Special Requests BOTTLES DRAWN AEROBIC AND ANAEROBIC 3CC EA  Final   Culture   Final           BLOOD CULTURE RECEIVED NO GROWTH TO DATE CULTURE WILL BE HELD FOR 5 DAYS BEFORE ISSUING A FINAL NEGATIVE REPORT Performed at [facility]    Report Status PENDING  Incomplete  Blood culture (routine x 2)     Status: None (Preliminary result)   Collection Time: 09/02/14 12:16 AM  Result Value Ref Range Status   Specimen Description BLOOD LEFT WRIST  Final   Special Requests BOTTLES DRAWN AEROBIC AND ANAEROBIC 3CC EA  Final   Culture   Final           BLOOD CULTURE RECEIVED NO GROWTH TO DATE CULTURE WILL BE HELD FOR 5 DAYS BEFORE ISSUING A FINAL NEGATIVE REPORT Performed at [facility]    Report Status PENDING  Incomplete  Urine culture     Status: None   Collection Time: 09/02/14  1:29 AM  Result Value Ref Range Status   Specimen Description URINE, CLEAN CATCH  Final   Special  Requests NONE  Final   Colony Count NO GROWTH Performed at [facility]   Final   Culture NO GROWTH Performed at [facility]   Final   Report Status 09/03/2014 FINAL  Final  MRSA PCR Screening     Status: None   Collection Time: 09/04/14  8:16 PM  Result Value Ref Range Status   MRSA by PCR NEGATIVE NEGATIVE Final    Comment:        The GeneXpert MRSA Assay (FDA approved for NASAL specimens only), is one component of a comprehensive MRSA colonization surveillance program. It is not intended to diagnose MRSA infection nor to guide or monitor treatment for MRSA infections.     RADIOLOGY STUDIES/RESULTS: Ct Angio Chest Pe W/cm &/or Wo Cm  09/04/2014   CLINICAL DATA:  Acute onset of shortness of breath and hypoxemia. Known DVT. Initial encounter.  EXAM: CT ANGIOGRAPHY CHEST WITH CONTRAST  TECHNIQUE: Multidetector CT imaging of the chest was performed using the standard protocol during bolus administration of intravenous contrast. Multiplanar CT image reconstructions and MIPs were obtained to evaluate the vascular anatomy.  CONTRAST:  27mL OMNIPAQUE IOHEXOL 350 MG/ML SOLN  COMPARISON:  CT of the chest performed 05/17/2014, and chest radiograph performed 09/01/2014  FINDINGS: There is no evidence of pulmonary embolus.  Trace bilateral pleural effusions are noted. Underlying interstitial prominence is seen. Mild peripheral scarring is noted at the left lingula. The focal patchy airspace opacity near the right lung apex is relatively stable, though additional new nodular opacities are seen. This may reflect an atypical infection or possibly mild interstitial edema, though as before, a slow-growing malignancy cannot be excluded.  A spiculated nodule is noted within the right lower lobe (image 51 of 93), measuring 1.0 cm in size. This may be slightly increased in size, but  is difficult to compare with prior studies. Additional smaller pulmonary nodules are seen within the right  lung. There is no evidence of significant focal consolidation, pleural effusion or pneumothorax. No masses are identified; no abnormal focal contrast enhancement is seen.  Mild scattered coronary artery calcification is seen. Prominent precarinal nodes are seen, measuring up to 1.4 cm in short axis. Remaining visualized mediastinal nodes are normal in size, with a calcified subcarinal node seen. No hilar lymphadenopathy is identified. No pericardial effusion is seen.  Scattered calcification is noted along the aortic arch and proximal great vessels. No axillary lymphadenopathy is seen. The visualized portions of the thyroid gland are unremarkable in appearance.  The visualized portions of the liver are unremarkable. Scattered calcified granulomata are seen within the liver. A tiny hiatal hernia is seen. The visualized portions of the pancreas and adrenal glands are unremarkable. Mild scattered calcification is noted along the proximal superior mesenteric artery.  No acute osseous abnormalities are seen. Cervical spinal fusion hardware is partially imaged. Chronic compression deformities are seen at T9, T12 and L2.  Review of the MIP images confirms the above findings.  IMPRESSION: 1. No evidence of pulmonary embolus. 2. Trace bilateral pleural effusions. Underlying interstitial prominence noted. Patchy airspace opacities within the right upper lobe may reflect a mild infectious process or possibly mild pulmonary edema. The underlying focal opacity near the right lung apex could reflect atypical infection, though as before, a slow-growing malignancy cannot be excluded. 3. Spiculated 1.0 cm nodule within the right lower lobe. This may be slightly increased in size, but is difficult to compare with prior studies. As previously suggested, would perform follow-up CT of the chest in 3 months, if tissue sampling is not done before then. 4. Underlying smaller pulmonary nodules noted. 5. Prominent precarinal nodes, measuring  up to 1.4 cm in short axis. These appear increased in size, and would be amenable to biopsy, as deemed clinically appropriate. 6. Tiny hiatal hernia noted. 7. Mild scattered calcification along the proximal superior mesenteric artery. 8. Chronic compression deformities at T9, T12 and L2.   Electronically Signed   By: Garald Balding M.D.   On: 09/04/2014 05:54   Dg Chest Port 1 View  09/02/2014   CLINICAL DATA:  Shortness of breath tonight.  History of COPD.  EXAM: PORTABLE CHEST - 1 VIEW  COMPARISON:  CT chest 05/17/2014.  Chest 07/08/2011.  FINDINGS: Normal heart size and pulmonary vascularity. Hyperinflation suggesting emphysema. Scattered interstitial changes in the lungs probably due to fibrosis. No focal consolidation. No blunting of costophrenic angles. No pneumothorax. Calcified and tortuous aorta. Postoperative changes in the cervical spine. Degenerative changes in the shoulders and spine. Old left rib fractures.  IMPRESSION: Emphysematous changes in the lungs with scattered interstitial fibrosis. No focal consolidation.   Electronically Signed   By: Lucienne Capers M.D.   On: 09/02/2014 00:22    Oren Binet, MD  [facility] Pager:[phone]  If 7PM-7AM, please contact night-coverage [email or web address] Password [facility] 09/05/2014, 10:43 AM   LOS: 3 days

## 2014-09-06 DIAGNOSIS — I471 Supraventricular tachycardia: Secondary | ICD-10-CM

## 2014-09-06 DIAGNOSIS — J9620 Acute and chronic respiratory failure, unspecified whether with hypoxia or hypercapnia: Secondary | ICD-10-CM

## 2014-09-06 LAB — BASIC METABOLIC PANEL
Anion gap: 8 (ref 5–15)
BUN: 9 mg/dL (ref 6–23)
CHLORIDE: 103 mmol/L (ref 96–112)
CO2: 26 mmol/L (ref 19–32)
CREATININE: 0.86 mg/dL (ref 0.50–1.10)
Calcium: 8.4 mg/dL (ref 8.4–10.5)
GFR calc Af Amer: 69 mL/min — ABNORMAL LOW (ref >90)
GFR calc non Af Amer: 59 mL/min — ABNORMAL LOW (ref >90)
Glucose, Bld: 96 mg/dL (ref 70–99)
Potassium: 3.5 mmol/L (ref 3.5–5.1)
Sodium: 137 mmol/L (ref 135–145)

## 2014-09-06 MED ORDER — LEVALBUTEROL HCL 0.63 MG/3ML IN NEBU
0.6300 mg | INHALATION_SOLUTION | Freq: Four times a day (QID) | RESPIRATORY_TRACT | Status: DC | PRN
  Administered 2014-09-07: 0.63 mg via RESPIRATORY_TRACT
  Filled 2014-09-06: qty 3

## 2014-09-06 MED ORDER — ALPRAZOLAM 0.25 MG PO TABS
0.2500 mg | ORAL_TABLET | Freq: Three times a day (TID) | ORAL | Status: DC | PRN
  Administered 2014-09-06 – 2014-09-09 (×6): 0.25 mg via ORAL
  Filled 2014-09-06 (×6): qty 1

## 2014-09-06 MED ORDER — DILTIAZEM HCL 60 MG PO TABS
90.0000 mg | ORAL_TABLET | Freq: Four times a day (QID) | ORAL | Status: DC
  Administered 2014-09-06 – 2014-09-09 (×11): 90 mg via ORAL
  Filled 2014-09-06 (×26): qty 1

## 2014-09-06 NOTE — Progress Notes (Signed)
PATIENT DETAILS Name: Rachel Walters Age: 81 y.o. Sex: female Date of Birth: 11/13/1925 Admit Date: 09/01/2014 Admitting Physician Orson Eva, MD WUX:[id number],[id number] R, MD  Subjective: No major issues-resting heart rate better, but on ambulation still feels tachycardic.  Assessment/Plan: Principal Problem:   Sepsis: Current suspicion is from aspiration pneumonitis. Sepsis pathophysiology has resolved with  empiric antibiotics. Blood cultures from 2/8 negative so far.  Active Problems:  ? Aspiration pneumonitis: Although no evidence of consolidation in x-ray chest and CT chest, given history of esophageal dysmotility and no other foci of infection apparent to explain the fever and sepsis pathophysiology on admission, currently presumed that she may have aspiration pneumonitis. Blood cultures negative, vancomycin discontinued on 2/10, continue with cefepime. If continues to improve we can transition to Augmentin on discharge.    Multifocal atrial tachycardia: Hospital course complicated by development of MAT, seen by cardiology and started on IV Cardizem with good rate control. Now transition to oral Cardizem, continues to have tachycardia now just on ambulation, resting heart rate appropriate. Respiratory issues currently seem to be stable.     Leg DVT (deep venous thromboembolism), acute: Started on  Eliquis.CT angiogram of chest negative.      History of COPD on chronic home O2: Lungs clear this am, continue with bronchodilators. Continue on oxygen.     History of chronic hypoxemic respiratory failure: Secondary to COPD, on home O2.     Essential hypertension: Currently controlled with Cardizem. Lisinopril, amlodipine and metoprolol were discontinued on admission because of hypotension.     History of interstitial lung disease: Continue outpatient follow-up with pulmonary.autoimmune workup negative     Spiculated 1.0 cm nodule within the right lower lobe: Outpatient follow-up  with pulmonology  Disposition: Remain inpatient-SNF in 1-2 days  Antibiotics:  See below  Anti-infectives    Start     Dose/Rate Route Frequency Ordered Stop   09/04/14 0600  vancomycin (VANCOCIN) IVPB 1000 mg/200 mL premix  Status:  Discontinued     1,000 mg 200 mL/hr over 60 Minutes Intravenous Every 48 hours 09/02/14 0319 09/02/14 1431   09/03/14 0400  vancomycin (VANCOCIN) IVPB 750 mg/150 ml premix  Status:  Discontinued     750 mg 150 mL/hr over 60 Minutes Intravenous Every 24 hours 09/02/14 1431 09/04/14 1432   09/02/14 2200  acyclovir (ZOVIRAX) tablet 800 mg     800 mg Oral Daily at bedtime 09/02/14 0459     09/02/14 2200  ceFEPIme (MAXIPIME) 1 g in dextrose 5 % 50 mL IVPB     1 g 100 mL/hr over 30 Minutes Intravenous Every 24 hours 09/02/14 0319     09/02/14 0000  vancomycin (VANCOCIN) IVPB 1000 mg/200 mL premix     1,000 mg 200 mL/hr over 60 Minutes Intravenous  Once 09/01/14 2357 09/02/14 0158   09/02/14 0000  ceFEPIme (MAXIPIME) 1 g in dextrose 5 % 50 mL IVPB     1 g 100 mL/hr over 30 Minutes Intravenous  Once 09/01/14 2357 09/02/14 0059      DVT Prophylaxis: Eliquis  Code Status: Full code  Family Communication None at bedside  Procedures:  None  CONSULTS:  cardiology  MEDICATIONS: Scheduled Meds: . acyclovir  800 mg Oral QHS  . [START ON 09/10/2014] apixaban  5 mg Oral BID   Followed by  . apixaban  10 mg Oral BID  . calcium-vitamin D  1 tablet Oral Q breakfast  . ceFEPime (MAXIPIME) IV  1 g Intravenous Q24H  . cholecalciferol  1,000 Units Oral Daily  . diltiazem  90 mg Oral 4 times per day  . ipratropium  0.25 mg Nebulization TID  . levalbuterol  0.63 mg Nebulization TID  . pantoprazole  40 mg Oral Daily  . pravastatin  20 mg Oral QPM   Continuous Infusions:   PRN Meds:.acetaminophen **OR** acetaminophen, alum & mag hydroxide-simeth, HYDROmorphone (DILAUDID) injection, ondansetron **OR** ondansetron (ZOFRAN) IV, oxyCODONE    PHYSICAL  EXAM: Vital signs in last 24 hours: Filed Vitals:   09/06/14 0000 09/06/14 0400 09/06/14 0740 09/06/14 0848  BP: 141/74 154/75  133/58  Pulse: 106 106  103  Temp: 99.1 F (37.3 C) 98.7 F (37.1 C)  98.6 F (37 C)  TempSrc: Oral Oral  Oral  Resp: 23 29  16   Height:      Weight:      SpO2: 96% 97% 97% 92%    Weight change:  Filed Weights   09/01/14 2338 09/02/14 1119  Weight: 61.236 kg (135 lb) 61.236 kg (135 lb)   Body mass index is 22.47 kg/(m^2).   Gen Exam: Awake and alert with clear speech.   Neck: Supple, No JVD.   Chest: B/L Clear.  No rhonchi. CVS: S1 S2 Regular, no murmurs.  Abdomen: soft, BS +, non tender, non distended.  Extremities: no edema, lower extremities warm to touch. Neurologic: Non Focal.  Skin: No Rash. Wounds: N/A.    Intake/Output from previous day:  Intake/Output Summary (Last 24 hours) at 09/06/14 1159 Last data filed at 09/06/14 0825  Gross per 24 hour  Intake      0 ml  Output   1300 ml  Net  -1300 ml     LAB RESULTS: CBC  Recent Labs Lab 09/01/14 2359 09/03/14 1000 09/04/14 0752 09/05/14 0444  WBC 9.8 6.8 6.5 6.2  HGB 13.4 12.2 11.8* 11.9*  HCT 40.0 37.0 35.1* 35.5*  PLT 187 155 191 196  MCV 100.3* 100.3* 97.8 99.7  MCH 33.6 33.1 32.9 33.4  MCHC 33.5 33.0 33.6 33.5  RDW 13.4 13.6 13.6 13.6  LYMPHSABS 0.5*  --   --   --   MONOABS 0.9  --   --   --   EOSABS 0.2  --   --   --   BASOSABS 0.1  --   --   --     Chemistries   Recent Labs Lab 09/01/14 2359 09/03/14 1000 09/04/14 0752 09/05/14 0444 09/06/14 0519  NA 136 141 140 138 137  K 3.9 3.6 3.9 3.3* 3.5  CL 98 105 106 105 103  CO2 31 27 26 23 26   GLUCOSE 127* 93 86 74 96  BUN 18 7 6 7 9   CREATININE 1.21* 0.95 0.82 0.81 0.86  CALCIUM 9.1 8.4 8.2* 8.3* 8.4    CBG: No results for input(s): GLUCAP in the last 168 hours.  GFR Estimated Creatinine Clearance: 42.3 mL/min (by C-G formula based on Cr of 0.86).  Coagulation profile No results for input(s):  INR, PROTIME in the last 168 hours.  Cardiac Enzymes No results for input(s): CKMB, TROPONINI, MYOGLOBIN in the last 168 hours.  Invalid input(s): CK  Invalid input(s): POCBNP No results for input(s): DDIMER in the last 72 hours. No results for input(s): HGBA1C in the last 72 hours. No results for input(s): CHOL, HDL, LDLCALC, TRIG, CHOLHDL, LDLDIRECT in the last 72 hours. No results for input(s): TSH, T4TOTAL, T3FREE, THYROIDAB in the last 72 hours.  Invalid input(s): FREET3 No results for input(s): VITAMINB12, FOLATE, FERRITIN, TIBC, IRON, RETICCTPCT in the last 72 hours. No results for input(s): LIPASE, AMYLASE in the last 72 hours.  Urine Studies No results for input(s): UHGB, CRYS in the last 72 hours.  Invalid input(s): UACOL, UAPR, USPG, UPH, UTP, UGL, UKET, UBIL, UNIT, UROB, ULEU, UEPI, UWBC, URBC, UBAC, CAST, UCOM, BILUA  MICROBIOLOGY: Recent Results (from the past 240 hour(s))  Blood culture (routine x 2)     Status: None (Preliminary result)   Collection Time: 09/02/14 12:09 AM  Result Value Ref Range Status   Specimen Description BLOOD RIGHT WRIST  Final   Special Requests BOTTLES DRAWN AEROBIC AND ANAEROBIC 3CC EA  Final   Culture   Final           BLOOD CULTURE RECEIVED NO GROWTH TO DATE CULTURE WILL BE HELD FOR 5 DAYS BEFORE ISSUING A FINAL NEGATIVE REPORT Performed at [facility]    Report Status PENDING  Incomplete  Blood culture (routine x 2)     Status: None (Preliminary result)   Collection Time: 09/02/14 12:16 AM  Result Value Ref Range Status   Specimen Description BLOOD LEFT WRIST  Final   Special Requests BOTTLES DRAWN AEROBIC AND ANAEROBIC 3CC EA  Final   Culture   Final           BLOOD CULTURE RECEIVED NO GROWTH TO DATE CULTURE WILL BE HELD FOR 5 DAYS BEFORE ISSUING A FINAL NEGATIVE REPORT Performed at [facility]    Report Status PENDING  Incomplete  Urine culture     Status: None   Collection Time: 09/02/14  1:29 AM  Result  Value Ref Range Status   Specimen Description URINE, CLEAN CATCH  Final   Special Requests NONE  Final   Colony Count NO GROWTH Performed at [facility]   Final   Culture NO GROWTH Performed at [facility]   Final   Report Status 09/03/2014 FINAL  Final  MRSA PCR Screening     Status: None   Collection Time: 09/04/14  8:16 PM  Result Value Ref Range Status   MRSA by PCR NEGATIVE NEGATIVE Final    Comment:        The GeneXpert MRSA Assay (FDA approved for NASAL specimens only), is one component of a comprehensive MRSA colonization surveillance program. It is not intended to diagnose MRSA infection nor to guide or monitor treatment for MRSA infections.     RADIOLOGY STUDIES/RESULTS: Ct Angio Chest Pe W/cm &/or Wo Cm  09/04/2014   CLINICAL DATA:  Acute onset of shortness of breath and hypoxemia. Known DVT. Initial encounter.  EXAM: CT ANGIOGRAPHY CHEST WITH CONTRAST  TECHNIQUE: Multidetector CT imaging of the chest was performed using the standard protocol during bolus administration of intravenous contrast. Multiplanar CT image reconstructions and MIPs were obtained to evaluate the vascular anatomy.  CONTRAST:  12mL OMNIPAQUE IOHEXOL 350 MG/ML SOLN  COMPARISON:  CT of the chest performed 05/17/2014, and chest radiograph performed 09/01/2014  FINDINGS: There is no evidence of pulmonary embolus.  Trace bilateral pleural effusions are noted. Underlying interstitial prominence is seen. Mild peripheral scarring is noted at the left lingula. The focal patchy airspace opacity near the right lung apex is relatively stable, though additional new nodular opacities are seen. This may reflect an atypical infection or possibly mild interstitial edema, though as before, a slow-growing malignancy cannot be excluded.  A spiculated nodule is noted within the right lower  lobe (image 51 of 93), measuring 1.0 cm in size. This may be slightly increased in size, but is difficult to compare  with prior studies. Additional smaller pulmonary nodules are seen within the right lung. There is no evidence of significant focal consolidation, pleural effusion or pneumothorax. No masses are identified; no abnormal focal contrast enhancement is seen.  Mild scattered coronary artery calcification is seen. Prominent precarinal nodes are seen, measuring up to 1.4 cm in short axis. Remaining visualized mediastinal nodes are normal in size, with a calcified subcarinal node seen. No hilar lymphadenopathy is identified. No pericardial effusion is seen.  Scattered calcification is noted along the aortic arch and proximal great vessels. No axillary lymphadenopathy is seen. The visualized portions of the thyroid gland are unremarkable in appearance.  The visualized portions of the liver are unremarkable. Scattered calcified granulomata are seen within the liver. A tiny hiatal hernia is seen. The visualized portions of the pancreas and adrenal glands are unremarkable. Mild scattered calcification is noted along the proximal superior mesenteric artery.  No acute osseous abnormalities are seen. Cervical spinal fusion hardware is partially imaged. Chronic compression deformities are seen at T9, T12 and L2.  Review of the MIP images confirms the above findings.  IMPRESSION: 1. No evidence of pulmonary embolus. 2. Trace bilateral pleural effusions. Underlying interstitial prominence noted. Patchy airspace opacities within the right upper lobe may reflect a mild infectious process or possibly mild pulmonary edema. The underlying focal opacity near the right lung apex could reflect atypical infection, though as before, a slow-growing malignancy cannot be excluded. 3. Spiculated 1.0 cm nodule within the right lower lobe. This may be slightly increased in size, but is difficult to compare with prior studies. As previously suggested, would perform follow-up CT of the chest in 3 months, if tissue sampling is not done before then. 4.  Underlying smaller pulmonary nodules noted. 5. Prominent precarinal nodes, measuring up to 1.4 cm in short axis. These appear increased in size, and would be amenable to biopsy, as deemed clinically appropriate. 6. Tiny hiatal hernia noted. 7. Mild scattered calcification along the proximal superior mesenteric artery. 8. Chronic compression deformities at T9, T12 and L2.   Electronically Signed   By: Garald Balding M.D.   On: 09/04/2014 05:54   Dg Chest Port 1 View  09/02/2014   CLINICAL DATA:  Shortness of breath tonight.  History of COPD.  EXAM: PORTABLE CHEST - 1 VIEW  COMPARISON:  CT chest 05/17/2014.  Chest 07/08/2011.  FINDINGS: Normal heart size and pulmonary vascularity. Hyperinflation suggesting emphysema. Scattered interstitial changes in the lungs probably due to fibrosis. No focal consolidation. No blunting of costophrenic angles. No pneumothorax. Calcified and tortuous aorta. Postoperative changes in the cervical spine. Degenerative changes in the shoulders and spine. Old left rib fractures.  IMPRESSION: Emphysematous changes in the lungs with scattered interstitial fibrosis. No focal consolidation.   Electronically Signed   By: Lucienne Capers M.D.   On: 09/02/2014 00:22    Oren Binet, MD  [facility] Pager:[phone]  If 7PM-7AM, please contact night-coverage [email or web address] Password [email or web address] 09/06/2014, 11:59 AM   LOS: 4 days

## 2014-09-06 NOTE — Progress Notes (Signed)
Patient Profile: 79 female with a history of COPD on home O2, HTN, esophageal dysmotility, neuromuscular disorder, interstitial lung disease. She was admitted with sepsis thought to be a pulmonary source from chronic aspiration. She also has a DVT and was started on Eliquis. CTA negative for PE.Cardiology asked to see for multifocal atrial tachycardia.   Subjective: Ok resting but becomes very symptomatic when she ambulates. She states that her heart started racing when getting up to use the bedside commode. She felt poorly. Feels a bit better now.   Objective: Vital signs in last 24 hours: Temp:  [98.4 F (36.9 C)-99.1 F (37.3 C)] 98.7 F (37.1 C) (02/12 0400) Pulse Rate:  [104-114] 106 (02/12 0400) Resp:  [23-33] 29 (02/12 0400) BP: (131-160)/(65-75) 154/75 mmHg (02/12 0400) SpO2:  [96 %-100 %] 97 % (02/12 0740) Last BM Date: 09/04/14  Intake/Output from previous day: 02/11 0701 - 02/12 0700 In: 125 [P.O.:125] Out: 1200 [Urine:1200] Intake/Output this shift: Total I/O In: -  Out: 300 [Urine:300]  Medications Current Facility-Administered Medications  Medication Dose Route Frequency Provider Last Rate Last Dose  . acetaminophen (TYLENOL) tablet 650 mg  650 mg Oral Q6H PRN Theressa Millard, MD   650 mg at 09/06/14 0541   Or  . acetaminophen (TYLENOL) suppository 650 mg  650 mg Rectal Q6H PRN Theressa Millard, MD      . acyclovir (ZOVIRAX) tablet 800 mg  800 mg Oral QHS Theressa Millard, MD   800 mg at 09/05/14 2257  . alum & mag hydroxide-simeth (MAALOX/MYLANTA) 200-200-20 MG/5ML suspension 30 mL  30 mL Oral Q6H PRN Theressa Millard, MD      . Derrill Memo ON 09/10/2014] apixaban (ELIQUIS) tablet 5 mg  5 mg Oral BID Jake Church Masters, RPH       Followed by  . apixaban Arne Cleveland) tablet 10 mg  10 mg Oral BID Jake Church Masters, RPH   10 mg at 09/05/14 2128  . calcium-vitamin D (OSCAL WITH D) 500-200 MG-UNIT per tablet 1 tablet  1 tablet Oral Q breakfast Theressa Millard,  MD   1 tablet at 09/05/14 0944  . ceFEPIme (MAXIPIME) 1 g in dextrose 5 % 50 mL IVPB  1 g Intravenous Q24H Theressa Millard, MD   1 g at 09/05/14 2130  . cholecalciferol (VITAMIN D) tablet 1,000 Units  1,000 Units Oral Daily Theressa Millard, MD   1,000 Units at 09/05/14 0945  . diltiazem (CARDIZEM) tablet 60 mg  60 mg Oral 4 times per day Isaiah Serge, NP   60 mg at 09/06/14 0541  . HYDROmorphone (DILAUDID) injection 0.5-1 mg  0.5-1 mg Intravenous Q3H PRN Theressa Millard, MD      . ipratropium (ATROVENT) nebulizer solution 0.25 mg  0.25 mg Nebulization TID Jonetta Osgood, MD   0.25 mg at 09/06/14 0738  . levalbuterol (XOPENEX) nebulizer solution 0.63 mg  0.63 mg Nebulization TID Jonetta Osgood, MD   0.63 mg at 09/06/14 0737  . ondansetron (ZOFRAN) tablet 4 mg  4 mg Oral Q6H PRN Theressa Millard, MD       Or  . ondansetron (ZOFRAN) injection 4 mg  4 mg Intravenous Q6H PRN Theressa Millard, MD      . oxyCODONE (Oxy IR/ROXICODONE) immediate release tablet 5 mg  5 mg Oral Q4H PRN Theressa Millard, MD   5 mg at 09/05/14 2257  . pantoprazole (PROTONIX) EC tablet 40 mg  40 mg Oral Daily Harvette  Evonnie Dawes, MD   40 mg at 09/05/14 0944  . pravastatin (PRAVACHOL) tablet 20 mg  20 mg Oral QPM Theressa Millard, MD   20 mg at 09/05/14 1718    PE: General appearance: alert, cooperative and no distress Neck: no carotid bruit and no JVD Lungs: bibasilar crackles, no wheezes or rhonchi Heart: regular ryhthm. tachy rate Extremities: no edema Pulses: 2+ and symmetric Skin: warm and dry Neurologic: Grossly normal  Lab Results:   Recent Labs  09/03/14 1000 09/04/14 0752 09/05/14 0444  WBC 6.8 6.5 6.2  HGB 12.2 11.8* 11.9*  HCT 37.0 35.1* 35.5*  PLT 155 191 196   BMET  Recent Labs  09/04/14 0752 09/05/14 0444 09/06/14 0519  NA 140 138 137  K 3.9 3.3* 3.5  CL 106 105 103  CO2 26 23 26   GLUCOSE 86 74 96  BUN 6 7 9   CREATININE 0.82 0.81 0.86  CALCIUM 8.2* 8.3* 8.4     Studies/Results: 2D echo 09/05/14 Study Conclusions  - Left ventricle: Small underfilled hyperdynamic LV The cavity size was normal. Wall thickness was increased in a pattern of mild LVH. Systolic function was vigorous. The estimated ejection fraction was in the range of 65% to 70%. Wall motion was normal; there were no regional wall motion abnormalities.   Assessment/Plan  Principal Problem:   Sepsis Active Problems:   Essential hypertension   COPD (chronic obstructive pulmonary disease)   Chronic hypoxemic respiratory failure   Hypotension   SIRS (systemic inflammatory response syndrome)   Acute on chronic respiratory failure   Leg DVT (deep venous thromboembolism), acute   Multifocal atrial tachycardia   1. Multifocal Atrial Tachycardia: felt 2/2 pulmonary and medical issues. Continue to treat underlying cause. 2D echo revealed mild LVH and vigorous LVF with EF of 65-70%. No WMA. Right and left atria are normal in size. Continue PO Cardizem to help with rate control. She is currently on 60 mg Q6H. Resting rate ~105 but increases when she ambulates and she becomes very symptomatic. She is not due for another dose until 11:40.  There is room with BP to increase Cardizem further.  Agree with Xopenex vs albuterol.     LOS: 4 days    Brittainy M. Ladoris Gene 09/06/2014 8:38 AM  Patient seen and examined and history reviewed. Agree with above findings and plan. She still has symptomatic tachycardia with ambulation. Telemetry still shows MAT. We can increase Cardizem po but I don't anticipate improvement in her rhythm until her underlying pulmonary issues improve.   Peter Martinique, [place] 09/06/2014 11:28 AM

## 2014-09-06 NOTE — Clinical Social Work Placement (Addendum)
    Clinical Social Work Department CLINICAL SOCIAL WORK PLACEMENT NOTE 09/06/2014  Patient:  Rachel Walters, Rachel Walters  Account Number:  [id number] Admit date:  09/01/2014  Clinical Social Worker:  Berton Mount, Latanya Presser  Date/time:  09/06/2014 11:29 AM  Clinical Social Work is seeking post-discharge placement for this patient at the following level of care:   SKILLED NURSING   (*CSW will update this form in Epic as items are completed)   09/06/2014  Patient/family provided with [facility] Department of Clinical Social Work's list of facilities offering this level of care within the geographic area requested by the patient (or if unable, by the patient's family).  09/06/2014  Patient/family informed of their freedom to choose among providers that offer the needed level of care, that participate in Medicare, Medicaid or managed care program needed by the patient, have an available bed and are willing to accept the patient.  09/06/2014  Patient/family informed of [facility]' ownership interest in [facility], as well as of the fact that they are under no obligation to receive care at this facility.  PASARR submitted to EDS on Existing PASARR number received on   FL2 transmitted to all facilities in geographic area requested by pt/family on  09/06/2014 FL2 transmitted to all facilities within larger geographic area on   Patient informed that his/her managed care company has contracts with or will negotiate with  certain facilities, including the following:     Patient/family informed of bed offers received:  09/06/2014 Patient chooses bed at [facility] Physician recommends and patient chooses bed at    Patient to be transferred to [facility]  on  09/09/2014 Patient to be transferred to facility by [facility] Patient and family notified of transfer on 09/09/2014 Name of family member notified: Pt notified by declined for CSW to call family/friend  The following physician request were  entered in Epic: Physician Request  Please sign FL2.    Additional CommentsBerton Mount, [place]

## 2014-09-06 NOTE — Progress Notes (Signed)
CSW (Clinical Education officer, museum) provided pt with bed offers. Pt would like to go to [place]. CSW did prepare pt to consider a second choice facility incase [facility] cannot accept weekend discharge. Pt agreeable to this. CSW notified [facility] of bed acceptance. Facility requesting dc summary by 11am. CSW will leave information for weekend CSW.  [place], [place]

## 2014-09-06 NOTE — Progress Notes (Signed)
Physical Therapy Treatment Patient Details Name: Rachel Walters MRN: [id number] DOB: 1925/12/05 Today's Date: 09/06/2014    History of Present Illness Rachel Walters is a 81 y.o. female with a history of O2 Dependent COPD and ILD, HTN, and Esophageal Dysmotility who presents to the ED with complaints of worsening SOB and Productive Cough over the past 2-3 days.  She began to have fevers over the past 24 hours, and reports that she has a chronic cough which has been gray in color but has changed to yellow over thepast 2 days.   She was evaluated in the ED and her Chest X-Ray revealed COPD changes but no infiltrates, however her vitals revealed hypotension and tachycardia and hypoxemia.    A sepsis workup was initiated and she was placed on IV Vancomycin and Cefepime and referred for admission.        PT Comments    Pt admitted with above diagnosis. Pt currently with functional limitations due to endurance deficits as well as tachycardia with activity.  Pt will need SNF at d/c to reach maximal functional level.  Pt will benefit from skilled PT to increase their independence and safety with mobility to allow discharge to the venue listed below.    Follow Up Recommendations  SNF;Supervision/Assistance - 24 hour     Equipment Recommendations  Other (comment) (TBA)    Recommendations for Other Services       Precautions / Restrictions Precautions Precautions: Fall Precaution Comments: monitor HR Restrictions Weight Bearing Restrictions: No    Mobility  Bed Mobility Overal bed mobility: Independent                Transfers Overall transfer level: Needs assistance Equipment used: Rolling walker (2 wheeled) Transfers: Sit to/from Stand Sit to Stand: Min guard         General transfer comment: safe transfer technique but needed min guard assist for stability.   Ambulation/Gait Ambulation/Gait assistance: Min guard Ambulation Distance (Feet): 35 Feet Assistive device:  Rolling walker (2 wheeled) Gait Pattern/deviations: Step-through pattern;Decreased step length - right;Decreased step length - left;Shuffle   Gait velocity interpretation: Below normal speed for age/gender General Gait Details: slow and guarded gait, Further ambulation limited due to tachycardia up to 150 bpm  with sats on 3L [place] at 90%-93%.  Pt did neede to use 3N1 and was able to clean herself when finished.    Stairs            Wheelchair Mobility    Modified Rankin (Stroke Patients Only)       Balance Overall balance assessment: Needs assistance Sitting-balance support: No upper extremity supported;Feet supported Sitting balance-Leahy Scale: Good     Standing balance support: Bilateral upper extremity supported;During functional activity Standing balance-Leahy Scale: Fair Standing balance comment: can stand statically to clean bottom without UE support.  Needs UE support when challenged.                      Cognition Arousal/Alertness: Awake/alert Behavior During Therapy: WFL for tasks assessed/performed Overall Cognitive Status: Within Functional Limits for tasks assessed                      Exercises      General Comments General comments (skin integrity, edema, etc.): Called nursing about rash on pts back.  Nurse came and put powder on pt.        Pertinent Vitals/Pain Pain Assessment: No/denies pain  HR 120-150 bpm with  activity.  Sats 90-93% on 3LO2.      Home Living                      Prior Function            PT Goals (current goals can now be found in the care plan section) Progress towards PT goals: Progressing toward goals (linited by Incr HR with activity)    Frequency  Min 3X/week    PT Plan Discharge plan needs to be updated    Co-evaluation             End of Session Equipment Utilized During Treatment: Gait belt;Oxygen Activity Tolerance: Patient limited by fatigue (limited by tachycardia) Patient  left: in chair;with call bell/phone within reach     Time: 1237-1303 PT Time Calculation (min) (ACUTE ONLY): 26 min  Charges:  $Gait Training: 8-22 mins $Self Care/Home Management: 8-22                    G Codes:      Kurtiss Wence F 09/17/14, 1:51 PM  [facility] [phone] (pager)

## 2014-09-06 NOTE — Clinical Social Work Psychosocial (Signed)
     Clinical Social Work Department BRIEF PSYCHOSOCIAL ASSESSMENT 09/06/2014  Patient:  Rachel Walters, Rachel Walters     Account Number:  [id number]     Admit date:  09/01/2014  Clinical Social Worker:  Adair Laundry  Date/Time:  09/06/2014 11:23 AM  Referred by:  RN  Date Referred:  09/06/2014 Referred for  SNF Placement   Other Referral:   Interview type:  Patient Other interview type:    PSYCHOSOCIAL DATA Living Status:  ALONE Admitted from facility:   Level of care:   Primary support name:  Elayne Guerin Primary support relationship to patient:  CHILD, ADULT Degree of support available:   Pt has good support from her children    CURRENT CONCERNS Current Concerns  Post-Acute Placement   Other Concerns:    SOCIAL WORK ASSESSMENT / PLAN CSW notified by pt nurse that pt is requesting to dc to SNF for ST rehab. CSW visited pt room and discussed with pt. Pt informed CSW that she lives alone and has been to rehab before. Pt feels as though her past experience was very beneficial and she could use the same rehab right now. Pt informed CSW she does have children who check in on her frequently. Pt daughter actually called twice during Angelica visit with pt. Pt informed CSW she went to [facility] in the past but is not sure if she wants to return. CSW explained SNF referral process and pt is agreeable to referral being sent to all [facility] and [facility] SNFs.  Pt discussed SOB and "raising heart" issue with CSW. Pt informed CSW that she has felt these symptoms before but not to the degree that brought her in this admission. Pt unsure of plan for hospitalization and expressed she has not been informed of a potential dc date. Pt understanding of her condition is limited but good.  CSW did note that pt has refused PT for past two days. CSW did advise pt that if pt wishes to dc to SNF she will need to work with PT today so updated note is available for facilities.   Assessment/plan  status:  Psychosocial Support/Ongoing Assessment of Needs Other assessment/ plan:   Information/referral to community resources:   SNF list to be provided with bed offers    PATIENTS/FAMILYS RESPONSE TO PLAN OF CARE: Pt pleasant and cooperative. Pt in good spirits and requesting SNF to maximize safety/mobility before returning home.      [place]

## 2014-09-07 DIAGNOSIS — I10 Essential (primary) hypertension: Secondary | ICD-10-CM

## 2014-09-07 DIAGNOSIS — A408 Other streptococcal sepsis: Secondary | ICD-10-CM

## 2014-09-07 MED ORDER — NEBIVOLOL HCL 5 MG PO TABS
5.0000 mg | ORAL_TABLET | Freq: Every day | ORAL | Status: DC
  Administered 2014-09-07 – 2014-09-09 (×3): 5 mg via ORAL
  Filled 2014-09-07 (×3): qty 1

## 2014-09-07 NOTE — Progress Notes (Signed)
Patient ID: Rachel Walters, female   DOB: 1926/02/06, 81 y.o.   MRN: [id number]  Patient Profile: 42 female with a history of COPD on home O2, HTN, esophageal dysmotility, neuromuscular disorder, interstitial lung disease. She was admitted with sepsis thought to be a pulmonary source from chronic aspiration. She also has a DVT and was started on Eliquis. CTA negative for PE.Cardiology asked to see for multifocal atrial tachycardia.   Subjective: Does not feel she can go to rehab  Still dyspnic wants to see our lung doctors  Sees Carolynn Serve NP at [place] [facility]   Objective: Vital signs in last 24 hours: Temp:  [97.8 F (36.6 C)-98.6 F (37 C)] 97.8 F (36.6 C) (02/13 0000) Pulse Rate:  [103-119] 116 (02/13 0400) Resp:  [16-36] 36 (02/13 0400) BP: (126-160)/(58-78) 126/61 mmHg (02/13 0400) SpO2:  [91 %-99 %] 96 % (02/13 0547) Weight:  [59.467 kg (131 lb 1.6 oz)] 59.467 kg (131 lb 1.6 oz) (02/13 0400) Last BM Date: 09/05/14  Intake/Output from previous day: 02/12 0701 - 02/13 0700 In: 770 [P.O.:720; IV Piggyback:50] Out: 1975 [Urine:1975] Intake/Output this shift:    Medications Current Facility-Administered Medications  Medication Dose Route Frequency Provider Last Rate Last Dose  . acetaminophen (TYLENOL) tablet 650 mg  650 mg Oral Q6H PRN Theressa Millard, MD   650 mg at 09/07/14 2409   Or  . acetaminophen (TYLENOL) suppository 650 mg  650 mg Rectal Q6H PRN Theressa Millard, MD      . acyclovir (ZOVIRAX) tablet 800 mg  800 mg Oral QHS Theressa Millard, MD   800 mg at 09/06/14 2037  . ALPRAZolam Duanne Moron) tablet 0.25 mg  0.25 mg Oral TID PRN Jonetta Osgood, MD   0.25 mg at 09/06/14 2037  . alum & mag hydroxide-simeth (MAALOX/MYLANTA) 200-200-20 MG/5ML suspension 30 mL  30 mL Oral Q6H PRN Theressa Millard, MD      . Derrill Memo ON 09/10/2014] apixaban (ELIQUIS) tablet 5 mg  5 mg Oral BID Jake Church Masters, RPH       Followed by  . apixaban Arne Cleveland) tablet 10 mg  10 mg  Oral BID Jake Church Masters, RPH   10 mg at 09/06/14 2036  . calcium-vitamin D (OSCAL WITH D) 500-200 MG-UNIT per tablet 1 tablet  1 tablet Oral Q breakfast Theressa Millard, MD   1 tablet at 09/07/14 0804  . ceFEPIme (MAXIPIME) 1 g in dextrose 5 % 50 mL IVPB  1 g Intravenous Q24H Theressa Millard, MD   1 g at 09/06/14 2038  . cholecalciferol (VITAMIN D) tablet 1,000 Units  1,000 Units Oral Daily Theressa Millard, MD   1,000 Units at 09/06/14 0940  . diltiazem (CARDIZEM) tablet 90 mg  90 mg Oral 4 times per day Ivann Trimarco M [place], MD   90 mg at 09/07/14 0529  . HYDROmorphone (DILAUDID) injection 0.5-1 mg  0.5-1 mg Intravenous Q3H PRN Theressa Millard, MD      . ipratropium (ATROVENT) nebulizer solution 0.25 mg  0.25 mg Nebulization TID Jonetta Osgood, MD   0.25 mg at 09/06/14 2055  . levalbuterol (XOPENEX) nebulizer solution 0.63 mg  0.63 mg Nebulization TID Jonetta Osgood, MD   0.63 mg at 09/06/14 2055  . levalbuterol (XOPENEX) nebulizer solution 0.63 mg  0.63 mg Nebulization Q6H PRN Ritta Slot, NP   0.63 mg at 09/07/14 0547  . ondansetron (ZOFRAN) tablet 4 mg  4 mg Oral Q6H PRN Harvette C  Arnoldo Morale, MD       Or  . ondansetron [facility]) injection 4 mg  4 mg Intravenous Q6H PRN Theressa Millard, MD      . oxyCODONE (Oxy IR/ROXICODONE) immediate release tablet 5 mg  5 mg Oral Q4H PRN Theressa Millard, MD   5 mg at 09/05/14 2257  . pantoprazole (PROTONIX) EC tablet 40 mg  40 mg Oral Daily Theressa Millard, MD   40 mg at 09/06/14 0940  . pravastatin (PRAVACHOL) tablet 20 mg  20 mg Oral QPM Theressa Millard, MD   20 mg at 09/06/14 1730    PE: General appearance: alert, cooperative and no distress Neck: no carotid bruit and no JVD Lungs: bibasilar crackles, no wheezes or rhonchi Heart: regular ryhthm. tachy rate Extremities: no edema Pulses: 2+ and symmetric Skin: warm and dry Neurologic: Grossly normal  Lab Results:   Recent Labs  09/05/14 0444  WBC 6.2  HGB 11.9*  HCT  35.5*  PLT 196   BMET  Recent Labs  09/05/14 0444 09/06/14 0519  NA 138 137  K 3.3* 3.5  CL 105 103  CO2 23 26  GLUCOSE 74 96  BUN 7 9  CREATININE 0.81 0.86  CALCIUM 8.3* 8.4    Studies/Results: 2D echo 09/05/14 Study Conclusions  - Left ventricle: Small underfilled hyperdynamic LV The cavity size was normal. Wall thickness was increased in a pattern of mild LVH. Systolic function was vigorous. The estimated ejection fraction was in the range of 65% to 70%. Wall motion was normal; there were no regional wall motion abnormalities.   Assessment/Plan  Principal Problem:   Sepsis Active Problems:   Essential hypertension   COPD (chronic obstructive pulmonary disease)   Chronic hypoxemic respiratory failure   Hypotension   SIRS (systemic inflammatory response syndrome)   Acute on chronic respiratory failure   Leg DVT (deep venous thromboembolism), acute   Multifocal atrial tachycardia   1. Multifocal Atrial Tachycardia: related to pulmonary issues  Echo with normal EF  xopenex for inhaler  Continue cardizem  Will try low dose sympathomimetic beta blocker since cardizem not very  Effective.  Would have [facility] see patient in hospital  Observ 48 hrs while starting beta blocker     Jenkins Rouge, [place] 09/07/2014 8:21 AM

## 2014-09-07 NOTE — Progress Notes (Addendum)
PATIENT DETAILS Name: Rachel Walters Age: 81 y.o. Sex: female Date of Birth: 1926-03-11 Admit Date: 09/01/2014 Admitting Physician Orson Eva, MD PQZ:RAQT,[id number] R, MD  Subjective: No major issues-resting heart rate better, but on ambulation still feels tachycardic.  Assessment/Plan: Principal Problem:   Sepsis: Current suspicion is from aspiration pneumonitis. Sepsis pathophysiology has resolved with  empiric antibiotics. Blood cultures from 2/8 negative so far.  Active Problems:  ? Aspiration pneumonitis: Although no evidence of consolidation in x-ray chest and CT chest, given history of esophageal dysmotility and no other foci of infection apparent to explain the fever and sepsis pathophysiology on admission, currently presumed that she may have aspiration pneumonitis. Blood cultures negative, vancomycin discontinued on 2/10, continue with cefepime with stop date 2/14.    Multifocal atrial tachycardia: Hospital course complicated by development of MAT, seen by cardiology and initially started on IV Cardizem, continues to have tachycardia mostly with ambulation. Started on beta blockers. Cardiology continues to follow. Monitor in telemetry.      Leg DVT (deep venous thromboembolism), acute: Started on  Eliquis.CT angiogram of chest negative.      History of COPD on chronic home O2: Lungs clear this am, continue with bronchodilators. Continue on oxygen.     History of chronic hypoxemic respiratory failure: Secondary to COPD, on home O2.     Essential hypertension: Currently controlled with Cardizem. Lisinopril, amlodipine and metoprolol were discontinued on admission because of hypotension.     History of interstitial lung disease: Continue outpatient follow-up with pulmonary.autoimmune workup negative . I have consulted pulmonary critical care, Dr. Lamonte Sakai to see tomorrow morning.    Spiculated 1.0 cm nodule within the right lower lobe: Outpatient follow-up with  pulmonology  Disposition: Remain inpatient-SNF in 1-2 days  Antibiotics:  See below  Anti-infectives    Start     Dose/Rate Route Frequency Ordered Stop   09/04/14 0600  vancomycin (VANCOCIN) IVPB 1000 mg/200 mL premix  Status:  Discontinued     1,000 mg 200 mL/hr over 60 Minutes Intravenous Every 48 hours 09/02/14 0319 09/02/14 1431   09/03/14 0400  vancomycin (VANCOCIN) IVPB 750 mg/150 ml premix  Status:  Discontinued     750 mg 150 mL/hr over 60 Minutes Intravenous Every 24 hours 09/02/14 1431 09/04/14 1432   09/02/14 2200  acyclovir (ZOVIRAX) tablet 800 mg     800 mg Oral Daily at bedtime 09/02/14 0459     09/02/14 2200  ceFEPIme (MAXIPIME) 1 g in dextrose 5 % 50 mL IVPB     1 g 100 mL/hr over 30 Minutes Intravenous Every 24 hours 09/02/14 0319     09/02/14 0000  vancomycin (VANCOCIN) IVPB 1000 mg/200 mL premix     1,000 mg 200 mL/hr over 60 Minutes Intravenous  Once 09/01/14 2357 09/02/14 0158   09/02/14 0000  ceFEPIme (MAXIPIME) 1 g in dextrose 5 % 50 mL IVPB     1 g 100 mL/hr over 30 Minutes Intravenous  Once 09/01/14 2357 09/02/14 0059      DVT Prophylaxis: Eliquis  Code Status: Full code  Family Communication Son and daughter in law at bedside  Procedures:  None  CONSULTS:  cardiology   PCCM  MEDICATIONS: Scheduled Meds: . acyclovir  800 mg Oral QHS  . [START ON 09/10/2014] apixaban  5 mg Oral BID   Followed by  . apixaban  10 mg Oral BID  . calcium-vitamin D  1 tablet Oral Q breakfast  .  ceFEPime (MAXIPIME) IV  1 g Intravenous Q24H  . cholecalciferol  1,000 Units Oral Daily  . diltiazem  90 mg Oral 4 times per day  . ipratropium  0.25 mg Nebulization TID  . levalbuterol  0.63 mg Nebulization TID  . nebivolol  5 mg Oral Daily  . pantoprazole  40 mg Oral Daily  . pravastatin  20 mg Oral QPM   Continuous Infusions:   PRN Meds:.acetaminophen **OR** acetaminophen, ALPRAZolam, alum & mag hydroxide-simeth, HYDROmorphone (DILAUDID) injection,  levalbuterol, ondansetron **OR** ondansetron (ZOFRAN) IV, oxyCODONE    PHYSICAL EXAM: Vital signs in last 24 hours: Filed Vitals:   09/07/14 0400 09/07/14 0547 09/07/14 0838 09/07/14 0859  BP: 126/61     Pulse: 116     Temp:    98.1 F (36.7 C)  TempSrc: Oral   Oral  Resp: 36     Height: 5\' 5"  (1.651 m)     Weight: 59.467 kg (131 lb 1.6 oz)     SpO2: 95% 96% 92%     Weight change:  Filed Weights   09/01/14 2338 09/02/14 1119 09/07/14 0400  Weight: 61.236 kg (135 lb) 61.236 kg (135 lb) 59.467 kg (131 lb 1.6 oz)   Body mass index is 21.82 kg/(m^2).   Gen Exam: Awake and alert with clear speech.   Neck: Supple, No JVD.   Chest: B/L Clear.  No rhonchi. CVS: S1 S2 Regular, no murmurs.  Abdomen: soft, BS +, non tender, non distended.  Extremities: no edema, lower extremities warm to touch. Neurologic: Non Focal.  Skin: No Rash. Wounds: N/A.    Intake/Output from previous day:  Intake/Output Summary (Last 24 hours) at 09/07/14 1211 Last data filed at 09/07/14 0534  Gross per 24 hour  Intake    770 ml  Output   1675 ml  Net   -905 ml     LAB RESULTS: CBC  Recent Labs Lab 09/01/14 2359 09/03/14 1000 09/04/14 0752 09/05/14 0444  WBC 9.8 6.8 6.5 6.2  HGB 13.4 12.2 11.8* 11.9*  HCT 40.0 37.0 35.1* 35.5*  PLT 187 155 191 196  MCV 100.3* 100.3* 97.8 99.7  MCH 33.6 33.1 32.9 33.4  MCHC 33.5 33.0 33.6 33.5  RDW 13.4 13.6 13.6 13.6  LYMPHSABS 0.5*  --   --   --   MONOABS 0.9  --   --   --   EOSABS 0.2  --   --   --   BASOSABS 0.1  --   --   --     Chemistries   Recent Labs Lab 09/01/14 2359 09/03/14 1000 09/04/14 0752 09/05/14 0444 09/06/14 0519  NA 136 141 140 138 137  K 3.9 3.6 3.9 3.3* 3.5  CL 98 105 106 105 103  CO2 31 27 26 23 26   GLUCOSE 127* 93 86 74 96  BUN 18 7 6 7 9   CREATININE 1.21* 0.95 0.82 0.81 0.86  CALCIUM 9.1 8.4 8.2* 8.3* 8.4    CBG: No results for input(s): GLUCAP in the last 168 hours.  GFR Estimated Creatinine Clearance:  42.3 mL/min (by C-G formula based on Cr of 0.86).  Coagulation profile No results for input(s): INR, PROTIME in the last 168 hours.  Cardiac Enzymes No results for input(s): CKMB, TROPONINI, MYOGLOBIN in the last 168 hours.  Invalid input(s): CK  Invalid input(s): POCBNP No results for input(s): DDIMER in the last 72 hours. No results for input(s): HGBA1C in the last 72 hours. No results for input(s): CHOL,  HDL, LDLCALC, TRIG, CHOLHDL, LDLDIRECT in the last 72 hours. No results for input(s): TSH, T4TOTAL, T3FREE, THYROIDAB in the last 72 hours.  Invalid input(s): FREET3 No results for input(s): VITAMINB12, FOLATE, FERRITIN, TIBC, IRON, RETICCTPCT in the last 72 hours. No results for input(s): LIPASE, AMYLASE in the last 72 hours.  Urine Studies No results for input(s): UHGB, CRYS in the last 72 hours.  Invalid input(s): UACOL, UAPR, USPG, UPH, UTP, UGL, UKET, UBIL, UNIT, UROB, ULEU, UEPI, UWBC, URBC, UBAC, CAST, UCOM, BILUA  MICROBIOLOGY: Recent Results (from the past 240 hour(s))  Blood culture (routine x 2)     Status: None (Preliminary result)   Collection Time: 09/02/14 12:09 AM  Result Value Ref Range Status   Specimen Description BLOOD RIGHT WRIST  Final   Special Requests BOTTLES DRAWN AEROBIC AND ANAEROBIC 3CC EA  Final   Culture   Final           BLOOD CULTURE RECEIVED NO GROWTH TO DATE CULTURE WILL BE HELD FOR 5 DAYS BEFORE ISSUING A FINAL NEGATIVE REPORT Performed at [facility]    Report Status PENDING  Incomplete  Blood culture (routine x 2)     Status: None (Preliminary result)   Collection Time: 09/02/14 12:16 AM  Result Value Ref Range Status   Specimen Description BLOOD LEFT WRIST  Final   Special Requests BOTTLES DRAWN AEROBIC AND ANAEROBIC 3CC EA  Final   Culture   Final           BLOOD CULTURE RECEIVED NO GROWTH TO DATE CULTURE WILL BE HELD FOR 5 DAYS BEFORE ISSUING A FINAL NEGATIVE REPORT Performed at [facility]    Report Status  PENDING  Incomplete  Urine culture     Status: None   Collection Time: 09/02/14  1:29 AM  Result Value Ref Range Status   Specimen Description URINE, CLEAN CATCH  Final   Special Requests NONE  Final   Colony Count NO GROWTH Performed at [facility]   Final   Culture NO GROWTH Performed at [facility]   Final   Report Status 09/03/2014 FINAL  Final  MRSA PCR Screening     Status: None   Collection Time: 09/04/14  8:16 PM  Result Value Ref Range Status   MRSA by PCR NEGATIVE NEGATIVE Final    Comment:        The GeneXpert MRSA Assay (FDA approved for NASAL specimens only), is one component of a comprehensive MRSA colonization surveillance program. It is not intended to diagnose MRSA infection nor to guide or monitor treatment for MRSA infections.     RADIOLOGY STUDIES/RESULTS: Ct Angio Chest Pe W/cm &/or Wo Cm  09/04/2014   CLINICAL DATA:  Acute onset of shortness of breath and hypoxemia. Known DVT. Initial encounter.  EXAM: CT ANGIOGRAPHY CHEST WITH CONTRAST  TECHNIQUE: Multidetector CT imaging of the chest was performed using the standard protocol during bolus administration of intravenous contrast. Multiplanar CT image reconstructions and MIPs were obtained to evaluate the vascular anatomy.  CONTRAST:  61mL OMNIPAQUE IOHEXOL 350 MG/ML SOLN  COMPARISON:  CT of the chest performed 05/17/2014, and chest radiograph performed 09/01/2014  FINDINGS: There is no evidence of pulmonary embolus.  Trace bilateral pleural effusions are noted. Underlying interstitial prominence is seen. Mild peripheral scarring is noted at the left lingula. The focal patchy airspace opacity near the right lung apex is relatively stable, though additional new nodular opacities are seen. This may reflect an atypical infection  or possibly mild interstitial edema, though as before, a slow-growing malignancy cannot be excluded.  A spiculated nodule is noted within the right lower lobe (image 51 of  93), measuring 1.0 cm in size. This may be slightly increased in size, but is difficult to compare with prior studies. Additional smaller pulmonary nodules are seen within the right lung. There is no evidence of significant focal consolidation, pleural effusion or pneumothorax. No masses are identified; no abnormal focal contrast enhancement is seen.  Mild scattered coronary artery calcification is seen. Prominent precarinal nodes are seen, measuring up to 1.4 cm in short axis. Remaining visualized mediastinal nodes are normal in size, with a calcified subcarinal node seen. No hilar lymphadenopathy is identified. No pericardial effusion is seen.  Scattered calcification is noted along the aortic arch and proximal great vessels. No axillary lymphadenopathy is seen. The visualized portions of the thyroid gland are unremarkable in appearance.  The visualized portions of the liver are unremarkable. Scattered calcified granulomata are seen within the liver. A tiny hiatal hernia is seen. The visualized portions of the pancreas and adrenal glands are unremarkable. Mild scattered calcification is noted along the proximal superior mesenteric artery.  No acute osseous abnormalities are seen. Cervical spinal fusion hardware is partially imaged. Chronic compression deformities are seen at T9, T12 and L2.  Review of the MIP images confirms the above findings.  IMPRESSION: 1. No evidence of pulmonary embolus. 2. Trace bilateral pleural effusions. Underlying interstitial prominence noted. Patchy airspace opacities within the right upper lobe may reflect a mild infectious process or possibly mild pulmonary edema. The underlying focal opacity near the right lung apex could reflect atypical infection, though as before, a slow-growing malignancy cannot be excluded. 3. Spiculated 1.0 cm nodule within the right lower lobe. This may be slightly increased in size, but is difficult to compare with prior studies. As previously suggested,  would perform follow-up CT of the chest in 3 months, if tissue sampling is not done before then. 4. Underlying smaller pulmonary nodules noted. 5. Prominent precarinal nodes, measuring up to 1.4 cm in short axis. These appear increased in size, and would be amenable to biopsy, as deemed clinically appropriate. 6. Tiny hiatal hernia noted. 7. Mild scattered calcification along the proximal superior mesenteric artery. 8. Chronic compression deformities at T9, T12 and L2.   Electronically Signed   By: Garald Balding M.D.   On: 09/04/2014 05:54   Dg Chest Port 1 View  09/02/2014   CLINICAL DATA:  Shortness of breath tonight.  History of COPD.  EXAM: PORTABLE CHEST - 1 VIEW  COMPARISON:  CT chest 05/17/2014.  Chest 07/08/2011.  FINDINGS: Normal heart size and pulmonary vascularity. Hyperinflation suggesting emphysema. Scattered interstitial changes in the lungs probably due to fibrosis. No focal consolidation. No blunting of costophrenic angles. No pneumothorax. Calcified and tortuous aorta. Postoperative changes in the cervical spine. Degenerative changes in the shoulders and spine. Old left rib fractures.  IMPRESSION: Emphysematous changes in the lungs with scattered interstitial fibrosis. No focal consolidation.   Electronically Signed   By: Lucienne Capers M.D.   On: 09/02/2014 00:22    Oren Binet, MD  [facility] Pager:[phone]  If 7PM-7AM, please contact night-coverage [email or web address] Password [email or web address] 09/07/2014, 12:11 PM   LOS: 5 days

## 2014-09-07 NOTE — Progress Notes (Signed)
Pt family came in ti see pt. Requesting to speak with MD.

## 2014-09-08 DIAGNOSIS — J449 Chronic obstructive pulmonary disease, unspecified: Secondary | ICD-10-CM

## 2014-09-08 LAB — CULTURE, BLOOD (ROUTINE X 2)
CULTURE: NO GROWTH
Culture: NO GROWTH

## 2014-09-08 NOTE — Progress Notes (Signed)
Late Entry - Pt's 0600 diltiazem 90mg  administered at 0515, computer in pt room locked up while scanning.  Unable to document.  Computer states order is locked.  Report given to Rey, Therapist, sports.

## 2014-09-08 NOTE — Consult Note (Signed)
Name: Rachel Walters MRN: [id number] DOB: [birth year]    ADMISSION DATE:  09/01/2014 CONSULTATION DATE:  09/08/14  REFERRING MD :  Dr Sloan Leiter  REASON FOR CONSULTATION:  Acute on chronic resp failure  SIGNIFICANT EVENTS  09/02/14 Admitted with progressive dyspnea, fever, sputum, hypotension 09/03/14 SLP eval > recommends regular diet, thin liquids 09/04/14 palpitations >> MAT, evaluated by cardiology  STUDIES:  Autoimmune labs 06/24/14 >> ANA, CRP, RF, a-Jo Ab, aldolase, a-scl 70, SSA, SSB, CCP, a-centromere all negative Esophogram 07/04/14 >> no overt aspiration / penetration but mild to moderate esophageal dysmotility LE Doppler US 09/02/14 >> R popliteal and post tibial DVT CT-PA 09/04/14 >> no evidence PE, RLL spiculated nodule may be slightly larger than 05/28/14, patchy interstitial opacities RUL, lingula. Small B effusions. Very little interval change compared with high res CT 05/17/14.  TTE 09/05/14 >> hyperdynamic LV, mild LVH, EF 65-70%, normal LA, normal RA and PA pressures  HISTORY OF PRESENT ILLNESS:  81 yo woman, former smoker (25 pk-yrs), hx HTN, neuromuscular dysfxn, GERD, esophageal dysmotility, followed by Dr Lake Bells for grade C-D COPD and for mild ILD in an NSIP pattern for which she was on tapering steroids December '15 - January '16. She also has a slowly enlarging RUL pulmonary nodule (PET negative) that they have been following with serial imaging. She was admitted 2/8 with fever, gray sputum, progressive dyspnea, suspected sepsis and was treated with abx for bronchitis / PNA. Her course has been c/b discovery of RLE DVT for which she was started on anticoagulation. A CT-PA from 2/10 did not find PE but there was some evidence for slight increase in pneumonitis worrisome for aspiration. Swallow eval 2/9 was largely reassuring. She developed palpitation and was found to be in irregular rhythm 2/10, was seen by cardiology for MAT (new). She has been started on diltiazem and now  b-blockade.  PCCM consulted to assist with her care.   SUBJECTIVE:  Pt tells me that she is feeling better. She says that she prefers the current nebulized medications to her usual home Advair. She asks about getting these for home. She also noted that she has tremor and anxiety w albuterol, seems to prefer xopenex.   PAST MEDICAL HISTORY :   has a past medical history of Arthritis; COPD (chronic obstructive pulmonary disease); Hypertension; Headache(784.0); Neuromuscular disorder; Asthmatic bronchitis; Esophageal reflux (05/19/2012); Dyspnea (04/23/2014); ILD (interstitial lung disease) (05/24/2014); H. pylori infection; Hiatal hernia; Presbyesophagus; Esophageal dysmotility; and Leg DVT (deep venous thromboembolism), acute (09/03/2014).  has past surgical history that includes Neck surgery (1992 and 1993) and Balloon dilation (05/19/2012). Prior to Admission medications   Medication Sig Start Date End Date Taking? Authorizing Provider  Aclidinium Bromide (TUDORZA PRESSAIR) 400 MCG/ACT AEPB Inhale 1 puff into the lungs 2 (two) times daily. 05/24/14  Yes Tammy S Parrett, NP  acyclovir (ZOVIRAX) 800 MG tablet Take 800 mg by mouth at bedtime.   Yes Historical Provider, MD  amLODipine (NORVASC) 2.5 MG tablet Take 2.5 mg by mouth daily.    Yes Historical Provider, MD  Calcium Carbonate-Vitamin D (CALCIUM 600+D) 600-200 MG-UNIT TABS Take 1 tablet by mouth daily.     Yes Historical Provider, MD  cholecalciferol (VITAMIN D) 1000 UNITS tablet Take 1,000 Units by mouth daily.     Yes Historical Provider, MD  CLORAZEPATE DIPOTASSIUM PO Take 7.5 mg by mouth at bedtime.    Yes Historical Provider, MD  Fluticasone-Salmeterol (ADVAIR) 250-50 MCG/DOSE AEPB Inhale 1 puff into the lungs every 12 (twelve)  hours. 06/24/14  Yes Juanito Doom, MD  lisinopril (PRINIVIL,ZESTRIL) 40 MG tablet Take 40 mg by mouth every evening.   Yes Historical Provider, MD  metoprolol (LOPRESSOR) 50 MG tablet Take 50 mg by mouth 2 (two)  times daily.     Yes Historical Provider, MD  Multiple Vitamins-Minerals (CENTRUM SILVER ULTRA WOMENS) TABS Take 1 tablet by mouth daily.     Yes Historical Provider, MD  omeprazole (PRILOSEC) 20 MG capsule Take 20 mg by mouth.   Yes Historical Provider, MD  pravastatin (PRAVACHOL) 10 MG tablet Take 20 mg by mouth every evening.    Yes Historical Provider, MD  PROAIR HFA 108 (90 BASE) MCG/ACT inhaler Inhale 2 puffs into the lungs every 6 (six) hours as needed for wheezing or shortness of breath.  03/25/14  Yes Historical Provider, MD  traMADol (ULTRAM) 50 MG tablet Take 100 mg by mouth at bedtime.    Yes Historical Provider, MD  vitamin E 100 UNIT capsule Take 100 Units by mouth daily.     Yes Historical Provider, MD  methylPREDNISolone (MEDROL) 16 MG tablet Take 2 each morning on a full stomach until seen back in office by Dr. Lake Bells Patient not taking: Reported on 09/02/2014 08/15/14   Juanito Doom, MD  olmesartan (BENICAR) 40 MG tablet Take 40 mg by mouth daily.    Historical Provider, MD   Allergies  Allergen Reactions  . Sulfa Antibiotics Other (See Comments)    Skin turned yellow    FAMILY HISTORY:  family history includes Breast cancer in her sister; Diabetes in her maternal aunt, mother, and sister. SOCIAL HISTORY:  reports that she quit smoking about 3 years ago. Her smoking use included Cigarettes. She has a 25 pack-year smoking history. She has never used smokeless tobacco. She reports that she does not drink alcohol or use illicit drugs.  REVIEW OF SYSTEMS:   As per HPI, all systems reviewed  VITAL SIGNS: Temp:  [98.1 F (36.7 C)-99.1 F (37.3 C)] 98.4 F (36.9 C) (02/14 0409) Pulse Rate:  [72-93] 77 (02/14 0409) Resp:  [18-25] 25 (02/14 0409) BP: (121-140)/(60-67) 140/62 mmHg (02/14 0409) SpO2:  [92 %-100 %] 99 % (02/14 0409) Weight:  [58.968 kg (130 lb)] 58.968 kg (130 lb) (02/14 0409)  PHYSICAL EXAMINATION: General:  Pleasant elderly woman in no distress on  3L/min Neuro:  Awake, alert, oriented, non-focal HEENT:  OP moist, poor dentition, PERRL Cardiovascular:  Mostly regular with occasional PAC, no M, no edema Lungs:  Clear B, no wheezes or crackles Abdomen:  Soft, NT, + BS Musculoskeletal:  No deformities Skin:  Bruising / ecchymoses and dry skin B LE's   Recent Labs Lab 09/04/14 0752 09/05/14 0444 09/06/14 0519  NA 140 138 137  K 3.9 3.3* 3.5  CL 106 105 103  CO2 26 23 26   BUN 6 7 9   CREATININE 0.82 0.81 0.86  GLUCOSE 86 74 96    Recent Labs Lab 09/03/14 1000 09/04/14 0752 09/05/14 0444  HGB 12.2 11.8* 11.9*  HCT 37.0 35.1* 35.5*  WBC 6.8 6.5 6.2  PLT 155 191 196   All imaging reviewed from CT chest 05/17/14 to the present.   ASSESSMENT:  1 - Acute-on-chronic respiratory failure due to apparent bronchitis vs PNA, suspected aspiration pneumonitis 2 - Severe COPD 3 - Mild ILD (negative auto-immune w/u), possibly due to chronic GERD/aspiration, on a steroid taper from 12/'15 4 - Newly discovered RLE DVT, started on apixaban 5 - New MAT, started on  diltiazem and Bystolic 6 - Slowly enlarging, PET negative, RUL nodule 7 - chronic hypoxemia, 3L/min at home  Recs:  Agree with all of her current management. It does appear that she had a bronchitis / AE-COPD +/- pneumonitis. Unclear whether she aspirated (she does not remember doing so) certainly possible. She prefers nebulized BD's, particularly xopenex compared to albuterol. I believe she could be be converted from Advair to atrovent + xopenex nebs qid at discharge, particularly given the new MAT. Will review this with Dr Lake Bells. Her evaluations for pulmonary nodule and ILD are ongoing. We will follow with you.    Baltazar Apo, MD, PhD 09/08/2014, 10:01 AM  Pulmonary and Critical Care [phone] or if no answer [phone]

## 2014-09-08 NOTE — Progress Notes (Signed)
PATIENT DETAILS Name: Rachel Walters Age: 81 y.o. Sex: female Date of Birth: Nov 24, 1925 Admit Date: 09/01/2014 Admitting Physician Orson Eva, MD UKG:URKY,[id number] R, MD  Subjective: No major issues overnight  Assessment/Plan: Principal Problem:   Sepsis: Current suspicion is from aspiration pneumonitis. Sepsis pathophysiology has resolved with  empiric antibiotics. Blood cultures from 2/8 negative so far.  Active Problems:  ? Aspiration pneumonitis: Although no evidence of consolidation in x-ray chest and CT chest, given history of esophageal dysmotility and no other foci of infection apparent to explain the fever and sepsis pathophysiology on admission, currently presumed that she may have aspiration pneumonitis. Blood cultures negative, vancomycin discontinued on 2/10, completed a course of cefepime on 2/14.    Multifocal atrial tachycardia: Hospital course complicated by development of MAT, seen by cardiology and initially started on IV Cardizem, continues to have tachycardia mostly with ambulation. Started on [employer]. Cardiology continues to follow. Monitor in telemetry.      Leg DVT (deep venous thromboembolism), acute: Started on  Eliquis.CT angiogram of chest negative.      History of COPD on chronic home O2: Lungs clear this am, continue with bronchodilators. Continue on oxygen.     History of chronic hypoxemic respiratory failure: Secondary to COPD, on home O2.     Essential hypertension: Currently controlled with Cardizem and Bystolic. Lisinopril, amlodipine and metoprolol were discontinued on admission because of hypotension.     History of interstitial lung disease: Continue outpatient follow-up with pulmonary.autoimmune workup negative . Appreciate PCCM input.    Spiculated 1.0 cm nodule within the right lower lobe: Outpatient follow-up with pulmonology  Disposition: Remain inpatient-SNF 2/15  Antibiotics:  See below  Anti-infectives    Start      Dose/Rate Route Frequency Ordered Stop   09/04/14 0600  vancomycin (VANCOCIN) IVPB 1000 mg/200 mL premix  Status:  Discontinued     1,000 mg 200 mL/hr over 60 Minutes Intravenous Every 48 hours 09/02/14 0319 09/02/14 1431   09/03/14 0400  vancomycin (VANCOCIN) IVPB 750 mg/150 ml premix  Status:  Discontinued     750 mg 150 mL/hr over 60 Minutes Intravenous Every 24 hours 09/02/14 1431 09/04/14 1432   09/02/14 2200  acyclovir (ZOVIRAX) tablet 800 mg     800 mg Oral Daily at bedtime 09/02/14 0459     09/02/14 2200  ceFEPIme (MAXIPIME) 1 g in dextrose 5 % 50 mL IVPB     1 g 100 mL/hr over 30 Minutes Intravenous Every 24 hours 09/02/14 0319 09/07/14 2121   09/02/14 0000  vancomycin (VANCOCIN) IVPB 1000 mg/200 mL premix     1,000 mg 200 mL/hr over 60 Minutes Intravenous  Once 09/01/14 2357 09/02/14 0158   09/02/14 0000  ceFEPIme (MAXIPIME) 1 g in dextrose 5 % 50 mL IVPB     1 g 100 mL/hr over 30 Minutes Intravenous  Once 09/01/14 2357 09/02/14 0059      DVT Prophylaxis: Eliquis  Code Status: Full code  Family Communication Son and daughter in law at bedside on 2/13  Procedures:  None  CONSULTS:  cardiology   PCCM  MEDICATIONS: Scheduled Meds: . acyclovir  800 mg Oral QHS  . [START ON 09/10/2014] apixaban  5 mg Oral BID   Followed by  . apixaban  10 mg Oral BID  . calcium-vitamin D  1 tablet Oral Q breakfast  . cholecalciferol  1,000 Units Oral Daily  . diltiazem  90 mg Oral 4 times per  day  . ipratropium  0.25 mg Nebulization TID  . levalbuterol  0.63 mg Nebulization TID  . nebivolol  5 mg Oral Daily  . pantoprazole  40 mg Oral Daily  . pravastatin  20 mg Oral QPM   Continuous Infusions:   PRN Meds:.acetaminophen **OR** acetaminophen, ALPRAZolam, alum & mag hydroxide-simeth, HYDROmorphone (DILAUDID) injection, levalbuterol, ondansetron **OR** ondansetron (ZOFRAN) IV, oxyCODONE    PHYSICAL EXAM: Vital signs in last 24 hours: Filed Vitals:   09/08/14 0008  09/08/14 0409 09/08/14 0809 09/08/14 0837  BP: 136/64 140/62 133/59   Pulse: 72 77 83   Temp: 98.5 F (36.9 C) 98.4 F (36.9 C) 98.5 F (36.9 C)   TempSrc: Oral Oral Oral   Resp: 19 25 22    Height:      Weight:  58.968 kg (130 lb)    SpO2: 100% 99% 95% 100%    Weight change: -0.499 kg (-1 lb 1.6 oz) Filed Weights   09/02/14 1119 09/07/14 0400 09/08/14 0409  Weight: 61.236 kg (135 lb) 59.467 kg (131 lb 1.6 oz) 58.968 kg (130 lb)   Body mass index is 21.63 kg/(m^2).   Gen Exam: Awake and alert with clear speech.   Neck: Supple, No JVD.   Chest: B/L Clear.  No rhonchi or rales. CVS: S1 S2 Regular, no murmurs.  Abdomen: soft, BS +, non tender, non distended.  Extremities: no edema, lower extremities warm to touch. Neurologic: Non Focal.  Skin: No Rash. Wounds: N/A.    Intake/Output from previous day:  Intake/Output Summary (Last 24 hours) at 09/08/14 1126 Last data filed at 09/08/14 0530  Gross per 24 hour  Intake    170 ml  Output   1125 ml  Net   -955 ml     LAB RESULTS: CBC  Recent Labs Lab 09/01/14 2359 09/03/14 1000 09/04/14 0752 09/05/14 0444  WBC 9.8 6.8 6.5 6.2  HGB 13.4 12.2 11.8* 11.9*  HCT 40.0 37.0 35.1* 35.5*  PLT 187 155 191 196  MCV 100.3* 100.3* 97.8 99.7  MCH 33.6 33.1 32.9 33.4  MCHC 33.5 33.0 33.6 33.5  RDW 13.4 13.6 13.6 13.6  LYMPHSABS 0.5*  --   --   --   MONOABS 0.9  --   --   --   EOSABS 0.2  --   --   --   BASOSABS 0.1  --   --   --     Chemistries   Recent Labs Lab 09/01/14 2359 09/03/14 1000 09/04/14 0752 09/05/14 0444 09/06/14 0519  NA 136 141 140 138 137  K 3.9 3.6 3.9 3.3* 3.5  CL 98 105 106 105 103  CO2 31 27 26 23 26   GLUCOSE 127* 93 86 74 96  BUN 18 7 6 7 9   CREATININE 1.21* 0.95 0.82 0.81 0.86  CALCIUM 9.1 8.4 8.2* 8.3* 8.4    CBG: No results for input(s): GLUCAP in the last 168 hours.  GFR Estimated Creatinine Clearance: 42.3 mL/min (by C-G formula based on Cr of 0.86).  Coagulation profile No  results for input(s): INR, PROTIME in the last 168 hours.  Cardiac Enzymes No results for input(s): CKMB, TROPONINI, MYOGLOBIN in the last 168 hours.  Invalid input(s): CK  Invalid input(s): POCBNP No results for input(s): DDIMER in the last 72 hours. No results for input(s): HGBA1C in the last 72 hours. No results for input(s): CHOL, HDL, LDLCALC, TRIG, CHOLHDL, LDLDIRECT in the last 72 hours. No results for input(s): TSH, T4TOTAL, T3FREE, THYROIDAB  in the last 72 hours.  Invalid input(s): FREET3 No results for input(s): VITAMINB12, FOLATE, FERRITIN, TIBC, IRON, RETICCTPCT in the last 72 hours. No results for input(s): LIPASE, AMYLASE in the last 72 hours.  Urine Studies No results for input(s): UHGB, CRYS in the last 72 hours.  Invalid input(s): UACOL, UAPR, USPG, UPH, UTP, UGL, UKET, UBIL, UNIT, UROB, ULEU, UEPI, UWBC, URBC, UBAC, CAST, UCOM, BILUA  MICROBIOLOGY: Recent Results (from the past 240 hour(s))  Blood culture (routine x 2)     Status: None (Preliminary result)   Collection Time: 09/02/14 12:09 AM  Result Value Ref Range Status   Specimen Description BLOOD RIGHT WRIST  Final   Special Requests BOTTLES DRAWN AEROBIC AND ANAEROBIC 3CC EA  Final   Culture   Final           BLOOD CULTURE RECEIVED NO GROWTH TO DATE CULTURE WILL BE HELD FOR 5 DAYS BEFORE ISSUING A FINAL NEGATIVE REPORT Performed at [facility]    Report Status PENDING  Incomplete  Blood culture (routine x 2)     Status: None (Preliminary result)   Collection Time: 09/02/14 12:16 AM  Result Value Ref Range Status   Specimen Description BLOOD LEFT WRIST  Final   Special Requests BOTTLES DRAWN AEROBIC AND ANAEROBIC 3CC EA  Final   Culture   Final           BLOOD CULTURE RECEIVED NO GROWTH TO DATE CULTURE WILL BE HELD FOR 5 DAYS BEFORE ISSUING A FINAL NEGATIVE REPORT Performed at [facility]    Report Status PENDING  Incomplete  Urine culture     Status: None   Collection Time:  09/02/14  1:29 AM  Result Value Ref Range Status   Specimen Description URINE, CLEAN CATCH  Final   Special Requests NONE  Final   Colony Count NO GROWTH Performed at [facility]   Final   Culture NO GROWTH Performed at [facility]   Final   Report Status 09/03/2014 FINAL  Final  MRSA PCR Screening     Status: None   Collection Time: 09/04/14  8:16 PM  Result Value Ref Range Status   MRSA by PCR NEGATIVE NEGATIVE Final    Comment:        The GeneXpert MRSA Assay (FDA approved for NASAL specimens only), is one component of a comprehensive MRSA colonization surveillance program. It is not intended to diagnose MRSA infection nor to guide or monitor treatment for MRSA infections.     RADIOLOGY STUDIES/RESULTS: Ct Angio Chest Pe W/cm &/or Wo Cm  09/04/2014   CLINICAL DATA:  Acute onset of shortness of breath and hypoxemia. Known DVT. Initial encounter.  EXAM: CT ANGIOGRAPHY CHEST WITH CONTRAST  TECHNIQUE: Multidetector CT imaging of the chest was performed using the standard protocol during bolus administration of intravenous contrast. Multiplanar CT image reconstructions and MIPs were obtained to evaluate the vascular anatomy.  CONTRAST:  86mL OMNIPAQUE IOHEXOL 350 MG/ML SOLN  COMPARISON:  CT of the chest performed 05/17/2014, and chest radiograph performed 09/01/2014  FINDINGS: There is no evidence of pulmonary embolus.  Trace bilateral pleural effusions are noted. Underlying interstitial prominence is seen. Mild peripheral scarring is noted at the left lingula. The focal patchy airspace opacity near the right lung apex is relatively stable, though additional new nodular opacities are seen. This may reflect an atypical infection or possibly mild interstitial edema, though as before, a slow-growing malignancy cannot be excluded.  A spiculated nodule  is noted within the right lower lobe (image 51 of 93), measuring 1.0 cm in size. This may be slightly increased in size, but  is difficult to compare with prior studies. Additional smaller pulmonary nodules are seen within the right lung. There is no evidence of significant focal consolidation, pleural effusion or pneumothorax. No masses are identified; no abnormal focal contrast enhancement is seen.  Mild scattered coronary artery calcification is seen. Prominent precarinal nodes are seen, measuring up to 1.4 cm in short axis. Remaining visualized mediastinal nodes are normal in size, with a calcified subcarinal node seen. No hilar lymphadenopathy is identified. No pericardial effusion is seen.  Scattered calcification is noted along the aortic arch and proximal great vessels. No axillary lymphadenopathy is seen. The visualized portions of the thyroid gland are unremarkable in appearance.  The visualized portions of the liver are unremarkable. Scattered calcified granulomata are seen within the liver. A tiny hiatal hernia is seen. The visualized portions of the pancreas and adrenal glands are unremarkable. Mild scattered calcification is noted along the proximal superior mesenteric artery.  No acute osseous abnormalities are seen. Cervical spinal fusion hardware is partially imaged. Chronic compression deformities are seen at T9, T12 and L2.  Review of the MIP images confirms the above findings.  IMPRESSION: 1. No evidence of pulmonary embolus. 2. Trace bilateral pleural effusions. Underlying interstitial prominence noted. Patchy airspace opacities within the right upper lobe may reflect a mild infectious process or possibly mild pulmonary edema. The underlying focal opacity near the right lung apex could reflect atypical infection, though as before, a slow-growing malignancy cannot be excluded. 3. Spiculated 1.0 cm nodule within the right lower lobe. This may be slightly increased in size, but is difficult to compare with prior studies. As previously suggested, would perform follow-up CT of the chest in 3 months, if tissue sampling is  not done before then. 4. Underlying smaller pulmonary nodules noted. 5. Prominent precarinal nodes, measuring up to 1.4 cm in short axis. These appear increased in size, and would be amenable to biopsy, as deemed clinically appropriate. 6. Tiny hiatal hernia noted. 7. Mild scattered calcification along the proximal superior mesenteric artery. 8. Chronic compression deformities at T9, T12 and L2.   Electronically Signed   By: Garald Balding M.D.   On: 09/04/2014 05:54   Dg Chest Port 1 View  09/02/2014   CLINICAL DATA:  Shortness of breath tonight.  History of COPD.  EXAM: PORTABLE CHEST - 1 VIEW  COMPARISON:  CT chest 05/17/2014.  Chest 07/08/2011.  FINDINGS: Normal heart size and pulmonary vascularity. Hyperinflation suggesting emphysema. Scattered interstitial changes in the lungs probably due to fibrosis. No focal consolidation. No blunting of costophrenic angles. No pneumothorax. Calcified and tortuous aorta. Postoperative changes in the cervical spine. Degenerative changes in the shoulders and spine. Old left rib fractures.  IMPRESSION: Emphysematous changes in the lungs with scattered interstitial fibrosis. No focal consolidation.   Electronically Signed   By: Lucienne Capers M.D.   On: 09/02/2014 00:22    Oren Binet, MD  [facility] Pager:[phone]  If 7PM-7AM, please contact night-coverage [email or web address] Password [email or web address] 09/08/2014, 11:26 AM   LOS: 6 days

## 2014-09-08 NOTE — Progress Notes (Signed)
Patient ID: Rachel Walters, female   DOB: 01/14/1926, 81 y.o.   MRN: [id number]  Patient Profile: 81 female with a history of COPD on home O2, HTN, esophageal dysmotility, neuromuscular disorder, interstitial lung disease. She was admitted with sepsis thought to be a pulmonary source from chronic aspiration. She also has a DVT and was started on Eliquis. CTA negative for PE.Cardiology asked to see for multifocal atrial tachycardia.   Subjective  :Baseline dyspnea no palpitations or chest pain    Objective: Vital signs in last 24 hours: Temp:  [98.1 F (36.7 C)-99.1 F (37.3 C)] 98.4 F (36.9 C) (02/14 0409) Pulse Rate:  [72-93] 77 (02/14 0409) Resp:  [18-25] 25 (02/14 0409) BP: (121-140)/(60-67) 140/62 mmHg (02/14 0409) SpO2:  [92 %-100 %] 99 % (02/14 0409) Weight:  [58.968 kg (130 lb)] 58.968 kg (130 lb) (02/14 0409) Last BM Date: 09/05/14  Intake/Output from previous day: 02/13 0701 - 02/14 0700 In: 170 [P.O.:120; IV Piggyback:50] Out: 8127 [Urine:1125] Intake/Output this shift:    Medications Current Facility-Administered Medications  Medication Dose Route Frequency Provider Last Rate Last Dose  . acetaminophen (TYLENOL) tablet 650 mg  650 mg Oral Q6H PRN Theressa Millard, MD   650 mg at 09/07/14 2050   Or  . acetaminophen (TYLENOL) suppository 650 mg  650 mg Rectal Q6H PRN Theressa Millard, MD      . acyclovir (ZOVIRAX) tablet 800 mg  800 mg Oral QHS Theressa Millard, MD   800 mg at 09/07/14 2049  . ALPRAZolam Duanne Moron) tablet 0.25 mg  0.25 mg Oral TID PRN Jonetta Osgood, MD   0.25 mg at 09/07/14 2050  . alum & mag hydroxide-simeth (MAALOX/MYLANTA) 200-200-20 MG/5ML suspension 30 mL  30 mL Oral Q6H PRN Theressa Millard, MD      . Derrill Memo ON 09/10/2014] apixaban (ELIQUIS) tablet 5 mg  5 mg Oral BID Jake Church Masters, RPH       Followed by  . apixaban Arne Cleveland) tablet 10 mg  10 mg Oral BID Jake Church Masters, RPH   10 mg at 09/07/14 2050  . calcium-vitamin D (OSCAL  WITH D) 500-200 MG-UNIT per tablet 1 tablet  1 tablet Oral Q breakfast Theressa Millard, MD   1 tablet at 09/07/14 0804  . cholecalciferol (VITAMIN D) tablet 1,000 Units  1,000 Units Oral Daily Theressa Millard, MD   1,000 Units at 09/07/14 0920  . diltiazem (CARDIZEM) tablet 90 mg  90 mg Oral 4 times per day Kiyoshi Schaab M [place], MD   90 mg at 09/07/14 2315  . HYDROmorphone (DILAUDID) injection 0.5-1 mg  0.5-1 mg Intravenous Q3H PRN Theressa Millard, MD      . ipratropium (ATROVENT) nebulizer solution 0.25 mg  0.25 mg Nebulization TID Jonetta Osgood, MD   0.25 mg at 09/07/14 1954  . levalbuterol (XOPENEX) nebulizer solution 0.63 mg  0.63 mg Nebulization TID Jonetta Osgood, MD   0.63 mg at 09/07/14 1954  . levalbuterol (XOPENEX) nebulizer solution 0.63 mg  0.63 mg Nebulization Q6H PRN Ritta Slot, NP   0.63 mg at 09/07/14 0547  . nebivolol (BYSTOLIC) tablet 5 mg  5 mg Oral Daily Josue Hector, MD   5 mg at 09/07/14 5170  . ondansetron (ZOFRAN) tablet 4 mg  4 mg Oral Q6H PRN Theressa Millard, MD       Or  . ondansetron (ZOFRAN) injection 4 mg  4 mg Intravenous Q6H PRN Theressa Millard, MD      .  oxyCODONE (Oxy IR/ROXICODONE) immediate release tablet 5 mg  5 mg Oral Q4H PRN Theressa Millard, MD   5 mg at 09/05/14 2257  . pantoprazole (PROTONIX) EC tablet 40 mg  40 mg Oral Daily Theressa Millard, MD   40 mg at 09/07/14 0920  . pravastatin (PRAVACHOL) tablet 20 mg  20 mg Oral QPM Theressa Millard, MD   20 mg at 09/07/14 1743    PE: General appearance: alert, cooperative and no distress Neck: no carotid bruit and no JVD Lungs: bibasilar crackles, no wheezes or rhonchi Heart: regular ryhthm. tachy rate Extremities: no edema Pulses: 2+ and symmetric Skin: warm and dry Neurologic: Grossly normal  Lab Results:  No results for input(s): WBC, HGB, HCT, PLT in the last 72 hours. BMET  Recent Labs  09/06/14 0519  NA 137  K 3.5  CL 103  CO2 26  GLUCOSE 96  BUN 9  CREATININE  0.86  CALCIUM 8.4    Studies/Results: 2D echo 09/05/14 Study Conclusions  - Left ventricle: Small underfilled hyperdynamic LV The cavity size was normal. Wall thickness was increased in a pattern of mild LVH. Systolic function was vigorous. The estimated ejection fraction was in the range of 65% to 70%. Wall motion was normal; there were no regional wall motion abnormalities.   Assessment/Plan  Principal Problem:   Sepsis Active Problems:   Essential hypertension   COPD (chronic obstructive pulmonary disease)   Chronic hypoxemic respiratory failure   Hypotension   SIRS (systemic inflammatory response syndrome)   Acute on chronic respiratory failure   Leg DVT (deep venous thromboembolism), acute   Multifocal atrial tachycardia   1. Multifocal Atrial Tachycardia: related to pulmonary issues  Echo with normal EF  xopenex for inhaler  Continue cardizem .  Had first dose of bystolic yesterday  HR 85 at rest now No wheezing exacerbation.  Consider increasing dose in 2 days.  Ok to go to rehab from our [place], [place] 09/08/2014 8:12 AM

## 2014-09-09 ENCOUNTER — Ambulatory Visit: Payer: Medicare Other | Admitting: Pulmonary Disease

## 2014-09-09 MED ORDER — APIXABAN 5 MG PO TABS: 5.0000 mg | ORAL_TABLET | Freq: Two times a day (BID) | ORAL | Status: DC

## 2014-09-09 MED ORDER — LEVALBUTEROL HCL 0.63 MG/3ML IN NEBU: 0.6300 mg | INHALATION_SOLUTION | Freq: Four times a day (QID) | RESPIRATORY_TRACT | Status: DC | PRN

## 2014-09-09 MED ORDER — DILTIAZEM HCL 90 MG PO TABS: 90.0000 mg | ORAL_TABLET | Freq: Four times a day (QID) | ORAL | Status: DC

## 2014-09-09 MED ORDER — NEBIVOLOL HCL 5 MG PO TABS: 5.0000 mg | ORAL_TABLET | Freq: Every day | ORAL | Status: DC

## 2014-09-09 MED ORDER — IPRATROPIUM BROMIDE 0.02 % IN SOLN: 0.2500 mg | Freq: Four times a day (QID) | RESPIRATORY_TRACT | Status: DC

## 2014-09-09 MED ORDER — OXYCODONE HCL 5 MG PO TABS: 5.0000 mg | ORAL_TABLET | Freq: Four times a day (QID) | ORAL | Status: DC | PRN

## 2014-09-09 MED ORDER — LEVALBUTEROL HCL 0.63 MG/3ML IN NEBU: 0.6300 mg | INHALATION_SOLUTION | Freq: Four times a day (QID) | RESPIRATORY_TRACT | Status: DC

## 2014-09-09 MED ORDER — ALBUTEROL SULFATE HFA 108 (90 BASE) MCG/ACT IN AERS: 2.0000 | INHALATION_SPRAY | RESPIRATORY_TRACT | Status: DC | PRN

## 2014-09-09 MED ORDER — APIXABAN 5 MG PO TABS: 10.0000 mg | ORAL_TABLET | Freq: Two times a day (BID) | ORAL | Status: DC

## 2014-09-09 MED ORDER — ALPRAZOLAM 0.25 MG PO TABS: 0.2500 mg | ORAL_TABLET | Freq: Three times a day (TID) | ORAL | Status: DC | PRN

## 2014-09-09 NOTE — Discharge Summary (Signed)
PATIENT DETAILS Name: Rachel Walters Age: 81 y.o. Sex: female Date of Birth: 10-12-1925 MRN: [id number]. Admitting Physician: Orson Eva, MD DJS:HFWY,OVZCHYIFOY R, MD  Admit Date: 09/01/2014 Discharge date: 09/09/2014  Recommendations for Outpatient Follow-up:  Repeat CBC/BMET in 1-2 weeks Will need outpatient Pul Follow up for ILD and lung nodule-see below  PRIMARY DISCHARGE DIAGNOSIS:  Principal Problem:   Sepsis Active Problems:   Essential hypertension   COPD (chronic obstructive pulmonary disease)   Chronic hypoxemic respiratory failure   Hypotension   SIRS (systemic inflammatory response syndrome)   Acute on chronic respiratory failure   Leg DVT (deep venous thromboembolism), acute   Multifocal atrial tachycardia      PAST MEDICAL HISTORY: Past Medical History  Diagnosis Date  . Arthritis   . COPD (chronic obstructive pulmonary disease)   . Hypertension   . Headache(784.0)   . Neuromuscular disorder   . Asthmatic bronchitis   . Esophageal reflux 05/19/2012  . Dyspnea 04/23/2014  . ILD (interstitial lung disease) 05/24/2014  . H. pylori infection   . Hiatal hernia   . Presbyesophagus   . Esophageal dysmotility   . Leg DVT (deep venous thromboembolism), acute 09/03/2014    DISCHARGE MEDICATIONS: Current Discharge Medication List    START taking these medications   Details  ALPRAZolam (XANAX) 0.25 MG tablet Take 1 tablet (0.25 mg total) by mouth 3 (three) times daily as needed for anxiety. Qty: 30 tablet, Refills: 0    !! apixaban (ELIQUIS) 5 MG TABS tablet Take 2 tablets (10 mg total) by mouth 2 (two) times daily. Last dose 2/15 pm Qty: 60 tablet, Refills: 0    !! apixaban (ELIQUIS) 5 MG TABS tablet Take 1 tablet (5 mg total) by mouth 2 (two) times daily. Start 2/16 at 10 am Qty: 60 tablet    diltiazem (CARDIZEM) 90 MG tablet Take 1 tablet (90 mg total) by mouth every 6 (six) hours.    ipratropium (ATROVENT) 0.02 % nebulizer solution Take 1.25 mLs (0.25  mg total) by nebulization 4 (four) times daily. Qty: 75 mL, Refills: 12    !! levalbuterol (XOPENEX) 0.63 MG/3ML nebulizer solution Take 3 mLs (0.63 mg total) by nebulization 4 (four) times daily. Qty: 3 mL, Refills: 12    !! levalbuterol (XOPENEX) 0.63 MG/3ML nebulizer solution Take 3 mLs (0.63 mg total) by nebulization every 6 (six) hours as needed for wheezing or shortness of breath. Qty: 3 mL, Refills: 12    nebivolol (BYSTOLIC) 5 MG tablet Take 1 tablet (5 mg total) by mouth daily. Qty: 30 tablet    oxyCODONE (OXY IR/ROXICODONE) 5 MG immediate release tablet Take 1 tablet (5 mg total) by mouth every 6 (six) hours as needed for moderate pain. Qty: 30 tablet, Refills: 0     !! - Potential duplicate medications found. Please discuss with provider.    CONTINUE these medications which have CHANGED   Details  albuterol (PROAIR HFA) 108 (90 BASE) MCG/ACT inhaler Inhale 2 puffs into the lungs every 4 (four) hours as needed for wheezing or shortness of breath.   Associated Diagnoses: Shortness of breath; Dyspnea; Chronic hypoxemic respiratory failure      CONTINUE these medications which have NOT CHANGED   Details  acyclovir (ZOVIRAX) 800 MG tablet Take 800 mg by mouth at bedtime.    Calcium Carbonate-Vitamin D (CALCIUM 600+D) 600-200 MG-UNIT TABS Take 1 tablet by mouth daily.      cholecalciferol (VITAMIN D) 1000 UNITS tablet Take 1,000 Units by mouth daily.  omeprazole (PRILOSEC) 20 MG capsule Take 20 mg by mouth.    pravastatin (PRAVACHOL) 10 MG tablet Take 20 mg by mouth every evening.     vitamin E 100 UNIT capsule Take 100 Units by mouth daily.        STOP taking these medications     Aclidinium Bromide (TUDORZA PRESSAIR) 400 MCG/ACT AEPB      amLODipine (NORVASC) 2.5 MG tablet      CLORAZEPATE DIPOTASSIUM PO      Fluticasone-Salmeterol (ADVAIR) 250-50 MCG/DOSE AEPB      lisinopril (PRINIVIL,ZESTRIL) 40 MG tablet      metoprolol (LOPRESSOR) 50 MG tablet       Multiple Vitamins-Minerals (CENTRUM SILVER ULTRA WOMENS) TABS      traMADol (ULTRAM) 50 MG tablet      methylPREDNISolone (MEDROL) 16 MG tablet      olmesartan (BENICAR) 40 MG tablet         ALLERGIES:   Allergies  Allergen Reactions  . Sulfa Antibiotics Other (See Comments)    Skin turned yellow    BRIEF HPI:  See H&P, Labs, Consult and Test reports for all details in brief, patient is a 81 y.o. female with a history of O2 Dependent COPD and ILD, HTN, and Esophageal Dysmotility who presented to the ED with complaints of worsening SOB and Productive Cough over the past 2-3 days prior to her admission.She was evaluated in the ED and her Chest X-Ray revealed COPD changes but no infiltrates, however her vitals revealed hypotension and tachycardia and hypoxemia. A sepsis workup was initiated and she was placed on IV Vancomycin and Cefepime and referred for admission.  CONSULTATIONS:   cardiology and pulmonary/intensive care  PERTINENT RADIOLOGIC STUDIES: Ct Angio Chest Pe W/cm &/or Wo Cm  09/04/2014   CLINICAL DATA:  Acute onset of shortness of breath and hypoxemia. Known DVT. Initial encounter.  EXAM: CT ANGIOGRAPHY CHEST WITH CONTRAST  TECHNIQUE: Multidetector CT imaging of the chest was performed using the standard protocol during bolus administration of intravenous contrast. Multiplanar CT image reconstructions and MIPs were obtained to evaluate the vascular anatomy.  CONTRAST:  38mL OMNIPAQUE IOHEXOL 350 MG/ML SOLN  COMPARISON:  CT of the chest performed 05/17/2014, and chest radiograph performed 09/01/2014  FINDINGS: There is no evidence of pulmonary embolus.  Trace bilateral pleural effusions are noted. Underlying interstitial prominence is seen. Mild peripheral scarring is noted at the left lingula. The focal patchy airspace opacity near the right lung apex is relatively stable, though additional new nodular opacities are seen. This may reflect an atypical infection or possibly  mild interstitial edema, though as before, a slow-growing malignancy cannot be excluded.  A spiculated nodule is noted within the right lower lobe (image 51 of 93), measuring 1.0 cm in size. This may be slightly increased in size, but is difficult to compare with prior studies. Additional smaller pulmonary nodules are seen within the right lung. There is no evidence of significant focal consolidation, pleural effusion or pneumothorax. No masses are identified; no abnormal focal contrast enhancement is seen.  Mild scattered coronary artery calcification is seen. Prominent precarinal nodes are seen, measuring up to 1.4 cm in short axis. Remaining visualized mediastinal nodes are normal in size, with a calcified subcarinal node seen. No hilar lymphadenopathy is identified. No pericardial effusion is seen.  Scattered calcification is noted along the aortic arch and proximal great vessels. No axillary lymphadenopathy is seen. The visualized portions of the thyroid gland are unremarkable in appearance.  The visualized  portions of the liver are unremarkable. Scattered calcified granulomata are seen within the liver. A tiny hiatal hernia is seen. The visualized portions of the pancreas and adrenal glands are unremarkable. Mild scattered calcification is noted along the proximal superior mesenteric artery.  No acute osseous abnormalities are seen. Cervical spinal fusion hardware is partially imaged. Chronic compression deformities are seen at T9, T12 and L2.  Review of the MIP images confirms the above findings.  IMPRESSION: 1. No evidence of pulmonary embolus. 2. Trace bilateral pleural effusions. Underlying interstitial prominence noted. Patchy airspace opacities within the right upper lobe may reflect a mild infectious process or possibly mild pulmonary edema. The underlying focal opacity near the right lung apex could reflect atypical infection, though as before, a slow-growing malignancy cannot be excluded. 3.  Spiculated 1.0 cm nodule within the right lower lobe. This may be slightly increased in size, but is difficult to compare with prior studies. As previously suggested, would perform follow-up CT of the chest in 3 months, if tissue sampling is not done before then. 4. Underlying smaller pulmonary nodules noted. 5. Prominent precarinal nodes, measuring up to 1.4 cm in short axis. These appear increased in size, and would be amenable to biopsy, as deemed clinically appropriate. 6. Tiny hiatal hernia noted. 7. Mild scattered calcification along the proximal superior mesenteric artery. 8. Chronic compression deformities at T9, T12 and L2.   Electronically Signed   By: Garald Balding M.D.   On: 09/04/2014 05:54   Dg Chest Port 1 View  09/02/2014   CLINICAL DATA:  Shortness of breath tonight.  History of COPD.  EXAM: PORTABLE CHEST - 1 VIEW  COMPARISON:  CT chest 05/17/2014.  Chest 07/08/2011.  FINDINGS: Normal heart size and pulmonary vascularity. Hyperinflation suggesting emphysema. Scattered interstitial changes in the lungs probably due to fibrosis. No focal consolidation. No blunting of costophrenic angles. No pneumothorax. Calcified and tortuous aorta. Postoperative changes in the cervical spine. Degenerative changes in the shoulders and spine. Old left rib fractures.  IMPRESSION: Emphysematous changes in the lungs with scattered interstitial fibrosis. No focal consolidation.   Electronically Signed   By: Lucienne Capers M.D.   On: 09/02/2014 00:22     PERTINENT LAB RESULTS: CBC: No results for input(s): WBC, HGB, HCT, PLT in the last 72 hours. CMET CMP     Component Value Date/Time   NA 137 09/06/2014 0519   K 3.5 09/06/2014 0519   CL 103 09/06/2014 0519   CO2 26 09/06/2014 0519   GLUCOSE 96 09/06/2014 0519   BUN 9 09/06/2014 0519   CREATININE 0.86 09/06/2014 0519   CALCIUM 8.4 09/06/2014 0519   GFRNONAA 59* 09/06/2014 0519   GFRAA 69* 09/06/2014 0519    GFR Estimated Creatinine Clearance:  42.3 mL/min (by C-G formula based on Cr of 0.86). No results for input(s): LIPASE, AMYLASE in the last 72 hours. No results for input(s): CKTOTAL, CKMB, CKMBINDEX, TROPONINI in the last 72 hours. Invalid input(s): POCBNP No results for input(s): DDIMER in the last 72 hours. No results for input(s): HGBA1C in the last 72 hours. No results for input(s): CHOL, HDL, LDLCALC, TRIG, CHOLHDL, LDLDIRECT in the last 72 hours. No results for input(s): TSH, T4TOTAL, T3FREE, THYROIDAB in the last 72 hours.  Invalid input(s): FREET3 No results for input(s): VITAMINB12, FOLATE, FERRITIN, TIBC, IRON, RETICCTPCT in the last 72 hours. Coags: No results for input(s): INR in the last 72 hours.  Invalid input(s): PT Microbiology: Recent Results (from the past 240 hour(s))  Blood culture (routine x 2)     Status: None   Collection Time: 09/02/14 12:09 AM  Result Value Ref Range Status   Specimen Description BLOOD RIGHT WRIST  Final   Special Requests BOTTLES DRAWN AEROBIC AND ANAEROBIC 3CC EA  Final   Culture   Final    NO GROWTH 5 DAYS Performed at [facility]    Report Status 09/08/2014 FINAL  Final  Blood culture (routine x 2)     Status: None   Collection Time: 09/02/14 12:16 AM  Result Value Ref Range Status   Specimen Description BLOOD LEFT WRIST  Final   Special Requests BOTTLES DRAWN AEROBIC AND ANAEROBIC 3CC EA  Final   Culture   Final    NO GROWTH 5 DAYS Performed at [facility]    Report Status 09/08/2014 FINAL  Final  Urine culture     Status: None   Collection Time: 09/02/14  1:29 AM  Result Value Ref Range Status   Specimen Description URINE, CLEAN CATCH  Final   Special Requests NONE  Final   Colony Count NO GROWTH Performed at [facility]   Final   Culture NO GROWTH Performed at [facility]   Final   Report Status 09/03/2014 FINAL  Final  MRSA PCR Screening     Status: None   Collection Time: 09/04/14  8:16 PM  Result Value Ref Range  Status   MRSA by PCR NEGATIVE NEGATIVE Final    Comment:        The GeneXpert MRSA Assay (FDA approved for NASAL specimens only), is one component of a comprehensive MRSA colonization surveillance program. It is not intended to diagnose MRSA infection nor to guide or monitor treatment for MRSA infections.      BRIEF HOSPITAL COURSE:  Sepsis: Current suspicion is from aspiration pneumonitis. Sepsis pathophysiology has resolved with empiric antibiotics. Blood cultures from 2/8 negative so far.  Active Problems: ? Aspiration pneumonitis: Although no evidence of consolidation in x-ray chest and CT chest, given history of esophageal dysmotility and no other foci of infection apparent to explain the fever and sepsis pathophysiology on admission, currently presumed that she may have aspiration pneumonitis. Blood cultures negative, vancomycin discontinued on 2/10, completed a course of cefepime on 2/14.No need for further antibiotics on discharge.   Multifocal atrial tachycardia: Hospital course complicated by development of MAT, seen by cardiology and initially started on IV Cardizem,which was converted to oral cardizem. Since continued to have tachycardia mostly with ambulation, she was started on Bystolic-with good rate control. She will require follow up with Cardiology.    Leg DVT (deep venous thromboembolism), acute: Started on Eliquis.CT angiogram of chest negative. Please treat for atleast 3-6 months   History of COPD on chronic home O2: Lungs clear this am, continue with bronchodilators. Continue on oxygen.   History of chronic hypoxemic respiratory failure: Secondary to COPD, on home O2.   Essential hypertension: Currently controlled with Cardizem and Bystolic. Lisinopril, amlodipine and metoprolol were discontinued on admission because of hypotension.    History of interstitial lung disease: Continue outpatient follow-up with pulmonary.autoimmune workup negative .  Appreciate PCCM input.   Spiculated 1.0 cm nodule within the right lower lobe: Outpatient follow-up with pulmonology    TODAY-DAY OF DISCHARGE:  Subjective:   Rachel Walters today has no headache,no chest abdominal pain,no new weakness tingling or numbness, feels much better wants to go home today.   Objective:   Blood pressure 131/57, pulse 82,  temperature 98.7 F (37.1 C), temperature source Oral, resp. rate 27, height 5\' 5"  (1.651 m), weight 58.967 kg (130 lb), SpO2 99 %.  Intake/Output Summary (Last 24 hours) at 09/09/14 0838 Last data filed at 09/09/14 0200  Gross per 24 hour  Intake   1000 ml  Output   1250 ml  Net   -250 ml   Filed Weights   09/07/14 0400 09/08/14 0409 09/09/14 0410  Weight: 59.467 kg (131 lb 1.6 oz) 58.968 kg (130 lb) 58.967 kg (130 lb)    Exam Awake Alert, Oriented *3, No new F.N deficits, Normal affect [place].AT,PERRAL Supple Neck,No JVD, No cervical lymphadenopathy appriciated.  Symmetrical Chest wall movement, Good air movement bilaterally, CTAB RRR,No Gallops,Rubs or new Murmurs, No Parasternal Heave +ve B.Sounds, Abd Soft, Non tender, No organomegaly appriciated, No rebound -guarding or rigidity. No Cyanosis, Clubbing or edema, No new Rash or bruise  DISCHARGE CONDITION: Stable  DISPOSITION: SNF  DISCHARGE INSTRUCTIONS:    Activity:  As tolerated with Full fall precautions use walker/cane & assistance as needed  Diet recommendation: Heart Healthy diet  Discharge Instructions    Call MD for:  difficulty breathing, headache or visual disturbances    Complete by:  As directed      Call MD for:  persistant dizziness or light-headedness    Complete by:  As directed      Diet - low sodium heart healthy    Complete by:  As directed      Increase activity slowly    Complete by:  As directed            Follow-up Information    Follow up with [facility].   Why:  Physical Therapy and Registered Nurse.    Contact  information:   [address] [place] [address] [phone]       Follow up with Tivis Ringer, MD. Schedule an appointment as soon as possible for a visit in 1 week.   Specialty:  Internal Medicine   Contact information:   [place] [address] [phone]       Follow up with Simonne Maffucci, MD. Schedule an appointment as soon as possible for a visit in 2 weeks.   Specialty:  Pulmonary Disease   Contact information:   [place] [address] [phone]       Follow up with Sherren Mocha, MD. Schedule an appointment as soon as possible for a visit in 2 weeks.   Specialty:  Cardiology   Contact information:   [address] [place] [address] [phone]      Total Time spent on discharge equals 45 minutes.  SignedOren Binet 09/09/2014 8:38 AM

## 2014-09-09 NOTE — Progress Notes (Signed)
CSW (Clinical Education officer, museum) prepared pt dc packet and placed with shadow chart. CSW arranged non-emergent ambulance transport for 1:30pm as requested by pt and pt nurse. Pt, pt nurse, and facility informed. Pt to notify her family/friends. CSW signing off.  [place], [place]

## 2014-09-09 NOTE — Progress Notes (Signed)
Physical Therapy Treatment Patient Details Name: Rachel Walters MRN: [id number] DOB: [birth year] Today's Date: 09/09/2014    History of Present Illness Rachel Walters is a 81 y.o. female with a history of O2 Dependent COPD and ILD, HTN, and Esophageal Dysmotility who presents to the ED with complaints of worsening SOB and Productive Cough over the past 2-3 days.  She began to have fevers over the past 24 hours, and reports that she has a chronic cough which has been gray in color but has changed to yellow over thepast 2 days.   She was evaluated in the ED and her Chest X-Ray revealed COPD changes but no infiltrates, however her vitals revealed hypotension and tachycardia and hypoxemia.    A sepsis workup was initiated and she was placed on IV Vancomycin and Cefepime and referred for admission.        PT Comments    Pt admitted with above diagnosis. Pt currently with functional limitations due to balance and endurance deficits. Pt will need NHP to continue to incr her endurance.  Progressing slowly.   Pt will benefit from skilled PT to increase their independence and safety with mobility to allow discharge to the venue listed below.    Follow Up Recommendations  SNF;Supervision/Assistance - 24 hour     Equipment Recommendations  Other (comment) (TBA)    Recommendations for Other Services       Precautions / Restrictions Precautions Precautions: Fall Precaution Comments: monitor HR Restrictions Weight Bearing Restrictions: No    Mobility  Bed Mobility   Bed Mobility: Supine to Sit;Sit to Supine     Supine to sit: Supervision        Transfers Overall transfer level: Needs assistance Equipment used: Rolling walker (2 wheeled) Transfers: Sit to/from Stand Sit to Stand: Min guard         General transfer comment: safe transfer technique but needed min guard assist for stability.   Ambulation/Gait Ambulation/Gait assistance: Min guard Ambulation Distance (Feet): 30  Feet Assistive device: Rolling walker (2 wheeled) Gait Pattern/deviations: Step-through pattern;Decreased step length - right;Decreased step length - left;Shuffle   Gait velocity interpretation: Below normal speed for age/gender General Gait Details: slow and guarded gait, with sats on 3L [place] at 90%-93%.  Pt did need to use 3N1 and was able to clean herself when finished.    Stairs            Wheelchair Mobility    Modified Rankin (Stroke Patients Only)       Balance Overall balance assessment: Needs assistance Sitting-balance support: No upper extremity supported;Feet supported Sitting balance-Leahy Scale: Good     Standing balance support: Bilateral upper extremity supported;During functional activity Standing balance-Leahy Scale: Fair Standing balance comment: can stand statically to clean bottom without UE support.  Needs UE support with challenges.                     Cognition Arousal/Alertness: Awake/alert Behavior During Therapy: WFL for tasks assessed/performed Overall Cognitive Status: Within Functional Limits for tasks assessed                      Exercises      General Comments        Pertinent Vitals/Pain Pain Assessment: No/denies pain  VSS    Home Living                      Prior Function  PT Goals (current goals can now be found in the care plan section) Progress towards PT goals: Progressing toward goals    Frequency  Min 3X/week    PT Plan Current plan remains appropriate    Co-evaluation             End of Session Equipment Utilized During Treatment: Gait belt;Oxygen Activity Tolerance: Patient limited by fatigue Patient left: in chair;with call bell/phone within reach     Time: 0918-0939 PT Time Calculation (min) (ACUTE ONLY): 21 min  Charges:  $Gait Training: 8-22 mins                    G Codes:      Denice Paradise 09-27-14, 1:03 PM [facility] Acute  Rehabilitation [phone] (pager)

## 2014-09-09 NOTE — Progress Notes (Signed)
   Name: Rachel Walters MRN: [id number] DOB: 12/20/1925    ADMISSION DATE:  09/01/2014 CONSULTATION DATE:  09/08/14  REFERRING MD :  Dr Sloan Leiter  REASON FOR CONSULTATION:  Acute on chronic resp failure  SIGNIFICANT EVENTS  09/02/14 Admitted with progressive dyspnea, fever, sputum, hypotension 09/03/14 SLP eval > recommends regular diet, thin liquids 09/04/14 palpitations >> MAT, evaluated by cardiology  STUDIES:  Autoimmune labs 06/24/14 >> ANA, CRP, RF, a-Jo Ab, aldolase, a-scl 70, SSA, SSB, CCP, a-centromere all negative Esophogram 07/04/14 >> no overt aspiration / penetration but mild to moderate esophageal dysmotility LE Doppler US 09/02/14 >> R popliteal and post tibial DVT CT-PA 09/04/14 >> no evidence PE, RLL spiculated nodule may be slightly larger than 05/28/14, patchy interstitial opacities RUL, lingula. Small B effusions. Very little interval change compared with high res CT 05/17/14.  TTE 09/05/14 >> hyperdynamic LV, mild LVH, EF 65-70%, normal LA, normal RA and PA pressures  Subjective: Feels much better this AM.  VITAL SIGNS: Temp:  [98.1 F (36.7 C)-98.9 F (37.2 C)] 98.7 F (37.1 C) (02/15 0837) Pulse Rate:  [75-85] 82 (02/15 0410) Resp:  [18-27] 27 (02/15 0410) BP: (119-131)/(53-64) 131/57 mmHg (02/15 0410) SpO2:  [97 %-99 %] 97 % (02/15 0905) Weight:  [58.967 kg (130 lb)] 58.967 kg (130 lb) (02/15 0410)  PHYSICAL EXAMINATION: General:  Pleasant elderly woman in no distress on 3L/min Neuro:  Awake, alert, oriented, non-focal HEENT:  OP moist, poor dentition, PERRL Cardiovascular:  Mostly regular with occasional PAC, no M, no edema Lungs:  Clear B, no wheezes or crackles Abdomen:  Soft, NT, + BS Musculoskeletal:  No deformities Skin:  Bruising / ecchymoses and dry skin B LE's   Recent Labs Lab 09/04/14 0752 09/05/14 0444 09/06/14 0519  NA 140 138 137  K 3.9 3.3* 3.5  CL 106 105 103  CO2 26 23 26   BUN 6 7 9   CREATININE 0.82 0.81 0.86  GLUCOSE 86 74 96     Recent Labs Lab 09/03/14 1000 09/04/14 0752 09/05/14 0444  HGB 12.2 11.8* 11.9*  HCT 37.0 35.1* 35.5*  WBC 6.8 6.5 6.2  PLT 155 191 196   All imaging reviewed from CT chest 05/17/14 to the present.   ASSESSMENT:  1 - Acute-on-chronic respiratory failure due to apparent bronchitis vs PNA, suspected aspiration pneumonitis 2 - Severe COPD 3 - Mild ILD (negative auto-immune w/u), possibly due to chronic GERD/aspiration, on a steroid taper from 12/'15 4 - Newly discovered RLE DVT, started on apixaban 5 - New MAT, started on diltiazem and Bystolic 6 - Slowly enlarging, PET negative, RUL nodule 7 - chronic hypoxemia, 3L/min at home  Recs:  Continue current bronchodilator therapy. Change advair to flovent/atrovent/xopenex upon discharge given MAT. Pulmonary nodule to be followed by Dr. Lake Bells as outpatient. On home o2 at 3L and stable at that level. No further recommendations from a pulmonary standpoint, please arrange f/u with Dr. [place] Bells upon discharge.  Rush Farmer, M.D. [facility] Pulmonary/Critical Care Medicine. Pager: [phone]. After hours pager: [phone].

## 2014-09-10 ENCOUNTER — Non-Acute Institutional Stay (SKILLED_NURSING_FACILITY): Payer: Medicare Other | Admitting: Internal Medicine

## 2014-09-10 DIAGNOSIS — A419 Sepsis, unspecified organism: Secondary | ICD-10-CM

## 2014-09-10 DIAGNOSIS — I471 Supraventricular tachycardia: Secondary | ICD-10-CM

## 2014-09-10 DIAGNOSIS — R911 Solitary pulmonary nodule: Secondary | ICD-10-CM

## 2014-09-10 DIAGNOSIS — J9611 Chronic respiratory failure with hypoxia: Secondary | ICD-10-CM

## 2014-09-10 DIAGNOSIS — J849 Interstitial pulmonary disease, unspecified: Secondary | ICD-10-CM

## 2014-09-10 DIAGNOSIS — I82402 Acute embolism and thrombosis of unspecified deep veins of left lower extremity: Secondary | ICD-10-CM

## 2014-09-10 DIAGNOSIS — I4719 Other supraventricular tachycardia: Secondary | ICD-10-CM

## 2014-09-10 DIAGNOSIS — I10 Essential (primary) hypertension: Secondary | ICD-10-CM

## 2014-09-10 DIAGNOSIS — J42 Unspecified chronic bronchitis: Secondary | ICD-10-CM

## 2014-09-10 DIAGNOSIS — J690 Pneumonitis due to inhalation of food and vomit: Secondary | ICD-10-CM

## 2014-09-10 NOTE — Progress Notes (Signed)
MRN: [id number] Name: Rachel Walters  Sex: female Age: 81 y.o. DOB: June 06, 1926  [place] #: [facility] Facility/Room: 311 Level Of Care: SNF Provider: Inocencio Homes D Emergency Contacts: Extended Emergency Contact Information Primary Emergency Contact: Phariss,Donna Address: [address] [place]          [place] [address] [place] Phone: [phone] Relation: Other Secondary Emergency Contact: [facility] Address: [place], [place] [address] [place] Phone: [phone] Mobile Phone: [phone] Relation: Son    Allergies: Sulfa antibiotics  Chief Complaint  Patient presents with  . New Admit To SNF    HPI: Patient is 81 y.o. female who was hospitalized for sepsis, felt to be aspiration PNA, admitted to SNF for generalized weakness for OT/PT.  Past Medical History  Diagnosis Date  . Arthritis   . COPD (chronic obstructive pulmonary disease)   . Hypertension   . Headache(784.0)   . Neuromuscular disorder   . Asthmatic bronchitis   . Esophageal reflux 05/19/2012  . Dyspnea 04/23/2014  . ILD (interstitial lung disease) 05/24/2014  . H. pylori infection   . Hiatal hernia   . Presbyesophagus   . Esophageal dysmotility   . Leg DVT (deep venous thromboembolism), acute 09/03/2014    Past Surgical History  Procedure Laterality Date  . Neck surgery  1992 and 1993  . Balloon dilation  05/19/2012    Procedure: BALLOON DILATION;  Surgeon: Lafayette Dragon, MD;  Location: [facility];  Service: Endoscopy;  Laterality: N/A;      Medication List       This list is accurate as of: 09/10/14 11:59 PM.  Always use your most recent med list.               acyclovir 800 MG tablet  Commonly known as:  ZOVIRAX  Take 800 mg by mouth at bedtime.     albuterol 108 (90 BASE) MCG/ACT inhaler  Commonly known as:  PROAIR HFA  Inhale 2 puffs into the lungs every 4 (four) hours as needed for wheezing or shortness of breath.      ALPRAZolam 0.25 MG tablet  Commonly known as:  XANAX  Take 1 tablet (0.25 mg total) by mouth 3 (three) times daily as needed for anxiety.     apixaban 5 MG Tabs tablet  Commonly known as:  ELIQUIS  Take 2 tablets (10 mg total) by mouth 2 (two) times daily. Last dose 2/15 pm     apixaban 5 MG Tabs tablet  Commonly known as:  ELIQUIS  Take 1 tablet (5 mg total) by mouth 2 (two) times daily. Start 2/16 at 10 am     CALCIUM 600+D 600-200 MG-UNIT Tabs  Generic drug:  Calcium Carbonate-Vitamin D  Take 1 tablet by mouth daily.     cholecalciferol 1000 UNITS tablet  Commonly known as:  VITAMIN D  Take 1,000 Units by mouth daily.     diltiazem 90 MG tablet  Commonly known as:  CARDIZEM  Take 1 tablet (90 mg total) by mouth every 6 (six) hours.     ipratropium 0.02 % nebulizer solution  Commonly known as:  ATROVENT  Take 1.25 mLs (0.25 mg total) by nebulization 4 (four) times daily.     levalbuterol 0.63 MG/3ML nebulizer solution  Commonly known as:  XOPENEX  Take 3 mLs (0.63 mg total) by nebulization 4 (four) times daily.     levalbuterol 0.63 MG/3ML  nebulizer solution  Commonly known as:  XOPENEX  Take 3 mLs (0.63 mg total) by nebulization every 6 (six) hours as needed for wheezing or shortness of breath.     nebivolol 5 MG tablet  Commonly known as:  BYSTOLIC  Take 1 tablet (5 mg total) by mouth daily.     omeprazole 20 MG capsule  Commonly known as:  PRILOSEC  Take 20 mg by mouth.     oxyCODONE 5 MG immediate release tablet  Commonly known as:  Oxy IR/ROXICODONE  Take 1 tablet (5 mg total) by mouth every 6 (six) hours as needed for moderate pain.     pravastatin 10 MG tablet  Commonly known as:  PRAVACHOL  Take 20 mg by mouth every evening.     vitamin E 100 UNIT capsule  Take 100 Units by mouth daily.        No orders of the defined types were placed in this encounter.    Immunization History  Administered Date(s) Administered  . Influenza Split 04/15/2014   . Influenza,inj,Quad PF,36+ Mos 04/23/2014  . Pneumococcal Conjugate-13 06/17/2014    History  Substance Use Topics  . Smoking status: Former Smoker -- 0.50 packs/day for 50 years    Types: Cigarettes    Quit date: 07/05/2011  . Smokeless tobacco: Never Used  . Alcohol Use: No    Review of Systems  DATA OBTAINED: from patient, nurse GENERAL:  no fevers, fatigue, appetite changes SKIN: No itching, rash HEENT: No complaint RESPIRATORY: No cough, wheezing, SOB CARDIAC: No chest pain, palpitations, lower extremity edema  GI: No abdominal pain, No N/V/D or constipation, No heartburn or reflux  GU: No dysuria, frequency or urgency, or incontinence  MUSCULOSKELETAL: No unrelieved bone/joint pain NEUROLOGIC: No headache, dizziness  PSYCHIATRIC: No overt anxiety or sadness  Filed Vitals:   09/10/14 1528  BP: 120/74  Pulse: 103  Temp: 97.1 F (36.2 C)  Resp: 20    Physical Exam  GENERAL APPEARANCE: Alert, modconversant,WF on O2 No acute distress  SKIN: No diaphoresis rash HEENT: Unremarkable RESPIRATORY: Breathing is even, unlabored. Lung sounds are diffusely decreased   CARDIOVASCULAR: Heart RRR no murmurs, rubs or gallops. No peripheral edema  GASTROINTESTINAL: Abdomen is soft, non-tender, not distended w/ normal bowel sounds.  GENITOURINARY: Bladder non tender, not distended  MUSCULOSKELETAL: No abnormal joints or musculature NEUROLOGIC: Cranial nerves 2-12 grossly intact PSYCHIATRIC: Mood and affect appropriate to situation, no behavioral issues  Patient Active Problem List   Diagnosis Date Noted  . Multifocal atrial tachycardia 09/05/2014  . Leg DVT (deep venous thromboembolism), acute 09/03/2014  . Hypotension 09/02/2014  . Sepsis 09/02/2014  . SIRS (systemic inflammatory response syndrome) 09/02/2014  . Acute on chronic respiratory failure 09/02/2014  . Aspiration pneumonia   . Pyrexia   . Arterial hypotension   . Pulmonary nodule, right 06/24/2014  . ILD  (interstitial lung disease) 05/24/2014  . Dyspnea 04/23/2014  . Chronic hypoxemic respiratory failure 04/23/2014  . Esophageal reflux 05/19/2012  . Presbyesophagus 05/19/2012  . Dysphagia 05/02/2012  . Fall 07/09/2011  . Multiple fractures of ribs of left side x2 07/09/2011  . COPD (chronic obstructive pulmonary disease) 07/09/2011  . Hyperlipidemia 07/09/2011  . Essential hypertension 08/01/2007  . ASTHMATIC BRONCHITIS, ACUTE 08/01/2007  . ALLERGIC RHINITIS, CHRONIC 08/01/2007  . ARTHRITIS 08/01/2007  . HEADACHE, CHRONIC 08/01/2007  . DIVERTICULOSIS, COLON 07/14/2007    CBC    Component Value Date/Time   WBC 6.2 09/05/2014 0444   RBC 3.56* 09/05/2014 0444  HGB 11.9* 09/05/2014 0444   HCT 35.5* 09/05/2014 0444   PLT 196 09/05/2014 0444   MCV 99.7 09/05/2014 0444   LYMPHSABS 0.5* 09/01/2014 2359   MONOABS 0.9 09/01/2014 2359   EOSABS 0.2 09/01/2014 2359   BASOSABS 0.1 09/01/2014 2359    CMP     Component Value Date/Time   NA 137 09/06/2014 0519   K 3.5 09/06/2014 0519   CL 103 09/06/2014 0519   CO2 26 09/06/2014 0519   GLUCOSE 96 09/06/2014 0519   BUN 9 09/06/2014 0519   CREATININE 0.86 09/06/2014 0519   CALCIUM 8.4 09/06/2014 0519   GFRNONAA 59* 09/06/2014 0519   GFRAA 69* 09/06/2014 0519    Assessment and Plan  Sepsis Current suspicion is from aspiration pneumonitis. Sepsis pathophysiology has resolved with empiric antibiotics. Blood cultures from 2/8 negative so far.    Aspiration pneumonia Although no evidence of consolidation in x-ray chest and CT chest, given history of esophageal dysmotility and no other foci of infection apparent to explain the fever and sepsis pathophysiology on admission, currently presumed that she may have aspiration pneumonitis. Blood cultures negative, vancomycin discontinued on 2/10, completed a course of cefepime on 2/14.No need for further antibiotics on discharge.   Multifocal atrial tachycardia : Hospital course  complicated by development of MAT, seen by cardiology and initially started on IV Cardizem,which was converted to oral cardizem. Since continued to have tachycardia mostly with ambulation, she was started on Bystolic-with good rate control. She will require follow up with Cardiology.    Leg DVT (deep venous thromboembolism), acute Started on Eliquis.CT angiogram of chest negative. Please treat for atleast 3-6 months   Chronic hypoxemic respiratory failure : Secondary to COPD, on home O2   COPD (chronic obstructive pulmonary disease) Continue nebs and home O2   Essential hypertension Currently controlled with Cardizem and Bystolic. Lisinopril, amlodipine and metoprolol were discontinued on admission because of hypotension.    ILD (interstitial lung disease) Continue outpatient follow-up with pulmonary.autoimmune workup negative . Appreciate PCCM input.    Pulmonary nodule, right Outpatient follow-up with pulmonology     Hennie Duos, MD

## 2014-09-11 ENCOUNTER — Encounter: Payer: Self-pay | Admitting: Internal Medicine

## 2014-09-11 NOTE — Assessment & Plan Note (Signed)
Currently controlled with Cardizem and Bystolic. Lisinopril, amlodipine and metoprolol were discontinued on admission because of hypotension.

## 2014-09-11 NOTE — Assessment & Plan Note (Signed)
:   Secondary to COPD, on home O2

## 2014-09-11 NOTE — Assessment & Plan Note (Signed)
Although no evidence of consolidation in x-ray chest and CT chest, given history of esophageal dysmotility and no other foci of infection apparent to explain the fever and sepsis pathophysiology on admission, currently presumed that she may have aspiration pneumonitis. Blood cultures negative, vancomycin discontinued on 2/10, completed a course of cefepime on 2/14.No need for further antibiotics on discharge.

## 2014-09-11 NOTE — Assessment & Plan Note (Signed)
Started on Eliquis.CT angiogram of chest negative. Please treat for atleast 3-6 months

## 2014-09-11 NOTE — Assessment & Plan Note (Signed)
Current suspicion is from aspiration pneumonitis. Sepsis pathophysiology has resolved with empiric antibiotics. Blood cultures from 2/8 negative so far.

## 2014-09-11 NOTE — Assessment & Plan Note (Signed)
Continue nebs and home O2

## 2014-09-11 NOTE — Assessment & Plan Note (Signed)
Continue outpatient follow-up with pulmonary.autoimmune workup negative . Appreciate PCCM input.

## 2014-09-11 NOTE — Assessment & Plan Note (Signed)
:   Hospital course complicated by development of MAT, seen by cardiology and initially started on IV Cardizem,which was converted to oral cardizem. Since continued to have tachycardia mostly with ambulation, she was started on Bystolic-with good rate control. She will require follow up with Cardiology.

## 2014-09-11 NOTE — Assessment & Plan Note (Signed)
Outpatient follow-up with pulmonology ° °

## 2014-09-13 ENCOUNTER — Non-Acute Institutional Stay (SKILLED_NURSING_FACILITY): Payer: Medicare Other | Admitting: Nurse Practitioner

## 2014-09-13 DIAGNOSIS — T148 Other injury of unspecified body region: Secondary | ICD-10-CM

## 2014-09-13 DIAGNOSIS — T148XXA Other injury of unspecified body region, initial encounter: Secondary | ICD-10-CM

## 2014-09-13 DIAGNOSIS — K5901 Slow transit constipation: Secondary | ICD-10-CM

## 2014-09-13 NOTE — Progress Notes (Signed)
Patient ID: Rachel Walters, female   DOB: 04-28-1926, 81 y.o.   MRN: [id number]    PCP: Tivis Ringer, MD  Allergies  Allergen Reactions  . Sulfa Antibiotics Other (See Comments)    Skin turned yellow    Chief Complaint  Patient presents with  . Acute Visit     HPI: Patient is a 81 y.o. female seen at [facility] today for acute visit per nursing. Staff concerned over purple and blue areas to right lower leg. Pt hits theses areas frequently and reports they are always bruised. Does not report increase in pain. Also would like miralax added for constipation. Has not had regular BM in several days and takes miralax daily at home to help her stay regular.  Review of Systems:  Review of Systems  Constitutional: Negative for activity change, appetite change, fatigue and unexpected weight change.  Eyes: Negative.   Respiratory: Negative for cough and shortness of breath.   Cardiovascular: Negative for chest pain, palpitations and leg swelling.  Gastrointestinal: Positive for constipation. Negative for abdominal pain and diarrhea.  Skin: Negative for wound.  Neurological: Negative for dizziness.    Past Medical History  Diagnosis Date  . Arthritis   . COPD (chronic obstructive pulmonary disease)   . Hypertension   . Headache(784.0)   . Neuromuscular disorder   . Asthmatic bronchitis   . Esophageal reflux 05/19/2012  . Dyspnea 04/23/2014  . ILD (interstitial lung disease) 05/24/2014  . H. pylori infection   . Hiatal hernia   . Presbyesophagus   . Esophageal dysmotility   . Leg DVT (deep venous thromboembolism), acute 09/03/2014   Past Surgical History  Procedure Laterality Date  . Neck surgery  1992 and 1993  . Balloon dilation  05/19/2012    Procedure: BALLOON DILATION;  Surgeon: Lafayette Dragon, MD;  Location: [facility];  Service: Endoscopy;  Laterality: N/A;   Social History:   reports that she quit smoking about 3 years ago. Her smoking use included Cigarettes. She has  a 25 pack-year smoking history. She has never used smokeless tobacco. She reports that she does not drink alcohol or use illicit drugs.  Family History  Problem Relation Age of Onset  . Diabetes Mother   . Diabetes Sister   . Diabetes Maternal Aunt   . Breast cancer Sister     Medications: Patient's Medications  New Prescriptions   No medications on file  Previous Medications   ACYCLOVIR (ZOVIRAX) 800 MG TABLET    Take 800 mg by mouth at bedtime.   ALBUTEROL (PROAIR HFA) 108 (90 BASE) MCG/ACT INHALER    Inhale 2 puffs into the lungs every 4 (four) hours as needed for wheezing or shortness of breath.   ALPRAZOLAM (XANAX) 0.25 MG TABLET    Take 1 tablet (0.25 mg total) by mouth 3 (three) times daily as needed for anxiety.   APIXABAN (ELIQUIS) 5 MG TABS TABLET    Take 2 tablets (10 mg total) by mouth 2 (two) times daily. Last dose 2/15 pm   APIXABAN (ELIQUIS) 5 MG TABS TABLET    Take 1 tablet (5 mg total) by mouth 2 (two) times daily. Start 2/16 at 10 am   CALCIUM CARBONATE-VITAMIN D (CALCIUM 600+D) 600-200 MG-UNIT TABS    Take 1 tablet by mouth daily.     CHOLECALCIFEROL (VITAMIN D) 1000 UNITS TABLET    Take 1,000 Units by mouth daily.     DILTIAZEM (CARDIZEM) 90 MG TABLET    Take 1 tablet (  90 mg total) by mouth every 6 (six) hours.   IPRATROPIUM (ATROVENT) 0.02 % NEBULIZER SOLUTION    Take 1.25 mLs (0.25 mg total) by nebulization 4 (four) times daily.   LEVALBUTEROL (XOPENEX) 0.63 MG/3ML NEBULIZER SOLUTION    Take 3 mLs (0.63 mg total) by nebulization 4 (four) times daily.   LEVALBUTEROL (XOPENEX) 0.63 MG/3ML NEBULIZER SOLUTION    Take 3 mLs (0.63 mg total) by nebulization every 6 (six) hours as needed for wheezing or shortness of breath.   NEBIVOLOL (BYSTOLIC) 5 MG TABLET    Take 1 tablet (5 mg total) by mouth daily.   OMEPRAZOLE (PRILOSEC) 20 MG CAPSULE    Take 20 mg by mouth.   OXYCODONE (OXY IR/ROXICODONE) 5 MG IMMEDIATE RELEASE TABLET    Take 1 tablet (5 mg total) by mouth every 6  (six) hours as needed for moderate pain.   PRAVASTATIN (PRAVACHOL) 10 MG TABLET    Take 20 mg by mouth every evening.    VITAMIN E 100 UNIT CAPSULE    Take 100 Units by mouth daily.    Modified Medications   No medications on file  Discontinued Medications   No medications on file     Physical Exam:  Filed Vitals:   09/13/14 1332  BP: 127/69  Pulse: 73  Temp: 97.1 F (36.2 C)  Resp: 20    Physical Exam  Constitutional: She is oriented to person, place, and time. No distress.  Thin female in NAD  Neck: Neck supple.  Cardiovascular: Normal rate, regular rhythm and normal heart sounds.   Pulmonary/Chest: Effort normal and breath sounds normal.  Abdominal: Soft. Bowel sounds are normal. She exhibits no distension. There is no tenderness.  Musculoskeletal: She exhibits no edema or tenderness.  Neurological: She is alert and oriented to person, place, and time.  Skin: Skin is warm and dry. Bruising (to bilateral LE) noted. She is not diaphoretic.  Psychiatric: She has a normal mood and affect.    Labs reviewed: Basic Metabolic Panel:  Recent Labs  09/04/14 0752 09/05/14 0444 09/06/14 0519  NA 140 138 137  K 3.9 3.3* 3.5  CL 106 105 103  CO2 26 23 26   GLUCOSE 86 74 96  BUN 6 7 9   CREATININE 0.82 0.81 0.86  CALCIUM 8.2* 8.3* 8.4   Liver Function Tests: No results for input(s): AST, ALT, ALKPHOS, BILITOT, PROT, ALBUMIN in the last 8760 hours. No results for input(s): LIPASE, AMYLASE in the last 8760 hours. No results for input(s): AMMONIA in the last 8760 hours. CBC:  Recent Labs  04/23/14 1626 09/01/14 2359 09/03/14 1000 09/04/14 0752 09/05/14 0444  WBC 8.9 9.8 6.8 6.5 6.2  NEUTROABS 6.6 8.1*  --   --   --   HGB 14.0 13.4 12.2 11.8* 11.9*  HCT 41.6 40.0 37.0 35.1* 35.5*  MCV 99.7 100.3* 100.3* 97.8 99.7  PLT 245.0 187 155 191 196   Lipid Panel: No results for input(s): CHOL, HDL, LDLCALC, TRIG, CHOLHDL, LDLDIRECT in the last 8760 hours. TSH: No  results for input(s): TSH in the last 8760 hours. A1C: No results found for: HGBA1C   Assessment/Plan 1. Contusion Reports she hits her legs frequently on the [facility], staff to monitor and provided care as needed  2. Slow transit constipation miralax 17 gm PO daily

## 2014-09-17 ENCOUNTER — Encounter: Payer: Self-pay | Admitting: Nurse Practitioner

## 2014-09-17 DIAGNOSIS — I82401 Acute embolism and thrombosis of unspecified deep veins of right lower extremity: Secondary | ICD-10-CM | POA: Insufficient documentation

## 2014-09-19 ENCOUNTER — Non-Acute Institutional Stay (SKILLED_NURSING_FACILITY): Payer: Medicare Other | Admitting: Internal Medicine

## 2014-09-19 ENCOUNTER — Encounter: Payer: Self-pay | Admitting: Internal Medicine

## 2014-09-19 DIAGNOSIS — J690 Pneumonitis due to inhalation of food and vomit: Secondary | ICD-10-CM

## 2014-09-19 DIAGNOSIS — J849 Interstitial pulmonary disease, unspecified: Secondary | ICD-10-CM

## 2014-09-19 DIAGNOSIS — J9611 Chronic respiratory failure with hypoxia: Secondary | ICD-10-CM

## 2014-09-19 DIAGNOSIS — I4719 Other supraventricular tachycardia: Secondary | ICD-10-CM

## 2014-09-19 DIAGNOSIS — I10 Essential (primary) hypertension: Secondary | ICD-10-CM

## 2014-09-19 DIAGNOSIS — I82401 Acute embolism and thrombosis of unspecified deep veins of right lower extremity: Secondary | ICD-10-CM

## 2014-09-19 DIAGNOSIS — A419 Sepsis, unspecified organism: Secondary | ICD-10-CM

## 2014-09-19 DIAGNOSIS — R911 Solitary pulmonary nodule: Secondary | ICD-10-CM

## 2014-09-19 DIAGNOSIS — I471 Supraventricular tachycardia: Secondary | ICD-10-CM

## 2014-09-19 NOTE — Progress Notes (Signed)
MRN: [id number] Name: Rachel Walters  Sex: female Age: 81 y.o. DOB: October 15, 1925  [place] #: [facility] Facility/Room: 311 Level Of Care: SNF Provider: Inocencio Homes D Emergency Contacts: Extended Emergency Contact Information Primary Emergency Contact: Noorani,Donna Address: [place]          [place] [address] [place] Phone: [phone] Relation: Other Secondary Emergency Contact: [facility] Address: [place],  [address] [place] Phone: [phone] Mobile Phone: [phone] Relation: Son  Code Status:FULL   Allergies: Sulfa antibiotics  Chief Complaint  Patient presents with  . Discharge Note    HPI: Patient is 81 y.o. female who was admitted to SNF for generalized weakness s/p hospitalization for aspiration PNA and sepsis who is now ready to be d/c to home.  Past Medical History  Diagnosis Date  . Arthritis   . COPD (chronic obstructive pulmonary disease)   . Hypertension   . Headache(784.0)   . Neuromuscular disorder   . Asthmatic bronchitis   . Esophageal reflux 05/19/2012  . Dyspnea 04/23/2014  . ILD (interstitial lung disease) 05/24/2014  . H. pylori infection   . Hiatal hernia   . Presbyesophagus   . Esophageal dysmotility   . Leg DVT (deep venous thromboembolism), acute 09/03/2014    Past Surgical History  Procedure Laterality Date  . Neck surgery  1992 and 1993  . Balloon dilation  05/19/2012    Procedure: BALLOON DILATION;  Surgeon: Lafayette Dragon, MD;  [facility]: [facility];  Service: Endoscopy;  Laterality: N/A;      Medication List       This list is accurate as of: 09/19/14  4:02 PM.  Always use your most recent med list.               acyclovir 800 MG tablet  Commonly known as:  ZOVIRAX  Take 800 mg by mouth at bedtime.     albuterol 108 (90 BASE) MCG/ACT inhaler  Commonly known as:  PROAIR HFA  Inhale 2 puffs into the lungs every 4 (four) hours as needed for  wheezing or shortness of breath.     ALPRAZolam 0.25 MG tablet  Commonly known as:  XANAX  Take 1 tablet (0.25 mg total) by mouth 3 (three) times daily as needed for anxiety.     apixaban 5 MG Tabs tablet  Commonly known as:  ELIQUIS  Take 2 tablets (10 mg total) by mouth 2 (two) times daily. Last dose 2/15 pm     apixaban 5 MG Tabs tablet  Commonly known as:  ELIQUIS  Take 1 tablet (5 mg total) by mouth 2 (two) times daily. Start 2/16 at 10 am     CALCIUM 600+D 600-200 MG-UNIT Tabs  Generic drug:  Calcium Carbonate-Vitamin D  Take 1 tablet by mouth daily.     cholecalciferol 1000 UNITS tablet  Commonly known as:  VITAMIN D  Take 1,000 Units by mouth daily.     diltiazem 90 MG tablet  Commonly known as:  CARDIZEM  Take 1 tablet (90 mg total) by mouth every 6 (six) hours.     ipratropium 0.02 % nebulizer solution  Commonly known as:  ATROVENT  Take 1.25 mLs (0.25 mg total) by nebulization 4 (four) times daily.     levalbuterol 0.63 MG/3ML nebulizer solution  Commonly known as:  XOPENEX  Take 3 mLs (0.63 mg total) by nebulization 4 (four) times daily.  levalbuterol 0.63 MG/3ML nebulizer solution  Commonly known as:  XOPENEX  Take 3 mLs (0.63 mg total) by nebulization every 6 (six) hours as needed for wheezing or shortness of breath.     nebivolol 5 MG tablet  Commonly known as:  BYSTOLIC  Take 1 tablet (5 mg total) by mouth daily.     omeprazole 20 MG capsule  Commonly known as:  PRILOSEC  Take 20 mg by mouth.     oxyCODONE 5 MG immediate release tablet  Commonly known as:  Oxy IR/ROXICODONE  Take 1 tablet (5 mg total) by mouth every 6 (six) hours as needed for moderate pain.     pravastatin 10 MG tablet  Commonly known as:  PRAVACHOL  Take 20 mg by mouth every evening.     vitamin E 100 UNIT capsule  Take 100 Units by mouth daily.        No orders of the defined types were placed in this encounter.    Immunization History  Administered Date(s)  Administered  . Influenza Split 04/15/2014  . Influenza,inj,Quad PF,36+ Mos 04/23/2014  . Pneumococcal Conjugate-13 06/17/2014    History  Substance Use Topics  . Smoking status: Former Smoker -- 0.50 packs/day for 50 years    Types: Cigarettes    Quit date: 07/05/2011  . Smokeless tobacco: Never Used  . Alcohol Use: No    Filed Vitals:   09/19/14 1547  BP: 161/83  Pulse: 73  Temp: 97.2 F (36.2 C)  Resp: 18    Physical Exam  GENERAL APPEARANCE: Alert, No acute distress.  HEENT: Unremarkable. RESPIRATORY: Breathing is even, unlabored. Lung sounds are clear   CARDIOVASCULAR: Heart RRR no murmurs, rubs or gallops. No peripheral edema.  GASTROINTESTINAL: Abdomen is soft, non-tender, not distended w/ normal bowel sounds.  NEUROLOGIC: Cranial nerves 2-12 grossly intact  Patient Active Problem List   Diagnosis Date Noted  . Right leg DVT 09/17/2014  . Multifocal atrial tachycardia 09/05/2014  . Leg DVT (deep venous thromboembolism), acute 09/03/2014  . Hypotension 09/02/2014  . Sepsis 09/02/2014  . SIRS (systemic inflammatory response syndrome) 09/02/2014  . Acute on chronic respiratory failure 09/02/2014  . Aspiration pneumonia   . Pyrexia   . Arterial hypotension   . Pulmonary nodule, right 06/24/2014  . ILD (interstitial lung disease) 05/24/2014  . Dyspnea 04/23/2014  . Chronic hypoxemic respiratory failure 04/23/2014  . Esophageal reflux 05/19/2012  . Presbyesophagus 05/19/2012  . Dysphagia 05/02/2012  . Fall 07/09/2011  . Multiple fractures of ribs of left side x2 07/09/2011  . COPD (chronic obstructive pulmonary disease) 07/09/2011  . Hyperlipidemia 07/09/2011  . Essential hypertension 08/01/2007  . ASTHMATIC BRONCHITIS, ACUTE 08/01/2007  . ALLERGIC RHINITIS, CHRONIC 08/01/2007  . ARTHRITIS 08/01/2007  . HEADACHE, CHRONIC 08/01/2007  . DIVERTICULOSIS, COLON 07/14/2007    CBC    Component Value Date/Time   WBC 6.2 09/05/2014 0444   RBC 3.56*  09/05/2014 0444   HGB 11.9* 09/05/2014 0444   HCT 35.5* 09/05/2014 0444   PLT 196 09/05/2014 0444   MCV 99.7 09/05/2014 0444   LYMPHSABS 0.5* 09/01/2014 2359   MONOABS 0.9 09/01/2014 2359   EOSABS 0.2 09/01/2014 2359   BASOSABS 0.1 09/01/2014 2359    CMP     Component Value Date/Time   NA 137 09/06/2014 0519   K 3.5 09/06/2014 0519   CL 103 09/06/2014 0519   CO2 26 09/06/2014 0519   GLUCOSE 96 09/06/2014 0519   BUN 9 09/06/2014 0519   CREATININE  0.86 09/06/2014 0519   CALCIUM 8.4 09/06/2014 0519   GFRNONAA 59* 09/06/2014 0519   GFRAA 69* 09/06/2014 0519    Assessment and Plan  Pt is stable for d/c to home with HH/OT/PT. Pt will need O2 2L  continuously and a WC.  Hennie Duos, MD

## 2014-09-24 ENCOUNTER — Ambulatory Visit: Payer: Medicare Other | Admitting: Internal Medicine

## 2014-09-24 ENCOUNTER — Encounter: Payer: Medicare Other | Admitting: Physician Assistant

## 2014-09-24 DIAGNOSIS — J9620 Acute and chronic respiratory failure, unspecified whether with hypoxia or hypercapnia: Secondary | ICD-10-CM | POA: Diagnosis not present

## 2014-09-24 DIAGNOSIS — R269 Unspecified abnormalities of gait and mobility: Secondary | ICD-10-CM | POA: Diagnosis not present

## 2014-09-24 DIAGNOSIS — I82409 Acute embolism and thrombosis of unspecified deep veins of unspecified lower extremity: Secondary | ICD-10-CM | POA: Diagnosis not present

## 2014-09-24 DIAGNOSIS — I2609 Other pulmonary embolism with acute cor pulmonale: Secondary | ICD-10-CM | POA: Diagnosis not present

## 2014-09-26 ENCOUNTER — Encounter: Payer: Self-pay | Admitting: Adult Health

## 2014-09-26 ENCOUNTER — Ambulatory Visit (INDEPENDENT_AMBULATORY_CARE_PROVIDER_SITE_OTHER): Payer: Medicare Other | Admitting: Adult Health

## 2014-09-26 VITALS — BP 124/68 | HR 102 | Temp 98.2°F | Ht 65.0 in | Wt 134.6 lb

## 2014-09-26 DIAGNOSIS — J42 Unspecified chronic bronchitis: Secondary | ICD-10-CM

## 2014-09-26 DIAGNOSIS — J849 Interstitial pulmonary disease, unspecified: Secondary | ICD-10-CM

## 2014-09-26 DIAGNOSIS — J9611 Chronic respiratory failure with hypoxia: Secondary | ICD-10-CM

## 2014-09-26 DIAGNOSIS — I82401 Acute embolism and thrombosis of unspecified deep veins of right lower extremity: Secondary | ICD-10-CM

## 2014-09-26 MED ORDER — APIXABAN 5 MG PO TABS: 5.0000 mg | ORAL_TABLET | Freq: Two times a day (BID) | ORAL | Status: DC

## 2014-09-26 MED ORDER — LEVALBUTEROL HCL 0.63 MG/3ML IN NEBU: 0.6300 mg | INHALATION_SOLUTION | Freq: Four times a day (QID) | RESPIRATORY_TRACT | Status: DC

## 2014-09-26 MED ORDER — IPRATROPIUM BROMIDE 0.02 % IN SOLN: 0.2500 mg | Freq: Four times a day (QID) | RESPIRATORY_TRACT | Status: DC

## 2014-09-26 NOTE — Assessment & Plan Note (Signed)
Unprovoked DVT on Right  Continue on Eliquis -expense may be an issue for pt.  ?Duration of therapy -first event but unprovoked ? If going to stop therapy at 6 months for DVT below the knee consider repeat venous doppler.

## 2014-09-26 NOTE — Progress Notes (Signed)
Subjective:    Patient ID: Rachel Walters, female    DOB: [birth year], 81 y.o.   MRN: [id number]  HPI  81 yo female with neuromuscular dysfxn, COPD-Gold D and Mild ILD -NSIP pattern , and lung nodule   09/26/2014 Post Hospital follow up  Patient returns for a post hospital follow-up She was admitted February 7 through February 15 for sepsis, acute on chronic respiratory failure, acute right  DVT. CT chest on February 10 was negative for PE with evidence of slight increase in pneumonitis worrisome for aspiration. Swallow evaluation was neg.  Previous autoimmune lab workup was negative. 2-D echo showed an EF of 65-70%, mild LVH, normal right atrium and pulmonary artery pressures. She was treated with aggressive IV antibiotics, steroids, nebulized bronchodilators. Patient did have some MAT and was seen by cardiology. Started on diltiazem and Bystolic. Patient was started on Eliquis for DVT .  She is followed for a slowly enlarging right upper lobe pulmonary nodule with PET negative. Follow-up CT chest in May was planned. Remains on at O3l/ m.  Has had some falls since discharge, bruising along hip and neck. No LOC , no headache, visual/speech changes or memory . A shunt education given on blood thinners and risk of falls. Was on inhalers before says she could not tolerate, stopped them before admission.  Started on Xopenex and Atrovent in hospital.  Was started on Steroids x 2 in Nov and Jan but did not tolerate.  Says she is better since discharge but very weak.  Has some lingering cough and congestion .  She denies any hemoptysis, chest pain, orthopnea, PND, or increased leg swelling.   Review of Systems Constitutional:   No  weight loss, night sweats,  Fevers, chills, + fatigue, or  lassitude.  HEENT:   No headaches,  Difficulty swallowing,  Tooth/dental problems, or  Sore throat,                No sneezing, itching, ear ache, nasal congestion, post nasal drip,   CV:  No chest pain,   Orthopnea, PND, swelling in lower extremities, anasarca, dizziness, palpitations, syncope.   GI  No heartburn, indigestion, abdominal pain, nausea, vomiting, diarrhea, change in bowel habits, loss of appetite, bloody stools.   Resp ,  No non-productive cough,  No coughing up of blood.  No change in color of mucus.  No wheezing.  No chest wall deformity  Skin: no rash or lesions.  GU: no dysuria, change in color of urine, no urgency or frequency.  No flank pain, no hematuria   MS:   .  No back pain.  Psych:  No change in mood or affect. No depression or anxiety.  No memory loss.         Objective:   Physical Exam GEN: A/Ox3; pleasant , NAD, frail and elderly   HEENT:  [place]/AT,  EACs-clear, TMs-wnl, NOSE-clear, THROAT-clear, no lesions, no postnasal drip or exudate noted.   NECK:  Supple w/ fair ROM; no JVD; normal carotid impulses w/o bruits; no thyromegaly or nodules palpated; no lymphadenopathy.  RESP  Decreaesd BS in bases .no accessory muscle use, no dullness to percussion  CARD:  RRR, no m/r/g  , tr  peripheral edema, pulses intact, no cyanosis or clubbing.  GI:   Soft & nt; nml bowel sounds; no organomegaly or masses detected.  Musco: Warm bil, no deformities or joint swelling noted.   Neuro: alert, no focal deficits noted.    Skin: Warm, no  lesions or rashes         Assessment & Plan:

## 2014-09-26 NOTE — Assessment & Plan Note (Signed)
Intolerant to inhalers , doing well on Nebs  Will continue for now although unclear if Xopenex will be covered  Could consider pulmicort with atrovent if needed on return

## 2014-09-26 NOTE — Assessment & Plan Note (Signed)
Compensated on O2  

## 2014-09-26 NOTE — Patient Instructions (Addendum)
Continue on Ipratropium and Xopenex neb Four times a day  .  Continue on Eliquis . -call if not covered by insurance .  Wear Oxygen 3lm  with walking and At bedtime   Follow up Dr. Lake Bells in 2-3 weeks and As needed   Please contact office for sooner follow up if symptoms do not improve or worsen or seek emergency care

## 2014-09-26 NOTE — Assessment & Plan Note (Signed)
CT chest 2/10 w/ slight progression   Swallow neg for aspiration  Improved w/ abx  Intolerant to steroids previously.   Plan  Continue on Ipratropium and Xopenex neb Four times a day  .  Wear Oxygen 3lm  with walking and At bedtime   Follow up Dr. Lake Bells in 2-3 weeks and As needed   Please contact office for sooner follow up if symptoms do not improve or worsen or seek emergency care

## 2014-09-29 NOTE — Progress Notes (Signed)
I agree with above 

## 2014-10-06 ENCOUNTER — Other Ambulatory Visit: Payer: Self-pay | Admitting: Internal Medicine

## 2014-10-11 ENCOUNTER — Encounter: Payer: Self-pay | Admitting: Pulmonary Disease

## 2014-10-11 ENCOUNTER — Ambulatory Visit (INDEPENDENT_AMBULATORY_CARE_PROVIDER_SITE_OTHER): Payer: Medicare Other | Admitting: Pulmonary Disease

## 2014-10-11 ENCOUNTER — Telehealth: Payer: Self-pay | Admitting: Pulmonary Disease

## 2014-10-11 ENCOUNTER — Telehealth: Payer: Self-pay

## 2014-10-11 ENCOUNTER — Other Ambulatory Visit: Payer: Self-pay | Admitting: Internal Medicine

## 2014-10-11 VITALS — BP 124/62 | HR 83 | Ht 65.0 in | Wt 134.0 lb

## 2014-10-11 DIAGNOSIS — I82409 Acute embolism and thrombosis of unspecified deep veins of unspecified lower extremity: Secondary | ICD-10-CM

## 2014-10-11 DIAGNOSIS — J849 Interstitial pulmonary disease, unspecified: Secondary | ICD-10-CM

## 2014-10-11 DIAGNOSIS — R911 Solitary pulmonary nodule: Secondary | ICD-10-CM

## 2014-10-11 DIAGNOSIS — J432 Centrilobular emphysema: Secondary | ICD-10-CM

## 2014-10-11 MED ORDER — ACLIDINIUM BROMIDE 400 MCG/ACT IN AEPB: 1.0000 | INHALATION_SPRAY | Freq: Two times a day (BID) | RESPIRATORY_TRACT | Status: DC

## 2014-10-11 MED ORDER — APIXABAN 5 MG PO TABS: 5.0000 mg | ORAL_TABLET | Freq: Two times a day (BID) | ORAL | Status: DC

## 2014-10-11 MED ORDER — DILTIAZEM HCL 90 MG PO TABS: 90.0000 mg | ORAL_TABLET | Freq: Four times a day (QID) | ORAL | Status: DC

## 2014-10-11 MED ORDER — PREDNISONE 10 MG PO TABS: ORAL_TABLET | ORAL | Status: DC

## 2014-10-11 MED ORDER — NEBIVOLOL HCL 5 MG PO TABS: 5.0000 mg | ORAL_TABLET | Freq: Every day | ORAL | Status: DC

## 2014-10-11 NOTE — Assessment & Plan Note (Signed)
I had a lengthy conversation with the patient as well as her son today in clinic discussing this pulmonary nodule. I am very concerned that this represents malignancy considering her lengthy smoking history. Further, there is mediastinal lymphadenopathy seen on the CT scan which is worrisome for at least stage II disease. I told her today that I thought that we could successfully complete an endobronchial guided ultrasound biopsy of the mediastinal lymphadenopathy without too much difficulty. She is very much against any sort of biopsy because she says that "I've lived a good life" and she is not really interested in chemotherapy.  She and her 3 sons are going to discuss the situation further.  If she decides to undergo a biopsy then I am happy to schedule an endobronchial ultrasound-guided needle aspiration of her mediastinal lymph nodes.  In the meantime we will plan to have a repeat CT chest in May which would be 3 months after the February 2016 CT chest which I have reviewed with her and her son today in clinic showing 2 pulmonary nodules on the right side, one of which may have increased slightly in size since the last study.

## 2014-10-11 NOTE — Telephone Encounter (Signed)
Message made in error

## 2014-10-11 NOTE — Assessment & Plan Note (Signed)
She has severe COPD which is complicated by concomitant interstitial lung disease. She is not wheezing on exam today. She is benefiting from the nebulized therapies which she uses twice a day.  Plan:  -continue Xopenex and Atrovent twice a day -resume [place]

## 2014-10-11 NOTE — Addendum Note (Signed)
Addended by: Len Blalock on: 10/11/2014 05:07 PM   Modules accepted: Orders, Medications

## 2014-10-11 NOTE — Progress Notes (Signed)
Subjective:    Patient ID: WM FRUCHTER, female    DOB: 09-24-1925, 81 y.o.   MRN: [id number]  Synopsis: GOLD grade D COPD first seen by pulmonary in 2015 04/2014 Echo> normal LVEF, grade 1 DD, normal RV and RVSP 04/23/2014 Spirometry> 56% FEV1 0.84L (45% pred) Started on 2 L oxygen with exertion and each bedtime in 2015  HPI Chief Complaint  Patient presents with  . Follow-up    needs refills of medications.  still SOB with activity.   Rachel Walters returns to clinic today for follow-up of her interstitial lung disease, pulmonary nodule, and severe COPD. She says that her shortness of breath has been fairly well controlled for the last several weeks and she enjoys taking the nebulizers because they seem to help her breathing quite a bit. She continues to use 3 L of oxygen. She was hospitalized recently for an idiopathic DVT. She says that she has been working with physical therapy at home. Recently she walked outside on room air and her O2 saturation dropped to 85%. She continues to use and benefit from 3 L of oxygen continuously at this time. She says that she doesn't really feel too short of breath unless she is exerting herself with walking more than about 5 minutes at a time. She continues to cough up green mucus. She plans to see Dr. Burt Knack with cardiology on March 30. She needs refills on her medications today.   Past Medical History  Diagnosis Date  . Arthritis   . COPD (chronic obstructive pulmonary disease)   . Hypertension   . Headache(784.0)   . Neuromuscular disorder   . Asthmatic bronchitis   . Esophageal reflux 05/19/2012  . Dyspnea 04/23/2014  . ILD (interstitial lung disease) 05/24/2014  . H. pylori infection   . Hiatal hernia   . Presbyesophagus   . Esophageal dysmotility   . Leg DVT (deep venous thromboembolism), acute 09/03/2014     Review of Systems  Constitutional: Positive for fatigue. Negative for fever and chills.  HENT: Negative for postnasal drip, rhinorrhea  and sinus pressure.   Respiratory: Positive for shortness of breath. Negative for cough and wheezing.   Cardiovascular: Negative for chest pain, palpitations and leg swelling.       Objective:   Physical Exam  Filed Vitals:   10/11/14 1207  BP: 124/62  Pulse: 83  Height: 5\' 5"  (1.651 m)  Weight: 134 lb (60.782 kg)  SpO2: 97%  RA  Gen: well appearing, no acute distress HEENT: NCAT, EOMi, OP clear PULM: Crackles in bases bilaterally CV: RRR, no mgr, no JVD AB: BS+, soft, nontender,  Ext: warm, trace edema, no clubbing, no cyanosis Derm: no rash or skin breakdown Neuro: A&Ox4 MAEW   04/2014 >CT chest shows some patchy areas of groundglass attenuation and peripheral subpleural reticulation that has slightly increased from last exam . This could reflect mild nonspecific interstitial pneumonia, there was enlargement of 2 sub-solid nodules in the right lung, the largest measuring 2.3 x 1.3 cm with a central solid component measuring at least 1 cm this was concerning for a possible slow growing malignancy. PET scan 05/28/14 >>The right upper and right lower lobe sub solid pulmonary nodules are not significantly hypermetabolic. Although this is reassuring,low-grade adenocarcinoma remains a possibility. Per consensus criteria, given the size of the soft tissue component of the rightupper lobe nodule, tissue sampling or resection should be considered. If this is not performed, CT followup at approximately 37months suggested. 2. Right parotid deep  hypermetabolism, possibly corresponding to a 104mm nodule. This could represent a lymph node or a parotid neoplasm.      Assessment & Plan:   COPD (chronic obstructive pulmonary disease) She has severe COPD which is complicated by concomitant interstitial lung disease. She is not wheezing on exam today. She is benefiting from the nebulized therapies which she uses twice a day.  Plan:  -continue Xopenex and Atrovent twice a day -resume  [place]   ILD (interstitial lung disease) I believe that this may be related to chronic aspiration. The radiologist has suggested nonspecific interstitial pneumonitis. Considering her age if this is not related to aspiration than the most likely etiology would actually be usual interstitial pneumonitis. However, the CT scan findings were not necessarily consistent with this. The only way to know for certain on be performing open lung biopsy which would be too risky considering the severity of her symptoms and her advanced age.  She did not previously tolerate prednisone but she is asking to try it again because she has been feeling more short of breath recently.  Plan: -Prednisone 30 mg for a month, followed by 20 mg for a month -If she does not tolerate prednisone then go back to Medrol -See gastroenterology for esophageal dysmotility and likely aspiration   Leg DVT (deep venous thromboembolism), acute This was idiopathic and so she needs to continue with lifelong anticoagulation with Eliquis.   Pulmonary nodule, right I had a lengthy conversation with the patient as well as her son today in clinic discussing this pulmonary nodule. I am very concerned that this represents malignancy considering her lengthy smoking history. Further, there is mediastinal lymphadenopathy seen on the CT scan which is worrisome for at least stage II disease. I told her today that I thought that we could successfully complete an endobronchial guided ultrasound biopsy of the mediastinal lymphadenopathy without too much difficulty. She is very much against any sort of biopsy because she says that "I've lived a good life" and she is not really interested in chemotherapy.  She and her 3 sons are going to discuss the situation further.  If she decides to undergo a biopsy then I am happy to schedule an endobronchial ultrasound-guided needle aspiration of her mediastinal lymph nodes.  In the meantime we will plan to  have a repeat CT chest in May which would be 3 months after the February 2016 CT chest which I have reviewed with her and her son today in clinic showing 2 pulmonary nodules on the right side, one of which may have increased slightly in size since the last study.    > 25 minutes spent in consultation with the patient and her son today Updated Medication List Outpatient Encounter Prescriptions as of 10/11/2014  Medication Sig  . acyclovir (ZOVIRAX) 800 MG tablet Take 800 mg by mouth at bedtime.  Marland Kitchen albuterol (PROAIR HFA) 108 (90 BASE) MCG/ACT inhaler Inhale 2 puffs into the lungs every 4 (four) hours as needed for wheezing or shortness of breath.  . ALPRAZolam (XANAX) 0.25 MG tablet Take 1 tablet (0.25 mg total) by mouth 3 (three) times daily as needed for anxiety.  Marland Kitchen apixaban (ELIQUIS) 5 MG TABS tablet Take 1 tablet (5 mg total) by mouth 2 (two) times daily. Start 2/16 at 10 am  . Calcium Carbonate-Vitamin D (CALCIUM 600+D) 600-200 MG-UNIT TABS Take 1 tablet by mouth daily.    . cholecalciferol (VITAMIN D) 1000 UNITS tablet Take 1,000 Units by mouth daily.    Marland Kitchen  diltiazem (CARDIZEM) 90 MG tablet Take 1 tablet (90 mg total) by mouth every 6 (six) hours.  Marland Kitchen ipratropium (ATROVENT) 0.02 % nebulizer solution Take 1.25 mLs (0.25 mg total) by nebulization 4 (four) times daily.  Marland Kitchen levalbuterol (XOPENEX) 0.63 MG/3ML nebulizer solution Take 3 mLs (0.63 mg total) by nebulization 4 (four) times daily.  . nebivolol (BYSTOLIC) 5 MG tablet Take 1 tablet (5 mg total) by mouth daily.  Marland Kitchen omeprazole (PRILOSEC) 20 MG capsule Take 20 mg by mouth.  . pravastatin (PRAVACHOL) 10 MG tablet Take 20 mg by mouth every evening.   . traMADol (ULTRAM) 50 MG tablet Take 50 mg by mouth daily as needed.  . vitamin E 100 UNIT capsule Take 100 Units by mouth daily.    . [DISCONTINUED] apixaban (ELIQUIS) 5 MG TABS tablet Take 1 tablet (5 mg total) by mouth 2 (two) times daily. Start 2/16 at 10 am  . [DISCONTINUED] diltiazem  (CARDIZEM) 90 MG tablet Take 1 tablet (90 mg total) by mouth every 6 (six) hours.  . [DISCONTINUED] nebivolol (BYSTOLIC) 5 MG tablet Take 1 tablet (5 mg total) by mouth daily.  . Aclidinium Bromide (TUDORZA PRESSAIR) 400 MCG/ACT AEPB Inhale 1 puff into the lungs 2 (two) times daily.  . predniSONE (DELTASONE) 10 MG tablet Take 30mg  daily for two weeks, then take 20mg  daily for two weeks, then take 10mg  daily for two weeks  . [DISCONTINUED] amitriptyline (ELAVIL) 100 MG tablet Take 100 mg by mouth daily.  . [DISCONTINUED] oxyCODONE (OXY IR/ROXICODONE) 5 MG immediate release tablet Take 1 tablet (5 mg total) by mouth every 6 (six) hours as needed for moderate pain. (Patient not taking: Reported on 10/11/2014)

## 2014-10-11 NOTE — Assessment & Plan Note (Signed)
I believe that this may be related to chronic aspiration. The radiologist has suggested nonspecific interstitial pneumonitis. Considering her age if this is not related to aspiration than the most likely etiology would actually be usual interstitial pneumonitis. However, the CT scan findings were not necessarily consistent with this. The only way to know for certain on be performing open lung biopsy which would be too risky considering the severity of her symptoms and her advanced age.  She did not previously tolerate prednisone but she is asking to try it again because she has been feeling more short of breath recently.  Plan: -Prednisone 30 mg for a month, followed by 20 mg for a month -If she does not tolerate prednisone then go back to Medrol -See gastroenterology for esophageal dysmotility and likely aspiration

## 2014-10-11 NOTE — Telephone Encounter (Signed)
Opened in error

## 2014-10-11 NOTE — Assessment & Plan Note (Signed)
This was idiopathic and so she needs to continue with lifelong anticoagulation with Eliquis.

## 2014-10-11 NOTE — Patient Instructions (Signed)
Take the prednisone as written Think about having a biopsy and let me know F/u with cardiology and GI medicine WE will see you back in May after the CT scan

## 2014-10-16 ENCOUNTER — Telehealth: Payer: Self-pay | Admitting: Pulmonary Disease

## 2014-10-16 NOTE — Telephone Encounter (Signed)
Samples will be left at front desk for pick up. Pt is aware. Nothing further was needed.

## 2014-10-17 ENCOUNTER — Telehealth: Payer: Self-pay | Admitting: Pulmonary Disease

## 2014-10-17 NOTE — Telephone Encounter (Signed)
Per LC- PA for Eliquis 5 mg bid was initiated  Will forward to [facility] to f/u on  Thanks

## 2014-10-22 ENCOUNTER — Telehealth: Payer: Self-pay | Admitting: *Deleted

## 2014-10-22 ENCOUNTER — Encounter: Payer: Self-pay | Admitting: Internal Medicine

## 2014-10-22 ENCOUNTER — Ambulatory Visit (INDEPENDENT_AMBULATORY_CARE_PROVIDER_SITE_OTHER): Payer: Medicare Other | Admitting: Internal Medicine

## 2014-10-22 VITALS — BP 130/62 | HR 84 | Ht 65.0 in | Wt 134.1 lb

## 2014-10-22 DIAGNOSIS — K224 Dyskinesia of esophagus: Secondary | ICD-10-CM

## 2014-10-22 DIAGNOSIS — R1310 Dysphagia, unspecified: Secondary | ICD-10-CM

## 2014-10-22 NOTE — Progress Notes (Signed)
Rachel Walters 12-28-1925 [id number]  Note: This dictation was prepared with Dragon digital system. Any transcriptional errors that result from this procedure are unintentional. Referred by DR Lake Bells  History of Present Illness: This is a  81 year old white female with dysphagia to solids which she localizes to the left side of her neck. It results in regurgitation and coughing up food occasionally. This happens about twice of 3 times a week. Barium esophagram in December 2015 showed mild-to-moderate esophageal dysmotility with breakup of primary peristalsis and intermittent tertiary contractions. There was no reflux. 13 mm tablet passed freely. There was no evidence of aspiration. We have done an upper endoscopy in October 2013 with esophageal dilatation with complete relief of her dysphagia for several years. She was also H. pylori positive and was treated for it. She has an oxygen-dependent interstitial lung disease  and  Is on Eliquis  for DVT.    Past Medical History  Diagnosis Date  . Arthritis   . COPD (chronic obstructive pulmonary disease)   . Hypertension   . Headache(784.0)   . Neuromuscular disorder   . Asthmatic bronchitis   . Esophageal reflux 05/19/2012  . Dyspnea 04/23/2014  . ILD (interstitial lung disease) 05/24/2014  . H. pylori infection   . Hiatal hernia   . Presbyesophagus   . Esophageal dysmotility   . Leg DVT (deep venous thromboembolism), acute 09/03/2014    Past Surgical History  Procedure Laterality Date  . Neck surgery  1992 and 1993  . Balloon dilation  05/19/2012    Procedure: BALLOON DILATION;  Surgeon: Lafayette Dragon, MD;  Location: [facility] ENDOS[facility];  Service: Endoscopy;  Laterality: N/A;  . Colonoscopy    . Esophagogastroduodenoscopy      Allergies  Allergen Reactions  . Sulfa Antibiotics Other (See Comments)    Skin turned yellow    Family history and social history have been reviewed.  Review of Systems: Solid food dysphagia. Denies hoarseness.  Positive for cough  The remainder of the 10 point ROS is negative except as outlined in the H&P  Physical Exam: General Appearance Well developed, in no distress, normal voice. Eyes  Non icteric  HEENT  Non traumatic, normocephalic  Mouth No lesion, tongue papillated, no cheilosis Neck Supple without adenopathy, thyroid not enlarged, no carotid bruits, no JVD Lungs fine inspiratory rales. No wheezes. She is not using oxygen today COR Normal S1, normal S2, regular rhythm, no murmur, quiet precordium Abdomen soft with palpable loops of the colon. No tenderness. Stool palpable throughout the colon. Rectal not done Extremities  No pedal edema Skin No lesions Neurological Alert and oriented x 3 Psychological Normal mood and affect  Assessment and Plan:   81 year old white female who has intermittent dysphagia to solids which resolved after esophageal dilation with 16 mm dilator in 2013. Barium esophagogram does not show stricture ,she does have definitely a motility disorder. An empiric dilatation may help. We will proceed with upper endoscopy with dilatation, since she had a complete relief previously. We will hold Eliquis prior to the procedure. She will be done using propofol at [facility]. As an outpatient. I have discussed this with Dr. Lake Bells today..  CC Dr Augusto Gamble 10/22/2014

## 2014-10-22 NOTE — Patient Instructions (Addendum)
You have been scheduled for an endoscopy. Please follow written instructions given to you at your visit today. If you use inhalers (even only as needed), please bring them with you on the day of your procedure. Your physician has requested that you go to www.startemmi.com and enter the access code given to you at your visit today. This web site gives a general overview about your procedure. However, you should still follow specific instructions given to you by our office regarding your preparation for the procedure.  CC: Dr Dagmar Hait, Dr Lake Bells

## 2014-10-22 NOTE — Telephone Encounter (Signed)
10/22/2014  RE: Rachel Walters DOB: 1926-06-03 MRN: [id number]  Dear Dr Lake Bells,   We have scheduled the above patient for an endoscopy procedure. Our records show that she is on anticoagulation therapy prescribed by your office.  Please advise as to whether the patient may come off her therapy of Eliquis 2 days prior to the procedure, which is scheduled for 10/28/14. Please route your response to Dixon Boos, CMA  Sincerely,  Dixon Boos

## 2014-10-23 ENCOUNTER — Encounter: Payer: Self-pay | Admitting: Cardiovascular Disease

## 2014-10-23 ENCOUNTER — Ambulatory Visit (INDEPENDENT_AMBULATORY_CARE_PROVIDER_SITE_OTHER): Payer: Medicare Other | Admitting: Cardiovascular Disease

## 2014-10-23 VITALS — BP 160/70 | HR 94 | Ht 65.0 in | Wt 134.1 lb

## 2014-10-23 DIAGNOSIS — I471 Supraventricular tachycardia: Secondary | ICD-10-CM | POA: Diagnosis not present

## 2014-10-23 MED ORDER — FUROSEMIDE 20 MG PO TABS: 20.0000 mg | ORAL_TABLET | ORAL | Status: DC | PRN

## 2014-10-23 NOTE — Patient Instructions (Signed)
Your physician has recommended you make the following change in your medication:   START lasix 20 mg by mouth daily as needed   Your physician wants you to follow-up in:  1 year with Dr. Burt Knack. You will receive a reminder letter in the mail two months in advance. If you don't receive a letter, please call our office to schedule the follow-up appointment.

## 2014-10-23 NOTE — Progress Notes (Signed)
Cardiology Office Note   Date:  10/23/2014   ID:  Rachel Walters, DOB Oct 16, 1925, MRN [id number]  PCP:  Tivis Ringer, MD  Cardiologist:  Sherren Mocha, MD    No chief complaint on file.  History of Present Illness: Rachel Walters is a 81 y.o. female who presents for hospital follow-up. The patient was hospitalized in February with acute on chronic respiratory failure. She was diagnosed with aspiration pneumonitis/COPD exacerbation. Cardiology evaluated her during that hospitalization because of multifocal atrial tachycardia. She was started on diltiazem and bysystolic. The patient was also diagnosed with a left leg DVT and was started on oral anticoagulation with Eliquis. A CT angiogram of the chest was negative for pulmonary embolus.  The patient is doing well. Her breathing has improved. She's had no recent heart palpitations. She denies orthopnea or PND. She does have leg swelling and this is been present now for several months.   Past Medical History  Diagnosis Date  . Arthritis   . COPD (chronic obstructive pulmonary disease)   . Hypertension   . Headache(784.0)   . Neuromuscular disorder   . Asthmatic bronchitis   . Esophageal reflux 05/19/2012  . Dyspnea 04/23/2014  . ILD (interstitial lung disease) 05/24/2014  . H. pylori infection   . Hiatal hernia   . Presbyesophagus   . Esophageal dysmotility   . Leg DVT (deep venous thromboembolism), acute 09/03/2014    Past Surgical History  Procedure Laterality Date  . Neck surgery  1992 and 1993  . Balloon dilation  05/19/2012    Procedure: BALLOON DILATION;  Surgeon: Lafayette Dragon, MD;  Location: [facility];  Service: Endoscopy;  Laterality: N/A;  . Colonoscopy    . Esophagogastroduodenoscopy      Current Outpatient Prescriptions  Medication Sig Dispense Refill  . Aclidinium Bromide (TUDORZA PRESSAIR) 400 MCG/ACT AEPB Inhale 1 puff into the lungs 2 (two) times daily. 1 each 5  . acyclovir (ZOVIRAX) 800 MG tablet  Take 800 mg by mouth at bedtime.    Marland Kitchen albuterol (PROAIR HFA) 108 (90 BASE) MCG/ACT inhaler Inhale 2 puffs into the lungs every 4 (four) hours as needed for wheezing or shortness of breath.    . ALPRAZolam (XANAX) 0.25 MG tablet Take 1 tablet (0.25 mg total) by mouth 3 (three) times daily as needed for anxiety. 30 tablet 0  . apixaban (ELIQUIS) 5 MG TABS tablet Take 1 tablet (5 mg total) by mouth 2 (two) times daily. Start 2/16 at 10 am 60 tablet 5  . Calcium Carbonate-Vitamin D (CALCIUM 600+D) 600-200 MG-UNIT TABS Take 1 tablet by mouth daily.      . cholecalciferol (VITAMIN D) 1000 UNITS tablet Take 1,000 Units by mouth daily.      Marland Kitchen diltiazem (CARDIZEM) 90 MG tablet Take 1 tablet (90 mg total) by mouth every 6 (six) hours. 30 tablet 6  . escitalopram (LEXAPRO) 5 MG tablet Take 5 mg by mouth daily.   6  . fluticasone (FLONASE) 50 MCG/ACT nasal spray Place 2 sprays into both nostrils as needed.   6  . ipratropium (ATROVENT) 0.02 % nebulizer solution Take 1.25 mLs (0.25 mg total) by nebulization 4 (four) times daily. 150 mL 5  . levalbuterol (XOPENEX) 0.63 MG/3ML nebulizer solution Take 3 mLs (0.63 mg total) by nebulization 4 (four) times daily. 360 mL 5  . nebivolol (BYSTOLIC) 5 MG tablet Take 1 tablet (5 mg total) by mouth daily. 30 tablet 6  . omeprazole (PRILOSEC) 20 MG capsule Take  20 mg by mouth.    . pravastatin (PRAVACHOL) 20 MG tablet Take 20 mg by mouth daily.     . predniSONE (DELTASONE) 10 MG tablet Take 30mg  daily for two weeks, then take 20mg  daily for two weeks, then take 10mg  daily for two weeks 150 tablet 0  . traMADol (ULTRAM) 50 MG tablet Take 50 mg by mouth daily as needed.    . vitamin E 100 UNIT capsule Take 100 Units by mouth daily.      . furosemide (LASIX) 20 MG tablet Take 1 tablet (20 mg total) by mouth as needed for edema. 90 tablet 3   No current facility-administered medications for this visit.    Allergies:   Sulfa antibiotics   Social History:  The patient   reports that she quit smoking about 3 years ago. Her smoking use included Cigarettes. She has a 25 pack-year smoking history. She has never used smokeless tobacco. She reports that she uses illicit drugs. She reports that she does not drink alcohol.   Family History:  The patient's  family history includes Breast cancer in her sister; Diabetes in her maternal aunt, mother, and sister.    ROS:  Please see the history of present illness.  All other systems are reviewed and negative.    PHYSICAL EXAM: VS:  BP 160/70 mmHg  Pulse 94  Ht 5\' 5"  (1.651 m)  Wt 134 lb 1.9 oz (60.836 kg)  BMI 22.32 kg/m2 , BMI Body mass index is 22.32 kg/(m^2). GEN: Well nourished, well developed, in no acute distress HEENT: normal Neck: no JVD, no masses. No carotid bruits Cardiac: RRR without murmur or gallop                Respiratory:  Clear, distant breath sounds with prolonged expiration GI: soft, nontender, nondistended, + BS MS: no deformity or atrophy Ext: no pretibial edema, pedal pulses 2+= bilaterally Skin: warm and dry, no rash Neuro:  Strength and sensation are intact Psych: euthymic mood, full affect  EKG:  EKG is not ordered today.  Recent Labs: 04/23/2014: Pro B Natriuretic peptide (BNP) 94.0 09/01/2014: B Natriuretic Peptide 43.4 09/05/2014: Hemoglobin 11.9*; Platelets 196 09/06/2014: BUN 9; Creatinine 0.86; Potassium 3.5; Sodium 137   Lipid Panel  No results found for: CHOL, TRIG, HDL, CHOLHDL, VLDL, LDLCALC, LDLDIRECT    Wt Readings from Last 3 Encounters:  10/23/14 134 lb 1.9 oz (60.836 kg)  10/22/14 134 lb 2 oz (60.839 kg)  10/11/14 134 lb (60.782 kg)     Cardiac Studies Reviewed: 2-D echocardiogram 09/05/2014: Study Conclusions  - Left ventricle: Small underfilled hyperdynamic LV The cavity size was normal. Wall thickness was increased in a pattern of mild LVH. Systolic function was vigorous. The estimated ejection fraction was in the range of 65% to 70%. Wall motion  was normal; there were no regional wall motion abnormalities.  Venous Duplex 09/02/2014: Summary: Findings consistent with acute deep vein thrombosis involving the popliteal and posterior tibial viens of the right lower extremity.  ASSESSMENT AND PLAN: 1.  Multifocal atrial tachycardia. The patient seems to be doing well. Her heart rate is well controlled today. Will continue diltiazem and bysystolic. I will see her back in one year.  2. DVT: Eliquis is very expensive for her. We will try to give her samples.  3. Essential hypertension: I repeated her blood pressure and it is 130/70 on my evaluation. She will continue  4. Edema: Unclear etiology. May be related to stasis/inactivity, medication side effect,  venous insufficiency. No signs of active congestive heart failure. Wrote prescription for Lasix 20 mg as needed.  Current medicines are reviewed with the patient today.  The patient does not have concerns regarding medicines.  The following changes have been made:  See above  Labs/ tests ordered today include:  No orders of the defined types were placed in this encounter.    Disposition:   FU one year  Signed, Sherren Mocha, MD  10/23/2014 10:27 PM    [facility] [address], [place], [place]  [address] Phone: [phone]; Fax: [phone]

## 2014-10-24 ENCOUNTER — Telehealth: Payer: Self-pay | Admitting: Pulmonary Disease

## 2014-10-24 ENCOUNTER — Encounter (HOSPITAL_COMMUNITY): Payer: Self-pay | Admitting: *Deleted

## 2014-10-24 NOTE — Telephone Encounter (Signed)
See telephone note dated 10/24/14. I have left a message on both patient's home and mobile number to call back.

## 2014-10-24 NOTE — Telephone Encounter (Signed)
I have spoken to patient to advise that per Dr Anastasia Pall office, she may hold her Eliquis 2 days prior to procedure. She verbalizes understanding of this.

## 2014-10-24 NOTE — Telephone Encounter (Signed)
Gave verbal to Dottie to have pt to stop Eliquis 2 days prior to procedure. Nothing further was needed.

## 2014-10-24 NOTE — Telephone Encounter (Signed)
Spoke with Rachel Walters at [facility] at the number provided below, was informed that they do not have a PA on file for pt's Eliquis.  She is refaxing a PA form to triage fax #.  Will hold message in triage until this is received.

## 2014-10-24 NOTE — Telephone Encounter (Signed)
Form was recieived. It has been filled out and refaxed to [facility] [employer]. Form has been placed in blue PA folder in triage. Will route to [place] to follow up on.

## 2014-10-27 NOTE — Anesthesia Preprocedure Evaluation (Signed)
Anesthesia Evaluation  Patient identified by MRN, date of birth, ID band Patient awake    Reviewed: Allergy & Precautions, NPO status , Patient's Chart, lab work & pertinent test results  History of Anesthesia Complications (+) PONV and history of anesthetic complications  Airway Mallampati: II  TM Distance: >3 FB Neck ROM: Full    Dental no notable dental hx. (+) Dental Advisory Given   Pulmonary shortness of breath, asthma , COPD COPD inhaler and oxygen dependent, former smoker,  Severe copd with ILD breath sounds clear to auscultation  Pulmonary exam normal       Cardiovascular hypertension, Pt. on medications + Peripheral Vascular Disease and DVT + dysrhythmias Atrial Fibrillation Rhythm:Regular Rate:Normal     Neuro/Psych  Headaches, negative psych ROS   GI/Hepatic Neg liver ROS, hiatal hernia, GERD-  ,  Endo/Other  negative endocrine ROS  Renal/GU negative Renal ROS  negative genitourinary   Musculoskeletal  (+) Arthritis -,   Abdominal   Peds negative pediatric ROS (+)  Hematology negative hematology ROS (+)   Anesthesia Other Findings   Reproductive/Obstetrics negative OB ROS                             Anesthesia Physical Anesthesia Plan  ASA: IV  Anesthesia Plan: MAC   Post-op Pain Management:    Induction: Intravenous  Airway Management Planned: Nasal Cannula  Additional Equipment:   Intra-op Plan:   Post-operative Plan:   Informed Consent: I have reviewed the patients History and Physical, chart, labs and discussed the procedure including the risks, benefits and alternatives for the proposed anesthesia with the patient or authorized representative who has indicated his/her understanding and acceptance.   Dental advisory given  Plan Discussed with: CRNA  Anesthesia Plan Comments:         Anesthesia Quick Evaluation

## 2014-10-27 NOTE — H&P (View-Only) (Signed)
Rachel Walters [birth year] [id number]  Note: This dictation was prepared with Dragon digital system. Any transcriptional errors that result from this procedure are unintentional. Referred by DR Lake Bells  History of Present Illness: This is a  81 year old white female with dysphagia to solids which she localizes to the left side of her neck. It results in regurgitation and coughing up food occasionally. This happens about twice of 3 times a week. Barium esophagram in December 2015 showed mild-to-moderate esophageal dysmotility with breakup of primary peristalsis and intermittent tertiary contractions. There was no reflux. 13 mm tablet passed freely. There was no evidence of aspiration. We have done an upper endoscopy in October 2013 with esophageal dilatation with complete relief of her dysphagia for several years. She was also H. pylori positive and was treated for it. She has an oxygen-dependent interstitial lung disease  and  Is on Eliquis  for DVT.    Past Medical History  Diagnosis Date  . Arthritis   . COPD (chronic obstructive pulmonary disease)   . Hypertension   . Headache(784.0)   . Neuromuscular disorder   . Asthmatic bronchitis   . Esophageal reflux 05/19/2012  . Dyspnea 04/23/2014  . ILD (interstitial lung disease) 05/24/2014  . H. pylori infection   . Hiatal hernia   . Presbyesophagus   . Esophageal dysmotility   . Leg DVT (deep venous thromboembolism), acute 09/03/2014    Past Surgical History  Procedure Laterality Date  . Neck surgery  1992 and 1993  . Balloon dilation  05/19/2012    Procedure: BALLOON DILATION;  Surgeon: Lafayette Dragon, MD;  Location: [facility];  Service: Endoscopy;  Laterality: N/A;  . Colonoscopy    . Esophagogastroduodenoscopy      Allergies  Allergen Reactions  . Sulfa Antibiotics Other (See Comments)    Skin turned yellow    Family history and social history have been reviewed.  Review of Systems: Solid food dysphagia. Denies hoarseness.  Positive for cough  The remainder of the 10 point ROS is negative except as outlined in the H&P  Physical Exam: General Appearance Well developed, in no distress, normal voice. Eyes  Non icteric  HEENT  Non traumatic, normocephalic  Mouth No lesion, tongue papillated, no cheilosis Neck Supple without adenopathy, thyroid not enlarged, no carotid bruits, no JVD Lungs fine inspiratory rales. No wheezes. She is not using oxygen today COR Normal S1, normal S2, regular rhythm, no murmur, quiet precordium Abdomen soft with palpable loops of the colon. No tenderness. Stool palpable throughout the colon. Rectal not done Extremities  No pedal edema Skin No lesions Neurological Alert and oriented x 3 Psychological Normal mood and affect  Assessment and Plan:   81 year old white female who has intermittent dysphagia to solids which resolved after esophageal dilation with 16 mm dilator in 2013. Barium esophagogram does not show stricture ,she does have definitely a motility disorder. An empiric dilatation may help. We will proceed with upper endoscopy with dilatation, since she had a complete relief previously. We will hold Eliquis prior to the procedure. She will be done using propofol at [facility]. As an outpatient. I have discussed this with Dr. Lake Bells today..  CC Dr Augusto Gamble 10/22/2014

## 2014-10-27 NOTE — Interval H&P Note (Signed)
History and Physical Interval Note:  10/27/2014 8:54 PM  Rachel Walters  has presented today for surgery, with the diagnosis of dysphagia  The various methods of treatment have been discussed with the patient and family. After consideration of risks, benefits and other options for treatment, the patient has consented to  Procedure(s): ESOPHAGOGASTRODUODENOSCOPY (EGD) (N/A) SAVORY DILATION (N/A) as a surgical intervention .  The patient's history has been reviewed, patient examined, no change in status, stable for surgery.  I have reviewed the patient's chart and labs.  Questions were answered to the patient's satisfaction.     [name]

## 2014-10-28 ENCOUNTER — Ambulatory Visit (HOSPITAL_COMMUNITY)
Admission: RE | Admit: 2014-10-28 | Discharge: 2014-10-28 | Disposition: A | Discharge status: Home / Self Care | Payer: Medicare Other | Source: Ambulatory Visit | Attending: Internal Medicine | Admitting: Internal Medicine

## 2014-10-28 ENCOUNTER — Ambulatory Visit (HOSPITAL_COMMUNITY): Payer: Medicare Other | Admitting: Anesthesiology

## 2014-10-28 ENCOUNTER — Encounter (HOSPITAL_COMMUNITY): Payer: Self-pay | Admitting: *Deleted

## 2014-10-28 ENCOUNTER — Encounter (HOSPITAL_COMMUNITY)
Admission: RE | Disposition: A | Discharge status: Home / Self Care | Payer: Self-pay | Source: Ambulatory Visit | Attending: Internal Medicine

## 2014-10-28 DIAGNOSIS — R1310 Dysphagia, unspecified: Secondary | ICD-10-CM

## 2014-10-28 DIAGNOSIS — I4891 Unspecified atrial fibrillation: Secondary | ICD-10-CM | POA: Diagnosis not present

## 2014-10-28 DIAGNOSIS — Z87891 Personal history of nicotine dependence: Secondary | ICD-10-CM | POA: Diagnosis not present

## 2014-10-28 DIAGNOSIS — Z86718 Personal history of other venous thrombosis and embolism: Secondary | ICD-10-CM | POA: Diagnosis not present

## 2014-10-28 DIAGNOSIS — Z882 Allergy status to sulfonamides status: Secondary | ICD-10-CM | POA: Insufficient documentation

## 2014-10-28 DIAGNOSIS — I739 Peripheral vascular disease, unspecified: Secondary | ICD-10-CM | POA: Diagnosis not present

## 2014-10-28 DIAGNOSIS — K224 Dyskinesia of esophagus: Secondary | ICD-10-CM | POA: Insufficient documentation

## 2014-10-28 DIAGNOSIS — J449 Chronic obstructive pulmonary disease, unspecified: Secondary | ICD-10-CM | POA: Diagnosis not present

## 2014-10-28 DIAGNOSIS — Z9981 Dependence on supplemental oxygen: Secondary | ICD-10-CM | POA: Insufficient documentation

## 2014-10-28 DIAGNOSIS — K449 Diaphragmatic hernia without obstruction or gangrene: Secondary | ICD-10-CM | POA: Insufficient documentation

## 2014-10-28 DIAGNOSIS — K219 Gastro-esophageal reflux disease without esophagitis: Secondary | ICD-10-CM | POA: Insufficient documentation

## 2014-10-28 DIAGNOSIS — K228 Other specified diseases of esophagus: Secondary | ICD-10-CM | POA: Insufficient documentation

## 2014-10-28 DIAGNOSIS — M1990 Unspecified osteoarthritis, unspecified site: Secondary | ICD-10-CM | POA: Insufficient documentation

## 2014-10-28 DIAGNOSIS — I10 Essential (primary) hypertension: Secondary | ICD-10-CM | POA: Insufficient documentation

## 2014-10-28 HISTORY — DX: Other specified postprocedural states: Z98.890

## 2014-10-28 HISTORY — DX: Dependence on supplemental oxygen: Z99.81

## 2014-10-28 HISTORY — DX: Nausea with vomiting, unspecified: R11.2

## 2014-10-28 HISTORY — DX: Spasmodic torticollis: G24.3

## 2014-10-28 HISTORY — PX: SAVORY DILATION: SHX5439

## 2014-10-28 HISTORY — PX: ESOPHAGOGASTRODUODENOSCOPY: SHX5428

## 2014-10-28 SURGERY — EGD (ESOPHAGOGASTRODUODENOSCOPY)
Anesthesia: Monitor Anesthesia Care

## 2014-10-28 MED ORDER — LACTATED RINGERS IV SOLN
INTRAVENOUS | Status: DC
  Administered 2014-10-28: 11:00:00 via INTRAVENOUS

## 2014-10-28 MED ORDER — LIDOCAINE HCL (CARDIAC) 20 MG/ML IV SOLN
INTRAVENOUS | Status: DC | PRN
  Administered 2014-10-28: 50 mg via INTRAVENOUS

## 2014-10-28 MED ORDER — PROPOFOL 10 MG/ML IV BOLUS
INTRAVENOUS | Status: DC | PRN
  Administered 2014-10-28 (×2): 20 mg via INTRAVENOUS
  Administered 2014-10-28: 80 mg via INTRAVENOUS

## 2014-10-28 MED ORDER — PROPOFOL 10 MG/ML IV BOLUS
INTRAVENOUS | Status: AC
  Filled 2014-10-28: qty 20

## 2014-10-28 MED ORDER — SODIUM CHLORIDE 0.9 % IV SOLN: INTRAVENOUS | Status: DC

## 2014-10-28 MED ORDER — LIDOCAINE HCL (CARDIAC) 20 MG/ML IV SOLN
INTRAVENOUS | Status: AC
  Filled 2014-10-28: qty 5

## 2014-10-28 NOTE — Interval H&P Note (Signed)
History and Physical Interval Note:  10/28/2014 11:28 AM  Rachel Walters  has presented today for surgery, with the diagnosis of dysphagia  The various methods of treatment have been discussed with the patient and family. After consideration of risks, benefits and other options for treatment, the patient has consented to  Procedure(s): ESOPHAGOGASTRODUODENOSCOPY (EGD) (N/A) SAVORY DILATION (N/A) as a surgical intervention .  The patient's history has been reviewed, patient examined, no change in status, stable for surgery.  I have reviewed the patient's chart and labs.  Questions were answered to the patient's satisfaction.     [name]

## 2014-10-28 NOTE — Op Note (Signed)
[facility] [place], [address]   ENDOSCOPY PROCEDURE REPORT  PATIENT: Rachel Walters, Rachel Walters  MR#: [id number] BIRTHDATE: October 01, 1925 , 75  yrs. old GENDER: female ENDOSCOPIST: Lafayette Dragon, MD REFERRED BY:  Dr Milton Ferguson PROCEDURE DATE:  10/28/2014 PROCEDURE:  EGD w/ wire guided (savary) dilation ASA CLASS:     Class IV INDICATIONS:  dysphagia and prior endoscopy with dilatation in October 2013 relieve dysphagia for several years, History of H. pylori. MEDICATIONS: Monitored anesthesia care TOPICAL ANESTHETIC: none  DESCRIPTION OF PROCEDURE: After the risks benefits and alternatives of the procedure were thoroughly explained, informed consent was obtained.  The Pentax Gastroscope [id number] endoscope was introduced through the mouth and advanced to the second portion of the duodenum , Without limitations.  The instrument was slowly withdrawn as the mucosa was fully examined.   Esophagus: esophagus was intubated with difficulty because of spasm at the upper  esophageal sphincter[  .esophagus was slightly torturous. Lumen otherwise appeared normal. There were no retained secretions or food. There was some resistance at the distal esophagus at the level of lower esophageal sphincter but there was no stricture. Repeated passages of the endoscope resulted in the mild resistance  Stomach: gastric mucosa appeared normal. Gastric antrum and pyloric outlet was normal. Retroflexion of the endoscope revealed normal fundus and cardia. There was small hiatal hernia which was easily reducible  Duodenum: duodenal bulb and descending duodenum was normal  Savary dilator 12 millimeters and 16 mm passed over a guidewire without fluoroscopic guidance. There was no blood on the dilator there was mild resistance        The scope was then withdrawn from the patient and the procedure completed.  COMPLICATIONS: There were no immediate complications.  ENDOSCOPIC  IMPRESSION:  Presbyesophagus. with spasm of the upper and lower esophageal sphincter Passage of 12 and 16 mm Savary dilators No definite esophageal stricture Small reducible hiatal hernia  RECOMMENDATIONS: resume diet.  Chew carefully antireflux measures Follow-up as needed  REPEAT EXAM:  eSigned:  Lafayette Dragon, MD 10/28/2014 12:08 PM    CC:  PATIENT NAME:  Rachel Walters, Rachel Walters MR#: [id number]

## 2014-10-28 NOTE — Anesthesia Postprocedure Evaluation (Signed)
  Anesthesia Post-op Note  Patient: Rachel Walters  Procedure(s) Performed: Procedure(s) (LRB): ESOPHAGOGASTRODUODENOSCOPY (EGD) (N/A) SAVORY DILATION (N/A)  Patient Location: PACU  Anesthesia Type: MAC  Level of Consciousness: awake and alert   Airway and Oxygen Therapy: Patient Spontanous Breathing  Post-op Pain: mild  Post-op Assessment: Post-op Vital signs reviewed, Patient's Cardiovascular Status Stable, Respiratory Function Stable, Patent Airway and No signs of Nausea or vomiting  Last Vitals:  Filed Vitals:   10/28/14 1212  BP: 147/65  Pulse: 78  Temp:   Resp: 15    Post-op Vital Signs: stable   Complications: No apparent anesthesia complications

## 2014-10-28 NOTE — Transfer of Care (Signed)
Immediate Anesthesia Transfer of Care Note  Patient: Rachel Walters  Procedure(s) Performed: Procedure(s): ESOPHAGOGASTRODUODENOSCOPY (EGD) (N/A) SAVORY DILATION (N/A)  Patient Location: PACU and Endoscopy Unit  Anesthesia Type:MAC  Level of Consciousness: awake, sedated, patient cooperative and responds to stimulation  Airway & Oxygen Therapy: Patient Spontanous Breathing and Patient connected to nasal cannula oxygen  Post-op Assessment: Report given to RN and Post -op Vital signs reviewed and stable  Post vital signs: Reviewed and stable  Last Vitals:  Filed Vitals:   10/28/14 1105  Pulse: 87  Temp: 15.8 C    Complications: No apparent anesthesia complications

## 2014-10-29 ENCOUNTER — Encounter (HOSPITAL_COMMUNITY): Payer: Self-pay | Admitting: Internal Medicine

## 2014-11-01 NOTE — Telephone Encounter (Signed)
Called to f/u on PA for Eliquis 5 mg. Eliquis has been approved and claim paid. ID# [id number] Valid 09-27-14 thru 10/27/15.  Spoke to [employer].

## 2014-12-11 ENCOUNTER — Other Ambulatory Visit: Payer: Medicare Other

## 2014-12-16 ENCOUNTER — Other Ambulatory Visit: Payer: Self-pay | Admitting: Orthopedic Surgery

## 2014-12-16 ENCOUNTER — Encounter (HOSPITAL_BASED_OUTPATIENT_CLINIC_OR_DEPARTMENT_OTHER): Payer: Self-pay | Admitting: *Deleted

## 2014-12-16 ENCOUNTER — Ambulatory Visit (INDEPENDENT_AMBULATORY_CARE_PROVIDER_SITE_OTHER)
Admission: RE | Admit: 2014-12-16 | Discharge: 2014-12-16 | Disposition: A | Discharge status: Home / Self Care | Payer: Medicare Other | Source: Ambulatory Visit | Attending: Pulmonary Disease | Admitting: Pulmonary Disease

## 2014-12-16 DIAGNOSIS — J849 Interstitial pulmonary disease, unspecified: Secondary | ICD-10-CM

## 2014-12-16 NOTE — Progress Notes (Signed)
Chart discussed with DR Oletta Lamas concerning constant O2 @3L  use at home, chronic lung status. States anesthesia will evaluate pt on admission to pre-op tomorrow for surgery. Odessa Fleming at Dr Carita Pian office notified.

## 2014-12-17 ENCOUNTER — Encounter (HOSPITAL_BASED_OUTPATIENT_CLINIC_OR_DEPARTMENT_OTHER): Payer: Self-pay | Admitting: Orthopedic Surgery

## 2014-12-17 ENCOUNTER — Other Ambulatory Visit: Payer: Self-pay | Admitting: Pulmonary Disease

## 2014-12-17 ENCOUNTER — Encounter (HOSPITAL_BASED_OUTPATIENT_CLINIC_OR_DEPARTMENT_OTHER)
Admission: RE | Disposition: A | Discharge status: Home / Self Care | Payer: Self-pay | Source: Ambulatory Visit | Attending: Orthopedic Surgery

## 2014-12-17 ENCOUNTER — Ambulatory Visit (HOSPITAL_BASED_OUTPATIENT_CLINIC_OR_DEPARTMENT_OTHER): Payer: Medicare Other | Admitting: Anesthesiology

## 2014-12-17 ENCOUNTER — Ambulatory Visit (HOSPITAL_BASED_OUTPATIENT_CLINIC_OR_DEPARTMENT_OTHER)
Admission: RE | Admit: 2014-12-17 | Discharge: 2014-12-17 | Disposition: A | Discharge status: Home / Self Care | Payer: Medicare Other | Source: Ambulatory Visit | Attending: Orthopedic Surgery | Admitting: Orthopedic Surgery

## 2014-12-17 DIAGNOSIS — M9669 Fracture of other bone following insertion of orthopedic implant, joint prosthesis, or bone plate: Secondary | ICD-10-CM | POA: Insufficient documentation

## 2014-12-17 DIAGNOSIS — Z882 Allergy status to sulfonamides status: Secondary | ICD-10-CM | POA: Insufficient documentation

## 2014-12-17 DIAGNOSIS — J449 Chronic obstructive pulmonary disease, unspecified: Secondary | ICD-10-CM | POA: Diagnosis not present

## 2014-12-17 DIAGNOSIS — J849 Interstitial pulmonary disease, unspecified: Secondary | ICD-10-CM

## 2014-12-17 DIAGNOSIS — Z86718 Personal history of other venous thrombosis and embolism: Secondary | ICD-10-CM | POA: Diagnosis not present

## 2014-12-17 DIAGNOSIS — Z87891 Personal history of nicotine dependence: Secondary | ICD-10-CM | POA: Insufficient documentation

## 2014-12-17 DIAGNOSIS — T84498A Other mechanical complication of other internal orthopedic devices, implants and grafts, initial encounter: Secondary | ICD-10-CM | POA: Diagnosis not present

## 2014-12-17 DIAGNOSIS — K219 Gastro-esophageal reflux disease without esophagitis: Secondary | ICD-10-CM | POA: Diagnosis not present

## 2014-12-17 DIAGNOSIS — K449 Diaphragmatic hernia without obstruction or gangrene: Secondary | ICD-10-CM | POA: Insufficient documentation

## 2014-12-17 DIAGNOSIS — Z886 Allergy status to analgesic agent status: Secondary | ICD-10-CM | POA: Insufficient documentation

## 2014-12-17 DIAGNOSIS — Z9981 Dependence on supplemental oxygen: Secondary | ICD-10-CM | POA: Diagnosis not present

## 2014-12-17 DIAGNOSIS — Y838 Other surgical procedures as the cause of abnormal reaction of the patient, or of later complication, without mention of misadventure at the time of the procedure: Secondary | ICD-10-CM | POA: Insufficient documentation

## 2014-12-17 DIAGNOSIS — Z79899 Other long term (current) drug therapy: Secondary | ICD-10-CM | POA: Diagnosis not present

## 2014-12-17 DIAGNOSIS — I10 Essential (primary) hypertension: Secondary | ICD-10-CM | POA: Diagnosis not present

## 2014-12-17 DIAGNOSIS — M1990 Unspecified osteoarthritis, unspecified site: Secondary | ICD-10-CM | POA: Insufficient documentation

## 2014-12-17 HISTORY — PX: HARDWARE REMOVAL: SHX979

## 2014-12-17 LAB — POCT I-STAT, CHEM 8
BUN: 20 mg/dL (ref 6–20)
CHLORIDE: 95 mmol/L — AB (ref 101–111)
CREATININE: 1 mg/dL (ref 0.44–1.00)
Calcium, Ion: 1.21 mmol/L (ref 1.13–1.30)
Glucose, Bld: 91 mg/dL (ref 65–99)
HCT: 43 % (ref 36.0–46.0)
Hemoglobin: 14.6 g/dL (ref 12.0–15.0)
Potassium: 4.1 mmol/L (ref 3.5–5.1)
SODIUM: 138 mmol/L (ref 135–145)
TCO2: 25 mmol/L (ref 0–100)

## 2014-12-17 SURGERY — REMOVAL, HARDWARE
Anesthesia: General | Site: Elbow | Laterality: Left

## 2014-12-17 MED ORDER — FENTANYL CITRATE (PF) 100 MCG/2ML IJ SOLN
INTRAMUSCULAR | Status: AC
  Filled 2014-12-17: qty 2

## 2014-12-17 MED ORDER — CHLORHEXIDINE GLUCONATE 4 % EX LIQD: 60.0000 mL | Freq: Once | CUTANEOUS | Status: DC

## 2014-12-17 MED ORDER — KETOROLAC TROMETHAMINE 30 MG/ML IJ SOLN
30.0000 mg | Freq: Once | INTRAMUSCULAR | Status: AC | PRN
  Administered 2014-12-17: 15 mg via INTRAVENOUS

## 2014-12-17 MED ORDER — EPHEDRINE SULFATE 50 MG/ML IJ SOLN
INTRAMUSCULAR | Status: DC | PRN
  Administered 2014-12-17: 10 mg via INTRAVENOUS

## 2014-12-17 MED ORDER — HYDROCODONE-ACETAMINOPHEN 5-325 MG PO TABS
1.0000 | ORAL_TABLET | Freq: Four times a day (QID) | ORAL | Status: DC | PRN
  Administered 2014-12-17: 1 via ORAL

## 2014-12-17 MED ORDER — LIDOCAINE HCL (CARDIAC) 20 MG/ML IV SOLN
INTRAVENOUS | Status: DC | PRN
  Administered 2014-12-17: 80 mg via INTRAVENOUS

## 2014-12-17 MED ORDER — FENTANYL CITRATE (PF) 100 MCG/2ML IJ SOLN
INTRAMUSCULAR | Status: DC | PRN
  Administered 2014-12-17: 50 ug via INTRAVENOUS
  Administered 2014-12-17 (×2): 25 ug via INTRAVENOUS

## 2014-12-17 MED ORDER — DOXYCYCLINE HYCLATE 50 MG PO CAPS: 50.0000 mg | ORAL_CAPSULE | Freq: Two times a day (BID) | ORAL | Status: DC

## 2014-12-17 MED ORDER — ONDANSETRON HCL 4 MG/2ML IJ SOLN
INTRAMUSCULAR | Status: DC | PRN
  Administered 2014-12-17: 4 mg via INTRAVENOUS

## 2014-12-17 MED ORDER — CEFAZOLIN SODIUM-DEXTROSE 2-3 GM-% IV SOLR
INTRAVENOUS | Status: AC
  Filled 2014-12-17: qty 50

## 2014-12-17 MED ORDER — CEFAZOLIN SODIUM-DEXTROSE 2-3 GM-% IV SOLR
2.0000 g | INTRAVENOUS | Status: AC
  Administered 2014-12-17: 2 g via INTRAVENOUS

## 2014-12-17 MED ORDER — FENTANYL CITRATE (PF) 100 MCG/2ML IJ SOLN
25.0000 ug | INTRAMUSCULAR | Status: DC | PRN
  Administered 2014-12-17 (×2): 25 ug via INTRAVENOUS

## 2014-12-17 MED ORDER — KETOROLAC TROMETHAMINE 30 MG/ML IJ SOLN
INTRAMUSCULAR | Status: AC
  Filled 2014-12-17: qty 1

## 2014-12-17 MED ORDER — MIDAZOLAM HCL 2 MG/2ML IJ SOLN: 1.0000 mg | INTRAMUSCULAR | Status: DC | PRN

## 2014-12-17 MED ORDER — HYDROCODONE-ACETAMINOPHEN 5-325 MG PO TABS: 1.0000 | ORAL_TABLET | Freq: Four times a day (QID) | ORAL | Status: DC | PRN

## 2014-12-17 MED ORDER — DEXAMETHASONE SODIUM PHOSPHATE 10 MG/ML IJ SOLN
INTRAMUSCULAR | Status: DC | PRN
  Administered 2014-12-17: 4 mg via INTRAVENOUS

## 2014-12-17 MED ORDER — HYDROCODONE-ACETAMINOPHEN 5-325 MG PO TABS
ORAL_TABLET | ORAL | Status: AC
  Filled 2014-12-17: qty 1

## 2014-12-17 MED ORDER — LACTATED RINGERS IV SOLN
INTRAVENOUS | Status: DC
  Administered 2014-12-17: 15:00:00 via INTRAVENOUS

## 2014-12-17 MED ORDER — FENTANYL CITRATE (PF) 100 MCG/2ML IJ SOLN: 50.0000 ug | INTRAMUSCULAR | Status: DC | PRN

## 2014-12-17 MED ORDER — PROPOFOL 10 MG/ML IV BOLUS
INTRAVENOUS | Status: DC | PRN
  Administered 2014-12-17: 100 mg via INTRAVENOUS

## 2014-12-17 MED ORDER — GLYCOPYRROLATE 0.2 MG/ML IJ SOLN: 0.2000 mg | Freq: Once | INTRAMUSCULAR | Status: DC | PRN

## 2014-12-17 MED ORDER — PROMETHAZINE HCL 25 MG/ML IJ SOLN: 6.2500 mg | INTRAMUSCULAR | Status: DC | PRN

## 2014-12-17 SURGICAL SUPPLY — 35 items
BLADE SURG 15 STRL LF DISP TIS (BLADE) ×1 IMPLANT
BLADE SURG 15 STRL SS (BLADE) ×3
BNDG CMPR 9X4 STRL LF SNTH (GAUZE/BANDAGES/DRESSINGS)
BNDG COHESIVE 3X5 TAN STRL LF (GAUZE/BANDAGES/DRESSINGS) ×3 IMPLANT
BNDG ESMARK 4X9 LF (GAUZE/BANDAGES/DRESSINGS) IMPLANT
BNDG GAUZE ELAST 4 BULKY (GAUZE/BANDAGES/DRESSINGS) ×3 IMPLANT
CHLORAPREP W/TINT 26ML (MISCELLANEOUS) ×3 IMPLANT
CORDS BIPOLAR (ELECTRODE) ×3 IMPLANT
COVER BACK TABLE 60X90IN (DRAPES) ×3 IMPLANT
COVER MAYO STAND STRL (DRAPES) ×3 IMPLANT
CUFF TOURNIQUET SINGLE 18IN (TOURNIQUET CUFF) IMPLANT
DECANTER SPIKE VIAL GLASS SM (MISCELLANEOUS) IMPLANT
DRAPE EXTREMITY T 121X128X90 (DRAPE) ×3 IMPLANT
DRAPE OEC MINIVIEW 54X84 (DRAPES) IMPLANT
DRAPE SURG 17X23 STRL (DRAPES) ×3 IMPLANT
GAUZE SPONGE 4X4 12PLY STRL (GAUZE/BANDAGES/DRESSINGS) ×3 IMPLANT
GAUZE XEROFORM 1X8 LF (GAUZE/BANDAGES/DRESSINGS) ×3 IMPLANT
GLOVE BIOGEL PI IND STRL 8.5 (GLOVE) ×1 IMPLANT
GLOVE BIOGEL PI INDICATOR 8.5 (GLOVE) ×2
GLOVE SURG ORTHO 8.0 STRL STRW (GLOVE) ×3 IMPLANT
GOWN STRL REUS W/ TWL LRG LVL3 (GOWN DISPOSABLE) ×1 IMPLANT
GOWN STRL REUS W/TWL LRG LVL3 (GOWN DISPOSABLE) ×3
GOWN STRL REUS W/TWL XL LVL3 (GOWN DISPOSABLE) ×3 IMPLANT
NS IRRIG 1000ML POUR BTL (IV SOLUTION) ×3 IMPLANT
PACK BASIN DAY SURGERY FS (CUSTOM PROCEDURE TRAY) ×3 IMPLANT
PAD CAST 3X4 CTTN HI CHSV (CAST SUPPLIES) ×1 IMPLANT
PADDING CAST COTTON 3X4 STRL (CAST SUPPLIES) ×3
SPLINT PLASTER CAST XFAST 3X15 (CAST SUPPLIES) IMPLANT
SPLINT PLASTER XTRA FASTSET 3X (CAST SUPPLIES)
STOCKINETTE 4X48 STRL (DRAPES) ×3 IMPLANT
SUT CHROMIC 5 0 P 3 (SUTURE) IMPLANT
SUT VICRYL 4-0 PS2 18IN ABS (SUTURE) IMPLANT
SYR BULB 3OZ (MISCELLANEOUS) ×3 IMPLANT
SYR CONTROL 10ML LL (SYRINGE) IMPLANT
UNDERPAD 30X30 (UNDERPADS AND DIAPERS) ×3 IMPLANT

## 2014-12-17 NOTE — Brief Op Note (Signed)
12/17/2014  3:37 PM  PATIENT:  Luanna Salk  81 y.o. female  PRE-OPERATIVE DIAGNOSIS:  STATUS POST OPEN REDUCTION INTERNAL FIXATION LEFT ELBOW  POST-OPERATIVE DIAGNOSIS:  retained metal left elbow  PROCEDURE:  Procedure(s): REMOVAL PLATE/SCREWS LEFT ELBOW (Left)  SURGEON:  Surgeon(s) and Role:    * Daryll Brod, MD - Primary    * Leanora Cover, MD - Assisting  PHYSICIAN ASSISTANT:   ASSISTANTS: K Suhaib Guzzo,MD   ANESTHESIA:   general  EBL:  Total I/O In: 600 [I.V.:600] Out: -   BLOOD ADMINISTERED:none  DRAINS: Penrose drain in the left elbow   LOCAL MEDICATIONS USED:  NONE  SPECIMEN:  Source of Specimen:  cultures  DISPOSITION OF SPECIMEN:  PATHOLOGY  COUNTS:  YES  TOURNIQUET:   Total Tourniquet Time Documented: Upper Arm (Left) - 30 minutes Total: Upper Arm (Left) - 30 minutes   DICTATION: .Other Dictation: Dictation Number [id number]  PLAN OF CARE: Discharge to home after PACU  PATIENT DISPOSITION:  PACU - hemodynamically stable.

## 2014-12-17 NOTE — Op Note (Signed)
Dictation Number [id number]

## 2014-12-17 NOTE — Anesthesia Preprocedure Evaluation (Addendum)
Anesthesia Evaluation  Patient identified by MRN, date of birth, ID band Patient awake    Reviewed: Allergy & Precautions, NPO status , Patient's Chart, lab work & pertinent test results  Airway Mallampati: II  TM Distance: >3 FB Neck ROM: Limited    Dental no notable dental hx.    Pulmonary shortness of breath, asthma , COPD oxygen dependent, former smoker,  breath sounds clear to auscultation  + decreased breath sounds      Cardiovascular hypertension, Pt. on medications Normal cardiovascular examRhythm:Regular Rate:Normal     Neuro/Psych negative neurological ROS  negative psych ROS   GI/Hepatic Neg liver ROS, GERD-  Medicated,  Endo/Other  negative endocrine ROS  Renal/GU negative Renal ROS  negative genitourinary   Musculoskeletal negative musculoskeletal ROS (+)   Abdominal   Peds negative pediatric ROS (+)  Hematology negative hematology ROS (+)   Anesthesia Other Findings   Reproductive/Obstetrics negative OB ROS                           Anesthesia Physical Anesthesia Plan  ASA: III  Anesthesia Plan: General   Post-op Pain Management:    Induction: Intravenous  Airway Management Planned: LMA  Additional Equipment:   Intra-op Plan:   Post-operative Plan: Extubation in OR  Informed Consent: I have reviewed the patients History and Physical, chart, labs and discussed the procedure including the risks, benefits and alternatives for the proposed anesthesia with the patient or authorized representative who has indicated his/her understanding and acceptance.   Dental advisory given  Plan Discussed with: CRNA and Surgeon  Anesthesia Plan Comments:        Anesthesia Quick Evaluation

## 2014-12-17 NOTE — Transfer of Care (Signed)
Immediate Anesthesia Transfer of Care Note  Patient: Rachel Walters  Procedure(s) Performed: Procedure(s): REMOVAL PLATE/SCREWS LEFT ELBOW (Left)  Patient Location: PACU  Anesthesia Type:General  Level of Consciousness: sedated  Airway & Oxygen Therapy: Patient Spontanous Breathing and Patient connected to face mask oxygen  Post-op Assessment: Report given to RN and Post -op Vital signs reviewed and stable  Post vital signs: Reviewed and stable  Last Vitals:  Filed Vitals:   12/17/14 1400  BP: 159/77  Pulse: 90  Temp: 36.8 C  Resp: 20    Complications: No apparent anesthesia complications

## 2014-12-17 NOTE — Anesthesia Postprocedure Evaluation (Signed)
  Anesthesia Post-op Note  Patient: Rachel Walters  Procedure(s) Performed: Procedure(s) (LRB): REMOVAL PLATE/SCREWS LEFT ELBOW (Left)  Patient Location: PACU  Anesthesia Type: General  Level of Consciousness: awake and alert   Airway and Oxygen Therapy: Patient Spontanous Breathing  Post-op Pain: mild  Post-op Assessment: Post-op Vital signs reviewed, Patient's Cardiovascular Status Stable, Respiratory Function Stable, Patent Airway and No signs of Nausea or vomiting  Last Vitals:  Filed Vitals:   12/17/14 1600  BP: 162/80  Pulse: 85  Temp:   Resp: 18    Post-op Vital Signs: stable   Complications: No apparent anesthesia complications

## 2014-12-17 NOTE — H&P (Signed)
Rachel Walters is an 81 year-old right-hand dominant female who I operated on in 2006 when she fell suffering fracture of her left olecranon.  She underwent ORIF with plate and screws, Acumed in type.  She states over the past month she has noticed one of the screws becoming more prominent, over the past month it has come through the skin and has been visible for the past week.  She complains of an intermittent, moderate to severe, stabbing pain, especially if she hits it.   She states it is gradually getting worse.  Activity makes it worse.  Rest makes it better. She has been keeping it covered.   ALLERGIES:     Sulfa  MEDICATIONS:    Calcium +D, vitamins C,D,E, Bystolic, omeprazole, diltiazem, Eliquis, levalbuterol, ipratropium bromide inhalation, tramadol, pravastatin, clorazepate and acyclovir.  SURGICAL HISTORY:    NO other surgeries than noted.  FAMILY MEDICAL HISTORY:     Positive for diabetes, high blood pressure and arthritis.  SOCIAL HISTORY:     She does not smoke or drink.  She is widowed.  REVIEW OF SYSTEMS:   Positive for glasses, high blood pressure, blood clots, TB, otherwise negative 14 points. Rachel Walters is an 81 y.o. female.   Chief Complaint: S/P ORIF left olecranon fracture weth backed out screw HPI: see above  Past Medical History  Diagnosis Date  . Arthritis   . COPD (chronic obstructive pulmonary disease)   . Hypertension   . Neuromuscular disorder   . Asthmatic bronchitis   . Esophageal reflux 05/19/2012  . Dyspnea 04/23/2014  . ILD (interstitial lung disease) 05/24/2014  . H. pylori infection   . Hiatal hernia   . Presbyesophagus   . Esophageal dysmotility   . Headache(784.0)     sinus  . Cervical dystonia   . PONV (postoperative nausea and vomiting)     with neck surgery  . History of home oxygen therapy     uses 3 liters oxygen all the time  . Leg DVT (deep venous thromboembolism), acute 09/03/2014    left leg    Past Surgical History  Procedure  Laterality Date  . Neck surgery  1992 and 1993    spur removed  . Balloon dilation  05/19/2012    Procedure: BALLOON DILATION;  Surgeon: Lafayette Dragon, MD;  Location: [facility];  Service: Endoscopy;  Laterality: N/A;  . Colonoscopy    . Esophagogastroduodenoscopy    . Esophagogastroduodenoscopy N/A 10/28/2014    Procedure: ESOPHAGOGASTRODUODENOSCOPY (EGD);  Surgeon: Lafayette Dragon, MD;  Location: [facility];  Service: Endoscopy;  Laterality: N/A;  . Savory dilation N/A 10/28/2014    Procedure: SAVORY DILATION;  Surgeon: Lafayette Dragon, MD;  Location: [facility];  Service: Endoscopy;  Laterality: N/A;    Family History  Problem Relation Age of Onset  . Diabetes Mother   . Diabetes Sister   . Diabetes Maternal Aunt   . Breast cancer Sister    Social History:  reports that she quit smoking about 3 years ago. Her smoking use included Cigarettes. She has a 25 pack-year smoking history. She has never used smokeless tobacco. She reports that she does not drink alcohol or use illicit drugs.  Allergies:  Allergies  Allergen Reactions  . Sulfa Antibiotics Other (See Comments)    Skin turned yellow  . Morphine And Related Nausea And Vomiting    No prescriptions prior to admission    No results found for this or any previous visit (from the past  48 hour(s)).  Ct Chest Wo Contrast  12/16/2014   CLINICAL DATA:  Followup of pulmonary nodule. History of interstitial lung disease.  EXAM: CT CHEST WITHOUT CONTRAST  TECHNIQUE: Multidetector CT imaging of the chest was performed following the standard protocol without IV contrast.  COMPARISON:  Multiple chest CTs, including back to 09/22/2011.  FINDINGS: Mediastinum/Nodes: Aortic and branch vessel atherosclerosis. Tortuous descending thoracic aorta. Mild cardiomegaly with multivessel coronary artery atherosclerosis.  9 mm low right paratracheal node is similar not pathologic by size criteria. Hilar regions poorly evaluated without intravenous  contrast.  Lungs/Pleura: No pleural fluid.  Secretions within the trachea.  Sub solid right pulmonary nodule measures 2.3 x 1.3 cm on image 15, similar. Similar, 1.6 cm craniocaudal. Similar 8 mm solid component inferiorly on coronal image 53.  Moderate centrilobular emphysema. Right lung base nodularity is felt to be similar.  A right lower lobe subpleural 3 mm nodule on image 35 is unchanged. Spiculated sub solid right lower lobe nodule is unchanged 11 x 8 mm, including on image 33.  Upper abdomen: Normal imaged portions of the liver, stomach, pancreas, gallbladder, adrenal glands. Renal cortical atrophy bilaterally. Old granulomatous disease in the spleen.  Musculoskeletal: Remote left rib trauma. Compression deformities again identified at T9, T12, and L2. Moderate T9 and mild to moderate at T12. Similar. The L2 compression deformity is incompletely imaged.  IMPRESSION: 1. The sub solid right upper lobe pulmonary nodule is not significantly changed. This remains indeterminate. Per consensus criteria, presuming this is not sampled, followup at October of 2016 is recommended. This recommendation follows the consensus statement: Recommendations for the Management of Subsolid Pulmonary Nodules Detected at CT: A Statement from the [place]. Radiology [id number]. 2. A right lower lobe sub solid pulmonary nodule is also similar and can be re-evaluated at followup. 3.  Atherosclerosis, including within the coronary arteries.   Electronically Signed   By: Abigail Miyamoto M.D.   On: 12/16/2014 14:09     Pertinent items are noted in HPI.  Height 5\' 5"  (1.651 m), weight 60.782 kg (134 lb).  General appearance: alert, cooperative and appears stated age Head: Normocephalic, without obvious abnormality Neck: no JVD Resp: clear to auscultation bilaterally Cardio: regular rate and rhythm, S1, S2 normal, no murmur, click, rub or gallop GI: soft, non-tender; bowel sounds normal; no masses,  no  organomegaly Extremities: extremities normal, atraumatic, no cyanosis or edema and exposed screw left elbow Pulses: 2+ and symmetric Skin: Skin color, texture, turgor normal. No rashes or lesions Neurologic: Grossly normal Incision/Wound: Exposed screw  Assessment/Plan RADIOGRAPHS/Diagnosis:     X-rays reveals an olecranon plate with a protruding screw at the proximal transverse portion of the plate.  RECOMMENDATIONS/PLAN:    Plan for removal, this will be scheduled at [facility] surgery.  We will plan on removing the entire plate and screws as an outpatient.  She is aware that she will have to have a splint following   Rachel Walters 12/17/2014, 11:32 AM

## 2014-12-17 NOTE — Discharge Instructions (Signed)

## 2014-12-17 NOTE — Anesthesia Procedure Notes (Signed)
Procedure Name: LMA Insertion Date/Time: 12/17/2014 2:52 PM Performed by: Maryella Shivers Pre-anesthesia Checklist: Patient identified, Emergency Drugs available, Suction available and Patient being monitored Patient Re-evaluated:Patient Re-evaluated prior to inductionOxygen Delivery Method: Circle System Utilized Preoxygenation: Pre-oxygenation with 100% oxygen Intubation Type: IV induction Ventilation: Mask ventilation without difficulty LMA: LMA inserted LMA Size: 4.0 Number of attempts: 1 Airway Equipment and Method: Bite block Placement Confirmation: positive ETCO2 Tube secured with: Tape Dental Injury: Teeth and Oropharynx as per pre-operative assessment

## 2014-12-18 ENCOUNTER — Encounter (HOSPITAL_BASED_OUTPATIENT_CLINIC_OR_DEPARTMENT_OTHER): Payer: Self-pay | Admitting: Orthopedic Surgery

## 2014-12-18 NOTE — Op Note (Signed)
NAMEJAVARIA, Rachel Walters                ACCOUNT NO.:  [id number]  MEDICAL RECORD NO.:  [id number]  LOCATION:                                 FACILITY:  PHYSICIAN:  Daryll Brod, M.D.       DATE OF BIRTH:  09/20/1925  DATE OF PROCEDURE:  12/17/2014 DATE OF DISCHARGE:                              OPERATIVE REPORT   PREOPERATIVE DIAGNOSIS:  Status post open reduction and internal fixation of left elbow with backing out penetration of the skin with a screw.  POSTOPERATIVE DIAGNOSIS:  Status post open reduction and internal fixation of left elbow with backing out penetration of the skin with a screw.  OPERATION:  Removal of internal fixation of left elbow.  SURGEON:  Daryll Brod, M.D.  ASSISTANT:  Leanora Cover, MD  ANESTHESIA:  General.  ANESTHESIOLOGIST:  Dr. Kalman Shan.  HISTORY:  The patient is an 81 year old female, who underwent open reduction and internal fixation of a olecranon fracture of left elbow, 10 years ago.  She has had backing out of one of the screws penetrating through the skin.  She is admitted for removal of plate and screws, all of her left elbow.  Pre, peri, and postoperative course has been discussed along with risks and complications.  She is aware that there is no guarantee with the surgery; possibility of infection; recurrence of injury to arteries, nerves, tendons; incomplete relief of symptoms, dystrophy.  In preoperative area, the patient was seen, the extremity was marked by both the patient and surgeon.  PROCEDURE IN DETAIL:  The patient was brought to the operating room, where a general anesthetic was carried out without difficulty under the direction of Dr. Kalman Shan.  She was prepped using ChloraPrep, supine position, with the left arm free.  A 3-minute dry time was allowed. Time-out taken, confirming the patient and procedure.  The limb was elevated and exsanguinated with an Esmarch bandage.  Tourniquet was placed high on the arm and was inflated to 250 mmHg.   The old incision was used, carried down through subcutaneous tissue, the penetrating screw was removed without difficulty.  Sinus tract was then excise, cultures taken for both aerobic and anaerobic culture including a portion of the sinus tract.  An incision was then made over the plate, this was able to be easily identified.  The 7 remaining screws were removed without difficulty.  The plate was then excised.  A filmy residue around the entire plate was removed with a rongeur along with any bone growth.  The areas of the screws were debrided as much as possible with a curette and irrigated clear, doubled over vessel loop was then placed to the depths of the extent of the plate distally, brought out through the incision proximally.  The deep layers were closed with interrupted 3-0 Vicryl sutures.  Skin with interrupted 4-0 nylon sutures leaving the area of the original screw open and packed where the vessel drains were emanating through.  A sterile compressive dressing, posterior elbow splint were applied.  On deflation of the tourniquet, all fingers immediately pinked.  She was taken to the recovery room for observation in satisfactory condition.  She will be discharged home  to return to the [facility] in 3 days, on doxycycline and Vicodin.          ______________________________ Daryll Brod, M.D.     GK/MEDQ  D:  12/17/2014  T:  12/18/2014  Job:  [id number]

## 2014-12-19 ENCOUNTER — Ambulatory Visit: Payer: Medicare Other | Admitting: Pulmonary Disease

## 2014-12-21 LAB — TISSUE CULTURE: GRAM STAIN: NONE SEEN

## 2014-12-22 LAB — ANAEROBIC CULTURE: Gram Stain: NONE SEEN

## 2014-12-31 ENCOUNTER — Ambulatory Visit: Payer: Medicare Other | Admitting: Pulmonary Disease

## 2015-01-08 ENCOUNTER — Telehealth: Payer: Self-pay | Admitting: Internal Medicine

## 2015-01-08 DIAGNOSIS — R197 Diarrhea, unspecified: Secondary | ICD-10-CM

## 2015-01-08 MED ORDER — METRONIDAZOLE 250 MG PO TABS: 250.0000 mg | ORAL_TABLET | Freq: Three times a day (TID) | ORAL | Status: DC

## 2015-01-08 NOTE — Telephone Encounter (Signed)
Patient given recommendations. She will come for stool kit. Rx sent.

## 2015-01-08 NOTE — Telephone Encounter (Signed)
Please obtain stool for pathogens including C.Diff and stool Lactoferin. Also, please, start Flagyl 250 mg po tid x 10 days, #30, no refill.

## 2015-01-08 NOTE — Telephone Encounter (Signed)
Spoke with patient and she had surgery on her elbow and was given antibiotics to take. She completed these 2 weeks ago. She has stomach problems after this States her PCP told her to eat a bland diet. If she does not eat a bland diet, she has diarrhea. States this has been going on for 10 days now. She had tried eating yogurt but it does not help. Please, advise.

## 2015-01-10 ENCOUNTER — Other Ambulatory Visit: Payer: Medicare Other

## 2015-01-10 ENCOUNTER — Other Ambulatory Visit: Payer: Self-pay | Admitting: Internal Medicine

## 2015-01-10 DIAGNOSIS — R197 Diarrhea, unspecified: Secondary | ICD-10-CM

## 2015-01-11 LAB — FECAL LACTOFERRIN, QUANT: Lactoferrin: NEGATIVE

## 2015-01-17 ENCOUNTER — Telehealth: Payer: Self-pay | Admitting: *Deleted

## 2015-01-17 NOTE — Telephone Encounter (Signed)
Patient called asking for GI pathogen results. Called [facility] and was told they received it Monday and the test is only run on Mondays and Thursdays. It was run Thursday and results should be here on Monday. Patient states she is about to finish the Flagyl that was ordered and wants to know what to do now.  She is still having a mixture of diarrhea and pieces of solid stool when she eats a bland diet.

## 2015-01-17 NOTE — Telephone Encounter (Signed)
Stool results so far negative. Please start  [place] .375 mg po bid and Benefiber 1 tsp daily. Call with an update in 1-2 weeks.

## 2015-01-20 ENCOUNTER — Other Ambulatory Visit: Payer: Self-pay | Admitting: *Deleted

## 2015-01-20 LAB — GASTROINTESTINAL PATHOGEN PANEL PCR
C. DIFFICILE TOX A/B, PCR: NOT DETECTED
Campylobacter, PCR: NOT DETECTED
Cryptosporidium, PCR: NOT DETECTED
E COLI (ETEC) LT/ST, PCR: NOT DETECTED
E COLI 0157, PCR: NOT DETECTED
E coli (STEC) stx1/stx2, PCR: NOT DETECTED
GIARDIA LAMBLIA, PCR: NOT DETECTED
Norovirus, PCR: NOT DETECTED
Rotavirus A, PCR: NOT DETECTED
Salmonella, PCR: NOT DETECTED
Shigella, PCR: NOT DETECTED

## 2015-01-20 MED ORDER — HYOSCYAMINE SULFATE ER 0.375 MG PO TB12: 0.3750 mg | ORAL_TABLET | Freq: Two times a day (BID) | ORAL | Status: DC

## 2015-01-20 NOTE — Telephone Encounter (Signed)
Left a message for patient to call for results. Rx sent.

## 2015-01-20 NOTE — Telephone Encounter (Signed)
Patient given results and recommendations. Rx sent.

## 2015-02-18 ENCOUNTER — Other Ambulatory Visit: Payer: Self-pay | Admitting: Internal Medicine

## 2015-02-20 IMAGING — PT NM PET TUM IMG INITIAL (PI) SKULL BASE T - THIGH
8 series · 25 of 25 positions shown · non-contrast
Comparison: Chest CTs, including 05/17/2014 and 09/22/2011.

CLINICAL DATA: Initial treatment strategy for enlarging pulmonary
nodules..

EXAM:
NUCLEAR MEDICINE PET SKULL BASE TO THIGH
TECHNIQUE: 6.7 mCi F-18 FDG was injected intravenously. Full-ring PET imaging
was performed from the skull base to thigh after the radiotracer. CT
data was obtained and used for attenuation correction and anatomic
localization.
FASTING BLOOD GLUCOSE:  Value: 106 mg/dl

[Series 3: pet sk_thigh ac · axial · 5.0mm · 4.07mm/px · z∈[-359,+473]mm · 4 of 209 slices shown]
[im 1/209]
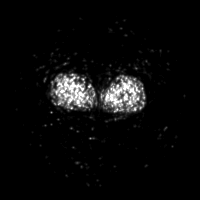
[im 70/209]
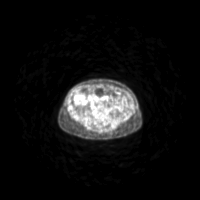
[im 139/209]
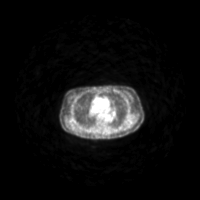
[im 209/209]
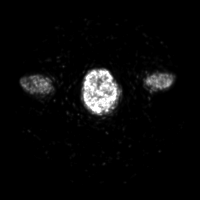

[Series 4: ct sk_thigh 5.0 b31f · axial · 5.0mm · 0.87mm/px · z∈[-359,+473]mm · 5 of 209 slices shown]
[im 1/209]
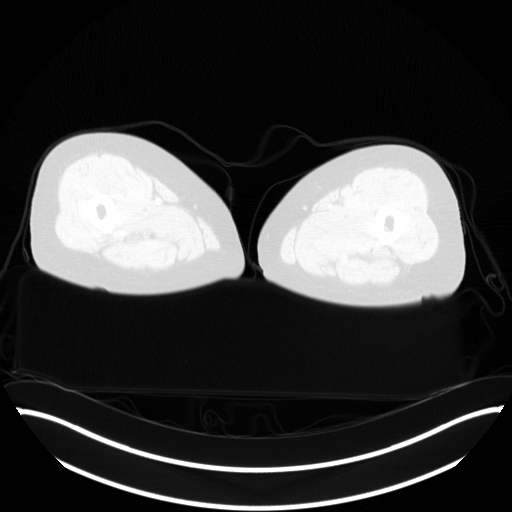
[im 53/209]
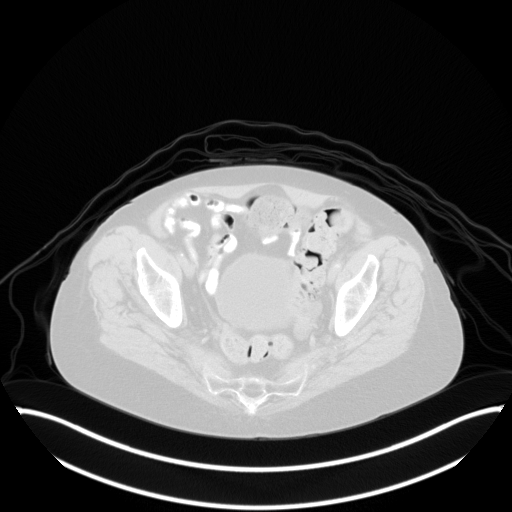
[im 105/209]
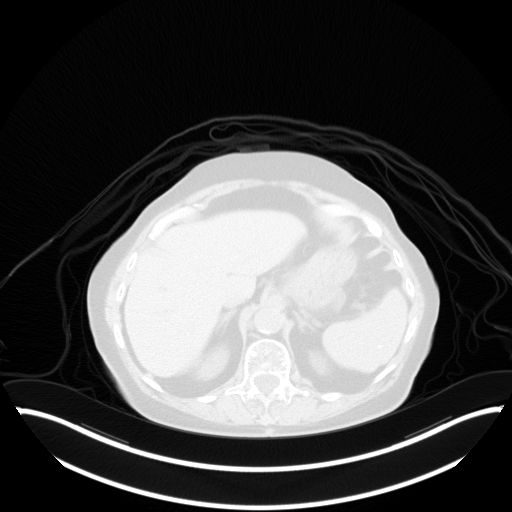
[im 157/209]
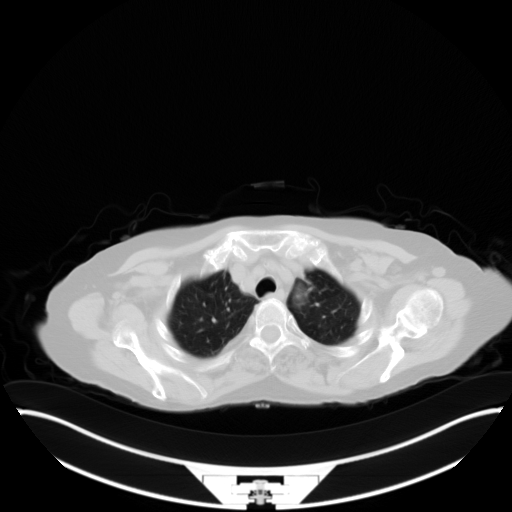
[im 209/209  brain]
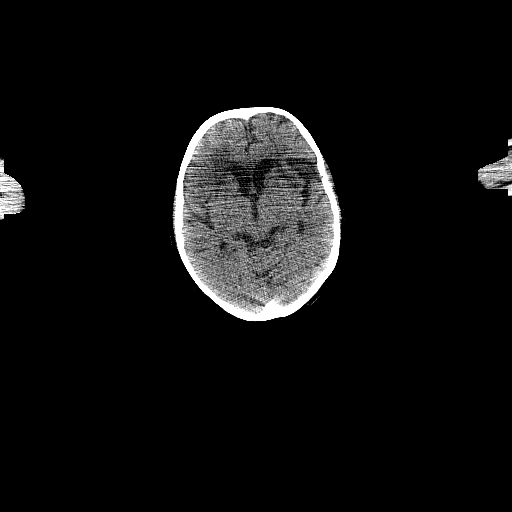

[Series 6: ct sk_thigh 5.0 b70f (id)_bone · axial · 5.0mm · 0.61mm/px · z∈[+63,+335]mm · 2 of 69 slices shown]
[im 1/69  bone]
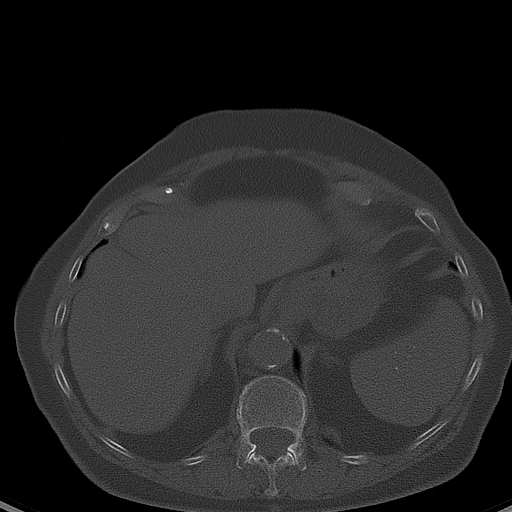
[im 69/69  bone]
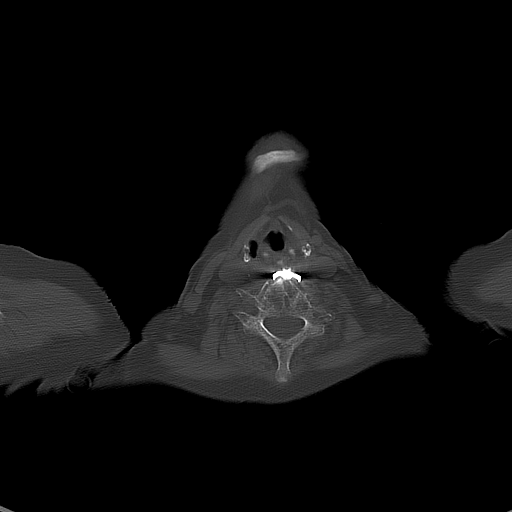

[Series 8: pet sk_thigh nac · axial · 5.0mm · 4.07mm/px · z∈[-359,+473]mm · 5 of 209 slices shown]
[im 1/209]
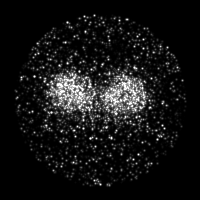
[im 53/209]
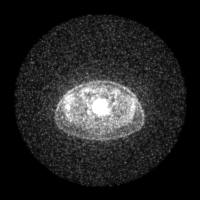
[im 105/209]
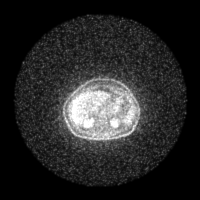
[im 157/209]
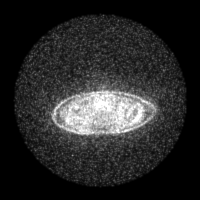
[im 209/209]
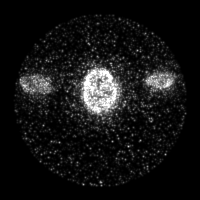

[Series 604: mip collection<mip range> · coronal · 1.73mm/px · 1 of 32 slices shown]
[im 1/32]
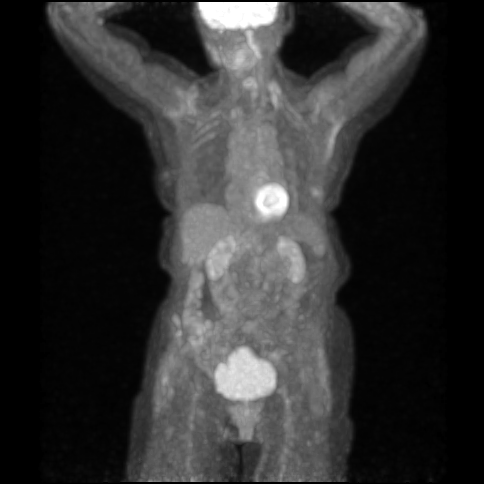

[Series 605: range-ct sk_thigh 5.0 (id)<alpha range> · 2 of 64 slices shown (1 of 2)]
[im 1/64]
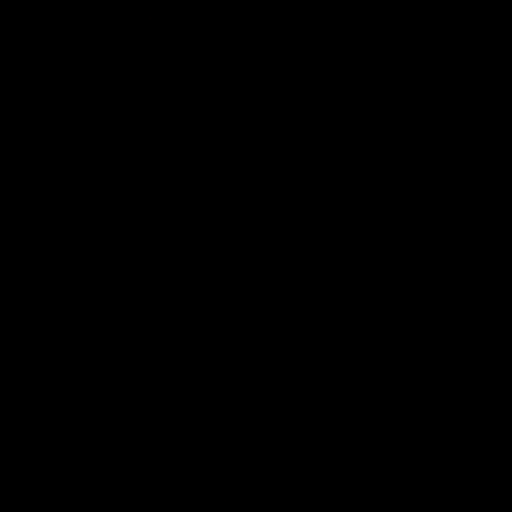
[im 64/64]
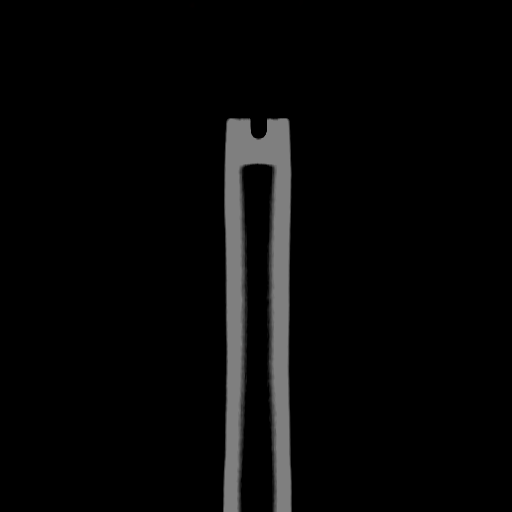

[Series 606: range-ct sk_thigh 5.0 (id)<alpha range> · 5 of 204 slices shown (2 of 2)]
[im 1/204]
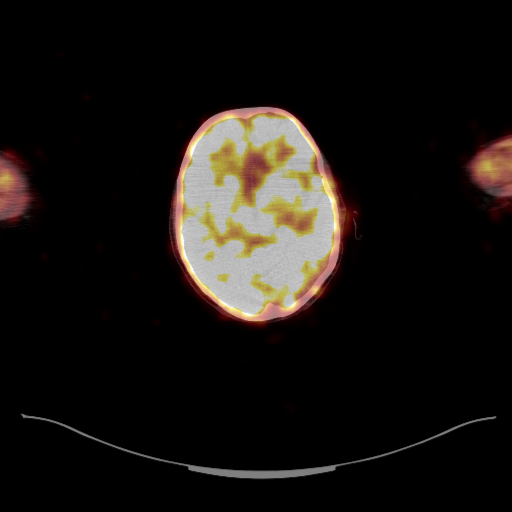
[im 51/204]
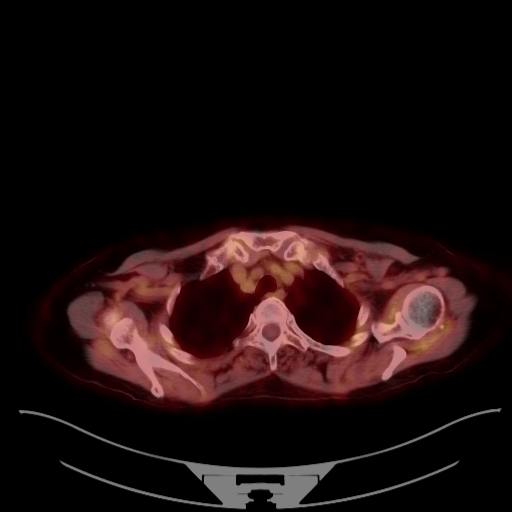
[im 102/204]
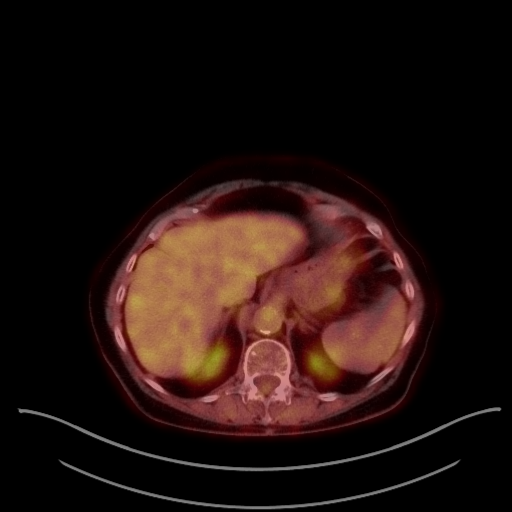
[im 153/204]
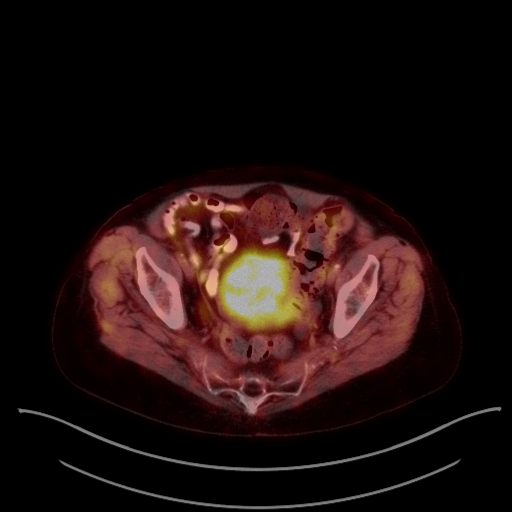
[im 204/204]
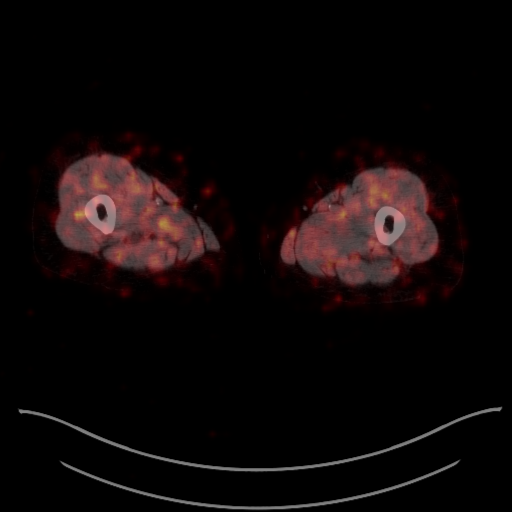

[Series 1034: results mm oncology reading · 4.0mm · 1.05mm/px · 1 of 8 slices shown]
[im 1/8]
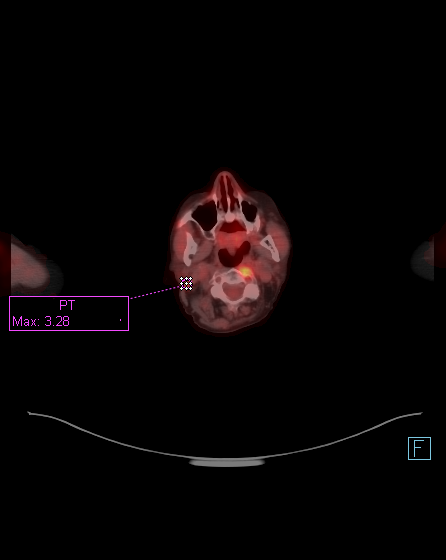

[25 of 25 positions shown; findings below may reference images not displayed]

FINDINGS: NECK

Posterior right parotid hypermetabolism. This may correspond to a 7
mm nodule on image 21 of series 4. This measures a S.U.V. max of
3.3. Left-sided cervical and thoracic muscular activity which is
likely related to motion after radiopharmaceutical injection.

CHEST

Right upper and right lower lobe sub solid pulmonary nodules are
without hypermetabolism. Example subpleural right upper lobe at
x 1.1 cm on image 24 of series 6. Right lower lobe at 9 mm on image
44 of series 6.

ABDOMEN/PELVIS

No areas of abnormal hypermetabolism.

SKELETON

Hypermetabolism about the right iliac wing with concurrent osseous
irregularity. This measures a S.U.V. max of 3.6 on image 146. Likely
posttraumatic or postsurgical.

CT IMAGES PERFORMED FOR ATTENUATION CORRECTION

An L2 compression deformity which is moderate and chronic. Cerebral
atrophy. Carotid atherosclerosis. Cervical spine fixation. Mild
cardiomegaly. Atherosclerosis, including within the coronary
arteries. Tortuous thoracic aorta. Small hiatal hernia. No new
pulmonary opacity. Hyperattenuating interpolar left renal lesion of
6 mm which is technically indeterminate but likely a hemorrhagic
cyst. Normal adrenal glands. Old granulomatous disease in the
spleen. Colonic stool burden suggests constipation. Marked pelvic
floor laxity. Large mouthed right-sided bladder diverticulum.
IMPRESSION: 1. The right upper and right lower lobe sub solid pulmonary nodules
are not significantly hypermetabolic. Although this is reassuring,
low-grade adenocarcinoma remains a possibility. Per consensus
criteria, given the size of the soft tissue component of the right
upper lobe nodule, tissue sampling or resection should be
considered. If this is not performed, CT followup at approximately 6
months suggested.
2. Right parotid deep hypermetabolism, possibly corresponding to a 7
mm nodule. This could represent a lymph node or a parotid neoplasm.
Given lesion size and patient age, this is of questionable clinical
significance. If imaging follow-up is desired, face CT at 6-12
months could be performed.
3. Incidental findings, including pelvic floor laxity, right bladder
diverticulum, probable constipation, and small hiatal hernia.

## 2015-02-27 ENCOUNTER — Ambulatory Visit (INDEPENDENT_AMBULATORY_CARE_PROVIDER_SITE_OTHER): Payer: Medicare Other | Admitting: Pulmonary Disease

## 2015-02-27 ENCOUNTER — Encounter: Payer: Self-pay | Admitting: Pulmonary Disease

## 2015-02-27 VITALS — BP 118/66 | HR 74 | Ht 65.0 in | Wt 119.0 lb

## 2015-02-27 DIAGNOSIS — R911 Solitary pulmonary nodule: Secondary | ICD-10-CM | POA: Diagnosis not present

## 2015-02-27 DIAGNOSIS — J9611 Chronic respiratory failure with hypoxia: Secondary | ICD-10-CM

## 2015-02-27 DIAGNOSIS — J432 Centrilobular emphysema: Secondary | ICD-10-CM

## 2015-02-27 DIAGNOSIS — J849 Interstitial pulmonary disease, unspecified: Secondary | ICD-10-CM

## 2015-02-27 NOTE — Assessment & Plan Note (Signed)
She has crackles in the bases of her lungs and on CT scan she's had patchy groundglass as well as subpleural reticulation. I believe this represents aspiration, but the differential diagnosis includes other forms of interstitial lung disease including usual interstitial pneumonitis. She also has mild to moderate esophageal dysmotility. I asked that she discuss this with her gastroenterologist but unfortunately her gastroenterologist has retired. She says at this time she has no problems swallowing. Further, her breathing has been stable since the last visit.  Plan: Follow-up results of repeat CT scan in November 2016 and then afterwards will decide if she needs to have a referral to a new gastroenterologist

## 2015-02-27 NOTE — Assessment & Plan Note (Signed)
On the most recent CT scan of her chest (May 2016) there had been no interval change in the right upper lobe nodule or mediastinal lymphadenopathy. I still remain concerned about this lesion but she is still quite adamant that she does not want to have it biopsied or do anything about it. However, I believe that knowing if it's growing would be useful information and she concurs with this. Particularly because she's been losing weight for unclear reasons recently.  Plan: Repeat CT chest in November 2016 follow-up with me afterwards

## 2015-02-27 NOTE — Patient Instructions (Signed)
We will schedule a CT scan of your chest in November to follow-up the pulmonary nodule Keep taking the Tudorza twice a day no matter how you feel Stay active, we will see you back in 3 months

## 2015-02-27 NOTE — Progress Notes (Signed)
Subjective:    Patient ID: Rachel Walters, female    DOB: 06/30/1926, 81 y.o.   MRN: [id number]  Synopsis: GOLD grade D COPD first seen by pulmonary in 2015 04/2014 Echo> normal LVEF, grade 1 DD, normal RV and RVSP 04/23/2014 Spirometry> 56% FEV1 0.84L (45% pred) Started on 2 L oxygen with exertion and each bedtime in 2015  HPI Chief Complaint  Patient presents with  . Follow-up    Pt c/o increased sob.  notes prod cough worse qhs with light yellow mucus.     Rachel Walters missed her last appointment with [place] because she had orthopedic surgery recently.  She said that she took antibiotic because the left elbow was infected.  She saw Dr Maurene Capes.but did not have an EGD.  She has not had trouble swallowing. Her breathing has been about the same recently, she says that some days her shortness of breath is worse, other days it's better. She only uses her [place] on days when she feels bad. She's not coughing very much. She has been losing weight and she does not have an appetite.  Past Medical History  Diagnosis Date  . Arthritis   . COPD (chronic obstructive pulmonary disease)   . Hypertension   . Neuromuscular disorder   . Asthmatic bronchitis   . Esophageal reflux 05/19/2012  . Dyspnea 04/23/2014  . ILD (interstitial lung disease) 05/24/2014  . H. pylori infection   . Hiatal hernia   . Presbyesophagus   . Esophageal dysmotility   . Headache(784.0)     sinus  . Cervical dystonia   . PONV (postoperative nausea and vomiting)     with neck surgery  . History of home oxygen therapy     uses 3 liters oxygen all the time  . Leg DVT (deep venous thromboembolism), acute 09/03/2014    left leg     Review of Systems  Constitutional: Positive for fatigue. Negative for fever and chills.  HENT: Negative for postnasal drip, rhinorrhea and sinus pressure.   Respiratory: Positive for shortness of breath. Negative for cough and wheezing.   Cardiovascular: Negative for chest pain, palpitations and  leg swelling.       Objective:   Physical Exam  Filed Vitals:   02/27/15 1346  BP: 118/66  Pulse: 74  Height: 5\' 5"  (1.651 m)  Weight: 119 lb (53.978 kg)  SpO2: 97%  RA  Gen: well appearing, no acute distress HEENT: NCAT, EOMi, OP clear PULM: Crackles in bases bilaterally CV: RRR, no mgr, no JVD AB: BS+, soft, nontender,  Ext: warm, trace edema, no clubbing, no cyanosis Derm: no rash or skin breakdown Neuro: A&Ox4 MAEW  Images from her CT chest in May were reviewed showing a right upper lobe nodule and nonspecific groundglass and reticular changes in the bases of both lungs Notes from her gastroenterology visit in March were reviewed or she was scheduled for an endoscopy but ultimately did not end up having it.  04/2014 >CT chest shows some patchy areas of groundglass attenuation and peripheral subpleural reticulation that has slightly increased from last exam . This could reflect mild nonspecific interstitial pneumonia, there was enlargement of 2 sub-solid nodules in the right lung, the largest measuring 2.3 x 1.3 cm with a central solid component measuring at least 1 cm this was concerning for a possible slow growing malignancy. PET scan 05/28/14 >>The right upper and right lower lobe sub solid pulmonary nodules are not significantly hypermetabolic. Although this is reassuring,low-grade adenocarcinoma remains a  possibility. Per consensus criteria, given the size of the soft tissue component of the rightupper lobe nodule, tissue sampling or resection should be considered. If this is not performed, CT followup at approximately 35months suggested. 2. Right parotid deep hypermetabolism, possibly corresponding to a 80mm nodule. This could represent a lymph node or a parotid neoplasm.      Assessment & Plan:   Pulmonary nodule, right On the most recent CT scan of her chest (May 2016) there had been no interval change in the right upper lobe nodule or mediastinal lymphadenopathy. I still  remain concerned about this lesion but she is still quite adamant that she does not want to have it biopsied or do anything about it. However, I believe that knowing if it's growing would be useful information and she concurs with this. Particularly because she's been losing weight for unclear reasons recently.  Plan: Repeat CT chest in November 2016 follow-up with me afterwards  ILD (interstitial lung disease) She has crackles in the bases of her lungs and on CT scan she's had patchy groundglass as well as subpleural reticulation. I believe this represents aspiration, but the differential diagnosis includes other forms of interstitial lung disease including usual interstitial pneumonitis. She also has mild to moderate esophageal dysmotility. I asked that she discuss this with her gastroenterologist but unfortunately her gastroenterologist has retired. She says at this time she has no problems swallowing. Further, her breathing has been stable since the last visit.  Plan: Follow-up results of repeat CT scan in November 2016 and then afterwards will decide if she needs to have a referral to a new gastroenterologist  COPD (chronic obstructive pulmonary disease) She has severe COPD but this is been a stable interval for her. She does not take her bronchi dilators routinely. Her immunizations are up-to-date.  Plan: Continue using Tudorza twice a day for matter how you feel Albuterol as needed Flu shot in the fall Follow-up in November  Chronic hypoxemic respiratory failure Continue 2 L with exertion and daily at bedtime.    Updated Medication List Outpatient Encounter Prescriptions as of 02/27/2015  Medication Sig  . Aclidinium Bromide (TUDORZA PRESSAIR) 400 MCG/ACT AEPB Inhale 1 puff into the lungs 2 (two) times daily. (Patient taking differently: Inhale 1 puff into the lungs 2 (two) times daily as needed (shortness of breathe.). )  . acyclovir (ZOVIRAX) 800 MG tablet Take 800 mg by mouth at  bedtime.  Marland Kitchen albuterol (PROAIR HFA) 108 (90 BASE) MCG/ACT inhaler Inhale 2 puffs into the lungs every 4 (four) hours as needed for wheezing or shortness of breath.  Marland Kitchen apixaban (ELIQUIS) 5 MG TABS tablet Take 1 tablet (5 mg total) by mouth 2 (two) times daily. Start 2/16 at 10 am  . Calcium Carbonate-Vitamin D (CALCIUM 600+D) 600-200 MG-UNIT TABS Take 1 tablet by mouth every morning.   . diltiazem (CARDIZEM) 90 MG tablet Take 1 tablet (90 mg total) by mouth every 6 (six) hours.  . fluticasone (FLONASE) 50 MCG/ACT nasal spray Place 2 sprays into both nostrils as needed for allergies or rhinitis.   . hyoscyamine (LEVBID) 0.375 MG 12 hr tablet TAKE 1 TABLET (0.375 MG TOTAL) BY MOUTH 2 (TWO) TIMES DAILY.  Marland Kitchen omeprazole (PRILOSEC) 20 MG capsule Take 20 mg by mouth.  . pravastatin (PRAVACHOL) 20 MG tablet Take 20 mg by mouth every morning.   . traMADol (ULTRAM) 50 MG tablet Take 50 mg by mouth 2 (two) times daily as needed for moderate pain.   . [DISCONTINUED]  ALPRAZolam (XANAX) 0.25 MG tablet Take 1 tablet (0.25 mg total) by mouth 3 (three) times daily as needed for anxiety. (Patient not taking: Reported on 02/27/2015)  . [DISCONTINUED] cholecalciferol (VITAMIN D) 1000 UNITS tablet Take 1,000 Units by mouth every morning.   . [DISCONTINUED] doxycycline (VIBRAMYCIN) 50 MG capsule Take 1 capsule (50 mg total) by mouth 2 (two) times daily. (Patient not taking: Reported on 02/27/2015)  . [DISCONTINUED] HYDROcodone-acetaminophen (NORCO) 5-325 MG per tablet Take 1 tablet by mouth every 6 (six) hours as needed for moderate pain. (Patient not taking: Reported on 02/27/2015)  . [DISCONTINUED] hyoscyamine (LEVBID) 0.375 MG 12 hr tablet Take 1 tablet (0.375 mg total) by mouth 2 (two) times daily. (Patient not taking: Reported on 02/27/2015)  . [DISCONTINUED] metroNIDAZOLE (FLAGYL) 250 MG tablet Take 1 tablet (250 mg total) by mouth 3 (three) times daily. (Patient not taking: Reported on 02/27/2015)  . [DISCONTINUED] nebivolol  (BYSTOLIC) 5 MG tablet Take 1 tablet (5 mg total) by mouth daily. (Patient not taking: Reported on 02/27/2015)  . [DISCONTINUED] vitamin E 100 UNIT capsule Take 100 Units by mouth every morning.    No facility-administered encounter medications on file as of 02/27/2015.

## 2015-02-27 NOTE — Addendum Note (Signed)
Addended by: Len Blalock on: 02/27/2015 02:38 PM   Modules accepted: Orders

## 2015-02-27 NOTE — Assessment & Plan Note (Signed)
Continue 2 L with exertion and daily at bedtime 

## 2015-02-27 NOTE — Assessment & Plan Note (Signed)
She has severe COPD but this is been a stable interval for her. She does not take her bronchi dilators routinely. Her immunizations are up-to-date.  Plan: Continue using Tudorza twice a day for matter how you feel Albuterol as needed Flu shot in the fall Follow-up in November

## 2015-04-26 DIAGNOSIS — C801 Malignant (primary) neoplasm, unspecified: Secondary | ICD-10-CM

## 2015-04-26 HISTORY — DX: Malignant (primary) neoplasm, unspecified: C80.1

## 2015-05-20 ENCOUNTER — Ambulatory Visit (INDEPENDENT_AMBULATORY_CARE_PROVIDER_SITE_OTHER)
Admission: RE | Admit: 2015-05-20 | Discharge: 2015-05-20 | Disposition: A | Discharge status: Home / Self Care | Payer: Medicare Other | Source: Ambulatory Visit | Attending: Pulmonary Disease | Admitting: Pulmonary Disease

## 2015-05-20 DIAGNOSIS — J849 Interstitial pulmonary disease, unspecified: Secondary | ICD-10-CM | POA: Diagnosis not present

## 2015-05-20 DIAGNOSIS — R911 Solitary pulmonary nodule: Secondary | ICD-10-CM

## 2015-05-21 ENCOUNTER — Telehealth: Payer: Self-pay | Admitting: Pulmonary Disease

## 2015-05-21 NOTE — Telephone Encounter (Signed)
Result Notes     Notes Recorded by Inge Rise, CMA on 05/21/2015 at 9:19 AM [id number] x1 ------  Notes Recorded by Juanito Doom, MD on 05/21/2015 at 6:37 AM A, Please let her know that her nodules are a little bigger. She and I need to discuss this on the next visit. Thanks B   Pt is aware of results. Has upcoming OV with BQ on 05/29/15. Nothing further was needed.

## 2015-05-23 NOTE — Progress Notes (Signed)
Quick Note:  Contacted pt with results of CT per Dr.Mcquaid  Pt expressed understanding, nothing further needed ______

## 2015-05-29 ENCOUNTER — Ambulatory Visit: Payer: Medicare Other | Admitting: Pulmonary Disease

## 2015-05-30 ENCOUNTER — Emergency Department (HOSPITAL_COMMUNITY)
Admission: EM | Admit: 2015-05-30 | Discharge: 2015-05-30 | Disposition: A | Discharge status: Home / Self Care | Payer: Medicare Other | Attending: Emergency Medicine | Admitting: Emergency Medicine

## 2015-05-30 ENCOUNTER — Encounter (HOSPITAL_COMMUNITY): Payer: Self-pay | Admitting: Emergency Medicine

## 2015-05-30 DIAGNOSIS — I10 Essential (primary) hypertension: Secondary | ICD-10-CM | POA: Insufficient documentation

## 2015-05-30 DIAGNOSIS — Z8619 Personal history of other infectious and parasitic diseases: Secondary | ICD-10-CM | POA: Insufficient documentation

## 2015-05-30 DIAGNOSIS — Z86718 Personal history of other venous thrombosis and embolism: Secondary | ICD-10-CM | POA: Diagnosis not present

## 2015-05-30 DIAGNOSIS — Z79899 Other long term (current) drug therapy: Secondary | ICD-10-CM | POA: Insufficient documentation

## 2015-05-30 DIAGNOSIS — Z87891 Personal history of nicotine dependence: Secondary | ICD-10-CM | POA: Diagnosis not present

## 2015-05-30 DIAGNOSIS — M549 Dorsalgia, unspecified: Secondary | ICD-10-CM | POA: Diagnosis present

## 2015-05-30 DIAGNOSIS — J449 Chronic obstructive pulmonary disease, unspecified: Secondary | ICD-10-CM | POA: Diagnosis not present

## 2015-05-30 DIAGNOSIS — M5489 Other dorsalgia: Secondary | ICD-10-CM

## 2015-05-30 MED ORDER — FENTANYL CITRATE (PF) 100 MCG/2ML IJ SOLN
50.0000 ug | Freq: Once | INTRAMUSCULAR | Status: AC
  Administered 2015-05-30: 50 ug via INTRAVENOUS
  Filled 2015-05-30: qty 2

## 2015-05-30 MED ORDER — ONDANSETRON 4 MG PO TBDP: ORAL_TABLET | ORAL | Status: DC

## 2015-05-30 MED ORDER — OXYCODONE-ACETAMINOPHEN 5-325 MG PO TABS
ORAL_TABLET | ORAL | Status: AC
  Filled 2015-05-30: qty 1

## 2015-05-30 MED ORDER — OXYCODONE-ACETAMINOPHEN 5-325 MG PO TABS
1.0000 | ORAL_TABLET | Freq: Once | ORAL | Status: AC
  Administered 2015-05-30: 1 via ORAL

## 2015-05-30 MED ORDER — HYDROCODONE-ACETAMINOPHEN 5-325 MG PO TABS: 2.0000 | ORAL_TABLET | ORAL | Status: DC | PRN

## 2015-05-30 NOTE — Care Management Note (Signed)
Case Management Note  Patient Details  Name: Rachel Walters MRN: [id number] Date of Birth: 1926/03/06  Subjective/Objective:        Patient presented to [facility] ED with back pain s/p lumbar compression fracture            Action/Plan:  CM received consult concerning recommendation for [facility] services. CM reviewed patient's record, met with patient and son Jaculin Rasmus [phone] at bedside to discuss recommendations for [facility], PT,OT, and HHA, patient and son is amendable with recommendations for disposition. Patient and son presented with list of Home Health agencies. CM explained that services are covered by her health insurance, [facility], Denies any DME needs at this time. Verified information, and  location where services will be rendered. Referral faxed to [facility] [phone] fax confirmation received.  Explained to patient and son, Nurse from [facility] will contact patient at the verified number to arrange initial assessment visit, patient and son verbalized understanding, teach back done. Updated Jaquita Folds PA-C and [facility] RN on discharge plan. No further CM needs identified.  Expected Discharge Date 05/30/2015:                  Expected Discharge Plan:  [place]  In-House Referral:  NA  Discharge planning Services     Post Acute Care Choice:  NA Choice offered to:  Patient, Adult Children  DME Arranged:  N/A DME Agency:     HH Arranged:  RN, PT, Nurse's Aide, OT [place] Agency:  [facility]  Status of Service:  Completed, signed off  Medicare Important Message Given:    Date Medicare IM Given:    Medicare IM give by:    Date Additional Medicare IM Given:    Additional Medicare Important Message give by:     If discussed at [place] of Stay Meetings, dates discussed:    Additional CommentsLaurena Slimmer, RN 05/30/2015, 7:16 PM

## 2015-05-30 NOTE — ED Notes (Signed)
Pt sts lower back pain after having compression fx after fall; pt sts pain meds at home not helping and pt has decreased mobility

## 2015-05-30 NOTE — Discharge Instructions (Signed)
You were evaluated in the ED for your back pain and there does not appear to be an emergent cause for your symptoms at this time. Please follow up with your doctor and neurosurgeon for further evaluation and management of your symptoms. Return to ED for worsening symptoms.  Chronic Back Pain  When back pain lasts longer than 3 months, it is called chronic back pain.People with chronic back pain often go through certain periods that are more intense (flare-ups).  CAUSES Chronic back pain can be caused by wear and tear (degeneration) on different structures in your back. These structures include:  The bones of your spine (vertebrae) and the joints surrounding your spinal cord and nerve roots (facets).  The strong, fibrous tissues that connect your vertebrae (ligaments). Degeneration of these structures may result in pressure on your nerves. This can lead to constant pain. HOME CARE INSTRUCTIONS  Avoid bending, heavy lifting, prolonged sitting, and activities which make the problem worse.  Take brief periods of rest throughout the day to reduce your pain. Lying down or standing usually is better than sitting while you are resting.  Take over-the-counter or prescription medicines only as directed by your caregiver. SEEK IMMEDIATE MEDICAL CARE IF:   You have weakness or numbness in one of your legs or feet.  You have trouble controlling your bladder or bowels.  You have nausea, vomiting, abdominal pain, shortness of breath, or fainting.   This information is not intended to replace advice given to you by your health care provider. Make sure you discuss any questions you have with your health care provider.   Document Released: 08/19/2004 Document Revised: 10/04/2011 Document Reviewed: 12/30/2014 Elsevier Interactive Patient Education [employer].

## 2015-05-30 NOTE — ED Provider Notes (Signed)
CSN: [id number]     Arrival date & time 05/30/15  1434 History   First MD Initiated Contact with Patient 05/30/15 1636     Chief Complaint  Patient presents with  . Back Pain     (Consider location/radiation/quality/duration/timing/severity/associated sxs/prior Treatment) HPI Rachel Walters is a 81 y.o. female who comes in for evaluation of back pain. Patient fell and had CT done on October 2015 showing stable thoracic and lumbar compression fractures. Patient reports falling again on Thursday, October 27, went to the [facility] emergency department and was told she needed to follow up with neurosurgery for possible cementing. Patient felt like her symptoms would resolve on their own and she did not proceed with follow-up. She returns today for worsening back pain. She has taken Percocet at home without relief of her symptoms. She denies any fevers, chills, numbness or weakness. She does reports limited mobility at home, asked to use a walker, but remains somewhat ambulatory.  Past Medical History  Diagnosis Date  . Arthritis   . COPD (chronic obstructive pulmonary disease) ([place])   . Hypertension   . Neuromuscular disorder ([place])   . Asthmatic bronchitis   . Esophageal reflux 05/19/2012  . Dyspnea 04/23/2014  . ILD (interstitial lung disease) ([place]) 05/24/2014  . H. pylori infection   . Hiatal hernia   . Presbyesophagus   . Esophageal dysmotility   . Headache(784.0)     sinus  . Cervical dystonia   . PONV (postoperative nausea and vomiting)     with neck surgery  . History of home oxygen therapy     uses 3 liters oxygen all the time  . Leg DVT (deep venous thromboembolism), acute ([place]) 09/03/2014    left leg   Past Surgical History  Procedure Laterality Date  . Neck surgery  1992 and 1993    spur removed  . Balloon dilation  05/19/2012    Procedure: BALLOON DILATION;  Surgeon: Lafayette Dragon, MD;  Location: [facility] ENDOSCOPY;  Service: Endoscopy;  Laterality: N/A;  . Colonoscopy     . Esophagogastroduodenoscopy    . Esophagogastroduodenoscopy N/A 10/28/2014    Procedure: ESOPHAGOGASTRODUODENOSCOPY (EGD);  Surgeon: Lafayette Dragon, MD;  Location: [facility];  Service: Endoscopy;  Laterality: N/A;  . Savory dilation N/A 10/28/2014    Procedure: SAVORY DILATION;  Surgeon: Lafayette Dragon, MD;  Location: [facility];  Service: Endoscopy;  Laterality: N/A;  . Hardware removal Left 12/17/2014    Procedure: REMOVAL PLATE/SCREWS LEFT ELBOW;  Surgeon: Daryll Brod, MD;  Location: [place];  Service: Orthopedics;  Laterality: Left;   Family History  Problem Relation Age of Onset  . Diabetes Mother   . Diabetes Sister   . Diabetes Maternal Aunt   . Breast cancer Sister    Social History  Substance Use Topics  . Smoking status: Former Smoker -- 0.50 packs/day for 50 years    Types: Cigarettes    Quit date: 07/05/2011  . Smokeless tobacco: Never Used  . Alcohol Use: No   OB History    No data available     Review of Systems A 10 point review of systems was completed and was negative except for pertinent positives and negatives as mentioned in the history of present illness     Allergies  Sulfa antibiotics and Morphine and related  Home Medications   Prior to Admission medications   Medication Sig Start Date End Date Taking? Authorizing Provider  Aclidinium Bromide (TUDORZA PRESSAIR) 400 MCG/ACT AEPB  Inhale 1 puff into the lungs 2 (two) times daily. Patient taking differently: Inhale 1 puff into the lungs 2 (two) times daily as needed (shortness of breathe.).  10/11/14   Juanito Doom, MD  acyclovir (ZOVIRAX) 800 MG tablet Take 800 mg by mouth at bedtime.    Historical Provider, MD  albuterol (PROAIR HFA) 108 (90 BASE) MCG/ACT inhaler Inhale 2 puffs into the lungs every 4 (four) hours as needed for wheezing or shortness of breath. 09/09/14   Shanker Kristeen Mans, MD  apixaban (ELIQUIS) 5 MG TABS tablet Take 1 tablet (5 mg total) by mouth 2 (two) times  daily. Start 2/16 at 10 am 10/11/14   Juanito Doom, MD  Calcium Carbonate-Vitamin D (CALCIUM 600+D) 600-200 MG-UNIT TABS Take 1 tablet by mouth every morning.     Historical Provider, MD  diltiazem (CARDIZEM) 90 MG tablet Take 1 tablet (90 mg total) by mouth every 6 (six) hours. 10/11/14   Juanito Doom, MD  fluticasone (FLONASE) 50 MCG/ACT nasal spray Place 2 sprays into both nostrils as needed for allergies or rhinitis.  10/01/14   Historical Provider, MD  HYDROcodone-acetaminophen (NORCO/VICODIN) 5-325 MG tablet Take 2 tablets by mouth every 4 (four) hours as needed. 05/30/15   Comer Locket, PA-C  hyoscyamine (LEVBID) 0.375 MG 12 hr tablet TAKE 1 TABLET (0.375 MG TOTAL) BY MOUTH 2 (TWO) TIMES DAILY. 02/18/15   Lafayette Dragon, MD  omeprazole (PRILOSEC) 20 MG capsule Take 20 mg by mouth.    Historical Provider, MD  ondansetron (ZOFRAN ODT) 4 MG disintegrating tablet 4mg  ODT q4 hours prn nausea/vomit 05/30/15   Comer Locket, PA-C  pravastatin (PRAVACHOL) 20 MG tablet Take 20 mg by mouth every morning.  09/29/14   Historical Provider, MD  traMADol (ULTRAM) 50 MG tablet Take 50 mg by mouth 2 (two) times daily as needed for moderate pain.  09/10/14   Historical Provider, MD   BP 160/85 mmHg  Pulse 96  Temp(Src) 98.3 F (36.8 C) (Oral)  Resp 18  SpO2 99% Physical Exam  Constitutional: She is oriented to person, place, and time. She appears well-developed and well-nourished.  Overall well-appearing Caucasian female  HENT:  Head: Normocephalic and atraumatic.  Mouth/Throat: Oropharynx is clear and moist.  Eyes: Conjunctivae are normal. Pupils are equal, round, and reactive to light. Right eye exhibits no discharge. Left eye exhibits no discharge. No scleral icterus.  Neck: Normal range of motion. Neck supple.  Cardiovascular: Normal rate, regular rhythm, normal heart sounds and intact distal pulses.  Exam reveals no gallop and no friction rub.   No murmur heard. Pulmonary/Chest: Effort  normal and breath sounds normal. No respiratory distress. She has no wheezes. She has no rales.  Abdominal: Soft. There is no tenderness.  Musculoskeletal: Normal range of motion. She exhibits no edema or tenderness.  Neurological: She is alert and oriented to person, place, and time.  Cranial Nerves II-XII grossly intact. Motor strength is 5/5 in all 4 extremities. Sensation is intact to light touch. Distal pulses intact. Moves all extremities without ataxia  Skin: Skin is warm and dry. No rash noted.  Psychiatric: She has a normal mood and affect.  Nursing note and vitals reviewed.   ED Course  Procedures (including critical care time) Labs Review Labs Reviewed - No data to display  Imaging Review No results found. I have personally reviewed and evaluated these images and lab results as part of my medical decision-making.   EKG Interpretation None  Meds given in ED:  Medications  oxyCODONE-acetaminophen (PERCOCET/ROXICET) 5-325 MG per tablet 1 tablet (1 tablet Oral Given 05/30/15 1516)  oxyCODONE-acetaminophen (PERCOCET/ROXICET) 5-325 MG per tablet (  Duplicate 97/9/48 0165)  fentaNYL (SUBLIMAZE) injection 50 mcg (50 mcg Intravenous Given 05/30/15 1713)    Discharge Medication List as of 05/30/2015  6:57 PM    START taking these medications   Details  HYDROcodone-acetaminophen (NORCO/VICODIN) 5-325 MG tablet Take 2 tablets by mouth every 4 (four) hours as needed., Starting 05/30/2015, Until Discontinued, Print    ondansetron (ZOFRAN ODT) 4 MG disintegrating tablet 4mg  ODT q4 hours prn nausea/vomit, Print       Filed Vitals:   05/30/15 1502 05/30/15 1645 05/30/15 1745 05/30/15 1900  BP: 163/84 116/90 133/67 160/85  Pulse: 103 97 84 96  Temp: 98.3 F (36.8 C)     TempSrc: Oral     Resp: 18     SpO2: 100% 100% 99% 99%    MDM  Rachel Walters is a 81 y.o. female with a history of stable thoracic and lumbar compression fractures sustained roughly 2 weeks ago comes in  for evaluation of back pain. Patient denies any new falls or injuries since previous on October 27. She has a nonfocal neuro exam. She does live home alone and is having difficulty ambulating independently. Her pain was treated in the ED with IV fentanyl. Case management was consult at to assist patient with home health needs. Patient otherwise appears well, nontoxic, stable vital signs and is appropriate for discharge to follow-up with her PCP. Patient's daughter and grandson will be staying with her the remainder of the weekend weekend until home health arrived. Discussed follow-up with PCP in the next 2-3 days for reevaluation. Patient also reports that she has a neurosurgeon that she was previously given a referral for. No evidence of other acute or emergent pathology at this time.  Prior to patient discharge, I discussed and reviewed this case with Dr. Jeneen Rinks   Final diagnoses:  Midline back pain, unspecified location        Comer Locket, PA-C 05/31/15 [place], MD 06/09/15 [phone]

## 2015-07-29 ENCOUNTER — Ambulatory Visit (INDEPENDENT_AMBULATORY_CARE_PROVIDER_SITE_OTHER): Payer: Medicare Other | Admitting: Pulmonary Disease

## 2015-07-29 ENCOUNTER — Encounter: Payer: Self-pay | Admitting: Pulmonary Disease

## 2015-07-29 VITALS — BP 128/66 | HR 85 | Ht 65.0 in | Wt 118.0 lb

## 2015-07-29 DIAGNOSIS — R911 Solitary pulmonary nodule: Secondary | ICD-10-CM | POA: Diagnosis not present

## 2015-07-29 DIAGNOSIS — J432 Centrilobular emphysema: Secondary | ICD-10-CM

## 2015-07-29 NOTE — Progress Notes (Signed)
Subjective:    Patient ID: Rachel Walters, female    DOB: 07-Jun-1926, 82 y.o.   MRN: [id number]  Synopsis: GOLD grade D COPD first seen by pulmonary in 2015 04/2014 Echo> normal LVEF, grade 1 DD, normal RV and RVSP 04/23/2014 Spirometry> 56% FEV1 0.84L (45% pred) Started on 2 L oxygen with exertion and each bedtime in 2015  HPI Chief Complaint  Patient presents with  . Follow-up    pt c/o shortness of breath with any exertion-has to pace herself.  denies cough, congestion.  has fallen twice since last ov.     Alnisha says that she has had two falls since the last visit and has been in rehab twice since the last visit. She feels like her shortness of breath is worse than before.  She notes that her heart rate will go up with dressing and she becomes very dyspneic with that. She is living in an ALF now.  She has someone helping her clean. She missed the last appointment she had a fall.   Past Medical History  Diagnosis Date  . Arthritis   . COPD (chronic obstructive pulmonary disease) ([place])   . Hypertension   . Neuromuscular disorder ([place])   . Asthmatic bronchitis   . Esophageal reflux 05/19/2012  . Dyspnea 04/23/2014  . ILD (interstitial lung disease) ([place]) 05/24/2014  . H. pylori infection   . Hiatal hernia   . Presbyesophagus   . Esophageal dysmotility   . Headache(784.0)     sinus  . Cervical dystonia   . PONV (postoperative nausea and vomiting)     with neck surgery  . History of home oxygen therapy     uses 3 liters oxygen all the time  . Leg DVT (deep venous thromboembolism), acute ([place]) 09/03/2014    left leg     Review of Systems  Constitutional: Positive for fatigue. Negative for fever and chills.  HENT: Negative for postnasal drip, rhinorrhea and sinus pressure.   Respiratory: Positive for shortness of breath. Negative for cough and wheezing.   Cardiovascular: Negative for chest pain, palpitations and leg swelling.       Objective:   Physical Exam  Filed  Vitals:   07/29/15 1420  BP: 128/66  Pulse: 85  Height: 5\' 5"  (1.651 m)  Weight: 118 lb (53.524 kg)  SpO2: 93%  RA  Gen: well appearing, no acute distress HEENT: NCAT, EOMi, OP clear PULM: Crackles in bases bilaterally CV: RRR, no mgr, no JVD AB: BS+, soft, nontender,  Ext: warm, trace edema, no clubbing, no cyanosis Derm: no rash or skin breakdown Neuro: A&Ox4 MAEW   04/2015 CT chest  IMPRESSION: 1. The triangular peripheral band of abnormal interstitial accentuation and sub solid density in the right upper lobe has slowly increased over the past 4 years. Likewise the marginally spiculated 1.3 by 0.9 cm right lower lobe nodule has slowly increased over the past 3 years. Low grade adenocarcinoma is not excluded. Given the very slow rate of change along with the patient's age, and depending on comorbidities, periodic observation may be the most sensible course. 2. Coronary, aortic arch, and branch vessel atherosclerotic vascular disease. 3. Borderline paratracheal adenopathy. 4. Stable thoracic and lumbar compression fractures.  Images personally reviewed     Assessment & Plan:   Pulmonary nodule, right I explained to her today that I think she has adenocarcinoma of the lung based on the CT imaging, her prior smoking history and the slow progression over years. In  the last year she's had a significant decline from a functional standpoint and has had 2 separate falls. She remains adamant that she does not want to have a biopsy and at this point considering her overall frailty I think the risks outweigh the benefits. If she changes her mind about this than we can have another conversation but I do not recommend biopsy right now.  COPD (chronic obstructive pulmonary disease) (HCC) Severe disease, worsening overall with profound deconditioning. Her overall prognosis is poor. She had a lot of questions today about hospice and we spent greater than 20 minutes today talking about  this in clinic. She seems to be doing well with her current bronchodilator regimen and I do not recommend any sort of increase. I explained to her today that if her symptoms continue to worsen she may be ready for outpatient hospice but at this point I don't think were quite there. I see no reason to change her bronchodilators as prescribed right now.  She has received very little benefit from LABA.  > 50% of time spent face to face in a 26 minute visit  Updated Medication List Outpatient Encounter Prescriptions as of 07/29/2015  Medication Sig  . Aclidinium Bromide (TUDORZA PRESSAIR) 400 MCG/ACT AEPB Inhale 1 puff into the lungs 2 (two) times daily. (Patient taking differently: Inhale 1 puff into the lungs 2 (two) times daily as needed (shortness of breathe.). )  . acyclovir (ZOVIRAX) 800 MG tablet Take 800 mg by mouth at bedtime.  Marland Kitchen albuterol (PROAIR HFA) 108 (90 BASE) MCG/ACT inhaler Inhale 2 puffs into the lungs every 4 (four) hours as needed for wheezing or shortness of breath.  Marland Kitchen amLODipine (NORVASC) 5 MG tablet Take 5 mg by mouth daily.  . Calcium Carbonate-Vitamin D (CALCIUM 600+D) 600-200 MG-UNIT TABS Take 1 tablet by mouth every morning.   . clorazepate (TRANXENE) 7.5 MG tablet Take 7.5 mg by mouth at bedtime as needed for anxiety.  . cyclobenzaprine (FLEXERIL) 5 MG tablet Take 5 mg by mouth 3 (three) times daily as needed for muscle spasms.  . fluticasone (FLONASE) 50 MCG/ACT nasal spray Place 2 sprays into both nostrils as needed for allergies or rhinitis.   . hyoscyamine (LEVBID) 0.375 MG 12 hr tablet TAKE 1 TABLET (0.375 MG TOTAL) BY MOUTH 2 (TWO) TIMES DAILY.  Marland Kitchen irbesartan (AVAPRO) 150 MG tablet Take 150 mg by mouth daily.  Marland Kitchen lidocaine (LIDODERM) 5 % Place 1 patch onto the skin daily. Remove & Discard patch within 12 hours or as directed by MD  . loratadine (CLARITIN) 10 MG tablet Take 10 mg by mouth daily as needed for allergies.  . metoprolol tartrate (LOPRESSOR) 25 MG tablet Take  25 mg by mouth 2 (two) times daily.  . mirtazapine (REMERON) 15 MG tablet Take 7.5 mg by mouth at bedtime.  . Multiple Vitamin (MULTIVITAMIN WITH MINERALS) TABS tablet Take 1 tablet by mouth daily.  . naproxen (NAPROSYN) 500 MG tablet Take 500 mg by mouth 2 (two) times daily with a meal.  . oxyCODONE-acetaminophen (PERCOCET/ROXICET) 5-325 MG tablet Take 1 tablet by mouth every 4 (four) hours as needed for severe pain.  . polyethylene glycol (MIRALAX / GLYCOLAX) packet Take 17 g by mouth daily.  . pravastatin (PRAVACHOL) 20 MG tablet Take 20 mg by mouth every morning.   . traMADol (ULTRAM) 50 MG tablet Take 50 mg by mouth 2 (two) times daily as needed for moderate pain.   . [DISCONTINUED] apixaban (ELIQUIS) 5 MG TABS tablet Take  1 tablet (5 mg total) by mouth 2 (two) times daily. Start 2/16 at 10 am (Patient not taking: Reported on 07/29/2015)  . [DISCONTINUED] diltiazem (CARDIZEM) 90 MG tablet Take 1 tablet (90 mg total) by mouth every 6 (six) hours. (Patient not taking: Reported on 07/29/2015)  . [DISCONTINUED] HYDROcodone-acetaminophen (NORCO/VICODIN) 5-325 MG tablet Take 2 tablets by mouth every 4 (four) hours as needed. (Patient not taking: Reported on 07/29/2015)  . [DISCONTINUED] omeprazole (PRILOSEC) 20 MG capsule Take 20 mg by mouth. Reported on 07/29/2015  . [DISCONTINUED] ondansetron (ZOFRAN ODT) 4 MG disintegrating tablet 4mg  ODT q4 hours prn nausea/vomit (Patient not taking: Reported on 07/29/2015)   No facility-administered encounter medications on file as of 07/29/2015.

## 2015-07-29 NOTE — Patient Instructions (Signed)
Keep using her oxygen as you're doing Keep taking the Tudorza twice a day no matter how you feel I will see you back in 4 months or sooner if needed

## 2015-07-29 NOTE — Assessment & Plan Note (Addendum)
I explained to her today that I think she has adenocarcinoma of the lung based on the CT imaging, her prior smoking history and the slow progression over years. In the last year she's had a significant decline from a functional standpoint and has had 2 separate falls. She remains adamant that she does not want to have a biopsy and at this point considering her overall frailty I think the risks outweigh the benefits. If she changes her mind about this than we can have another conversation but I do not recommend biopsy right now.

## 2015-07-29 NOTE — Assessment & Plan Note (Signed)
Severe disease, worsening overall with profound deconditioning. Her overall prognosis is poor. She had a lot of questions today about hospice and we spent greater than 20 minutes today talking about this in clinic. She seems to be doing well with her current bronchodilator regimen and I do not recommend any sort of increase. I explained to her today that if her symptoms continue to worsen she may be ready for outpatient hospice but at this point I don't think were quite there. I see no reason to change her bronchodilators as prescribed right now.  She has received very little benefit from LABA.

## 2015-09-29 ENCOUNTER — Ambulatory Visit: Payer: Medicare Other | Admitting: Obstetrics and Gynecology

## 2015-10-02 ENCOUNTER — Encounter: Payer: Self-pay | Admitting: Obstetrics and Gynecology

## 2015-10-02 ENCOUNTER — Ambulatory Visit (INDEPENDENT_AMBULATORY_CARE_PROVIDER_SITE_OTHER): Payer: Medicare Other | Admitting: Obstetrics and Gynecology

## 2015-10-02 VITALS — BP 174/80 | HR 104 | Resp 18 | Wt 117.0 lb

## 2015-10-02 DIAGNOSIS — N952 Postmenopausal atrophic vaginitis: Secondary | ICD-10-CM

## 2015-10-02 DIAGNOSIS — Z124 Encounter for screening for malignant neoplasm of cervix: Secondary | ICD-10-CM | POA: Diagnosis not present

## 2015-10-02 DIAGNOSIS — N813 Complete uterovaginal prolapse: Secondary | ICD-10-CM | POA: Diagnosis not present

## 2015-10-02 DIAGNOSIS — N950 Postmenopausal bleeding: Secondary | ICD-10-CM

## 2015-10-02 NOTE — Patient Instructions (Signed)
Colpocleisis Colpocleisis (colpectomy) is a surgical procedure to partially or completely remove and stitch (suture) the vagina closed, including the opening. A reason for the surgery is to help with problems caused by the falling down (prolapse) of one or more organs. Prolapse could involve the uterus, bladder, or rectum and is often caused by having babies, heavy straining and lifting for a long period of time, previous pelvic surgery, obesity, chronic constipation with straining, and aging. Colpocleisis may be done in women who:  Have stopped menstruating.  Have already had their uterus removed (hysterectomy).  Do not desire to have sexual intercourse.  Have medical problems that might make more advanced surgery very risky. LET [facility] CARE PROVIDER KNOW ABOUT:   Any allergies you have.  All medicines you are taking, including vitamins, herbs, eye drops, creams, and over-the-counter medicines.  Previous problems you or members of your family have had with the use of anesthetics.  Any blood disorders you have.  Previous surgeries you have had.  Medical conditions you have.  Any colds or infections you have had recently. RISKS AND COMPLICATIONS  Generally, this is a safe procedure. However, as with any procedure, complications can occur. Possible complications include:  Injury to surrounding pelvic organs.  Bleeding.  Infection.  Blood clots to the legs or lungs.  Problems with the anesthetic. BEFORE THE PROCEDURE   Ask your health care provider about changing or stopping any regular medicines.  Do not eat or drink anything for 6-8 hours before the procedure. PROCEDURE   An IV tube will be placed in a vein. You will be given one of the following:  A medicine that numbs the area (local anesthetic).  A medicine that makes you sleep (general anesthetic).  A medicine injected into your spine that numbs your body below the waist (spinal anesthetic).  The  protruding organs are reduced back to their normal position.  The top and bottom of the vagina are removed out through the opening of the vagina.  The opening of the vagina is closed using absorbable sutures. These will dissolve in 1-2 months. AFTER THE PROCEDURE   You will go to a recovery room until your blood pressure, pulse, breathing, and temperature (vital signs) are okay. Then you will be transferred to a regular hospital room.  You will still have an IV tube in your vein for about 2 days. You will also have a catheter in your bladder for about 2 days.  You may be given an antibiotic medicine to prevent an infection.  You may be given pain medicine.   This information is not intended to replace advice given to you by your health care provider. Make sure you discuss any questions you have with your health care provider.   Document Released: 10/06/2009 Document Revised: 03/14/2013 Document Reviewed: 01/03/2013 Elsevier Interactive Patient Education 2016 [place] can use coconut oil on your vagina for moisture, or Replens (over the counter vaginal moisturizer)

## 2015-10-02 NOTE — Progress Notes (Addendum)
Patient ID: Rachel Walters, female   DOB: [birth year], 82 y.o.   MRN: [id number] 82 y.o. [id number] WidowedCaucasianF sent for a consultation by Dr Dagmar Hait for vaginal prolapse and postmenopausal bleeding.   The patient has a long term history of vaginal prolapse. It doesn't bother her. She used to use a pessary, but could no longer put it in and out on her own, so she stopped using it. She had some vaginal bleeding about 2 weeks ago, it occurred 2 x in one day. It was enough blood to stain her underwear. She has a sore spot near her groin on the left and thinks that is the source of the bleeding, but she isn't sure.  She does c/o constipation, she is on benifiber, miralax and stool softers, still constipated. In the last week she has had one small, hard BM a day, doesn't feel she empties. She feels she empties her bladder well, but she does report frequent urination. She denies urinary incontinence. She also c/o a yellow vaginal d/c, no irritation.  She is not sexually active.   No LMP recorded. Patient is postmenopausal.          Sexually active: No.  The current method of family planning is post menopausal status.    Exercising: No.   Smoker:  Former smoker   Health Maintenance: Pap:  Years ago History of abnormal Pap:  no MMG:  2013 WNL per patient  Colonoscopy:  2013 Dr. Olevia Perches BMD:   Years ago TDaP:  unsure Gardasil: N/A   reports that she quit smoking about 4 years ago. Her smoking use included Cigarettes. She has a 25 pack-year smoking history. She has never used smokeless tobacco. She reports that she does not drink alcohol or use illicit drugs.  Past Medical History  Diagnosis Date  . Arthritis   . COPD (chronic obstructive pulmonary disease) ([place])   . Hypertension   . Neuromuscular disorder ([place])   . Asthmatic bronchitis   . Esophageal reflux 05/19/2012  . Dyspnea 04/23/2014  . ILD (interstitial lung disease) ([place]) 05/24/2014  . H. pylori infection   . Hiatal hernia   .  Presbyesophagus   . Esophageal dysmotility   . Headache(784.0)     sinus  . Cervical dystonia   . PONV (postoperative nausea and vomiting)     with neck surgery  . History of home oxygen therapy     uses 3 liters oxygen all the time  . Leg DVT (deep venous thromboembolism), acute ([place]) 09/03/2014    left leg  . Cancer ([place]) 04/2015    lung cancer     Past Surgical History  Procedure Laterality Date  . Neck surgery  1992 and 1993    spur removed  . Balloon dilation  05/19/2012    Procedure: BALLOON DILATION;  Surgeon: Lafayette Dragon, MD;  Location: [facility];  Service: Endoscopy;  Laterality: N/A;  . Colonoscopy    . Esophagogastroduodenoscopy    . Esophagogastroduodenoscopy N/A 10/28/2014    Procedure: ESOPHAGOGASTRODUODENOSCOPY (EGD);  Surgeon: Lafayette Dragon, MD;  Location: [facility];  Service: Endoscopy;  Laterality: N/A;  . Savory dilation N/A 10/28/2014    Procedure: SAVORY DILATION;  Surgeon: Lafayette Dragon, MD;  Location: [facility];  Service: Endoscopy;  Laterality: N/A;  . Hardware removal Left 12/17/2014    Procedure: REMOVAL PLATE/SCREWS LEFT ELBOW;  Surgeon: Daryll Brod, MD;  Location: [facility];  Service: Orthopedics;  Laterality: Left;    Current Outpatient Prescriptions  Medication Sig Dispense Refill  . Aclidinium Bromide (TUDORZA PRESSAIR) 400 MCG/ACT AEPB Inhale 1 puff into the lungs 2 (two) times daily. (Patient taking differently: Inhale 1 puff into the lungs 2 (two) times daily as needed (shortness of breathe.). ) 1 each 5  . acyclovir (ZOVIRAX) 800 MG tablet Take 800 mg by mouth at bedtime.    Marland Kitchen albuterol (PROAIR HFA) 108 (90 BASE) MCG/ACT inhaler Inhale 2 puffs into the lungs every 4 (four) hours as needed for wheezing or shortness of breath.    Marland Kitchen amLODipine (NORVASC) 5 MG tablet Take 5 mg by mouth daily.    . Calcium Carbonate-Vitamin D (CALCIUM 600+D) 600-200 MG-UNIT TABS Take 1 tablet by mouth every morning.     . clorazepate (TRANXENE) 7.5  MG tablet Take 7.5 mg by mouth at bedtime as needed for anxiety.    . cyclobenzaprine (FLEXERIL) 5 MG tablet Take 5 mg by mouth 3 (three) times daily as needed for muscle spasms.    . fluticasone (FLONASE) 50 MCG/ACT nasal spray Place 2 sprays into both nostrils as needed for allergies or rhinitis.   6  . hyoscyamine (LEVBID) 0.375 MG 12 hr tablet TAKE 1 TABLET (0.375 MG TOTAL) BY MOUTH 2 (TWO) TIMES DAILY. 60 tablet 2  . irbesartan (AVAPRO) 150 MG tablet Take 150 mg by mouth daily.    Marland Kitchen lidocaine (LIDODERM) 5 % Place 1 patch onto the skin daily. Remove & Discard patch within 12 hours or as directed by MD    . loratadine (CLARITIN) 10 MG tablet Take 10 mg by mouth daily as needed for allergies.    . metoprolol tartrate (LOPRESSOR) 25 MG tablet Take 25 mg by mouth 2 (two) times daily.    . mirtazapine (REMERON) 15 MG tablet Take 7.5 mg by mouth at bedtime.    . Multiple Vitamin (MULTIVITAMIN WITH MINERALS) TABS tablet Take 1 tablet by mouth daily.    . naproxen (NAPROSYN) 500 MG tablet Take 500 mg by mouth 2 (two) times daily with a meal.    . polyethylene glycol (MIRALAX / GLYCOLAX) packet Take 17 g by mouth daily.    . pravastatin (PRAVACHOL) 20 MG tablet Take 20 mg by mouth every morning.     . traMADol (ULTRAM) 50 MG tablet Take 50 mg by mouth 2 (two) times daily as needed for moderate pain.     Marland Kitchen oxyCODONE-acetaminophen (PERCOCET/ROXICET) 5-325 MG tablet Take 1 tablet by mouth every 4 (four) hours as needed for severe pain. Reported on 10/02/2015     No current facility-administered medications for this visit.    Family History  Problem Relation Age of Onset  . Diabetes Mother   . Diabetes Sister   . Diabetes Maternal Aunt   . Breast cancer Sister     Review of Systems  Constitutional: Negative.   HENT: Negative.   Eyes: Negative.   Respiratory: Negative.   Cardiovascular: Negative.   Gastrointestinal: Negative.   Endocrine: Negative.   Genitourinary: Negative.        Vaginal  bleeding/prolaspe   Musculoskeletal: Negative.   Skin: Negative.   Allergic/Immunologic: Negative.   Neurological: Negative.   Psychiatric/Behavioral: Negative.     Exam:   BP 174/80 mmHg  Pulse 104  Resp 18  Wt 117 lb (53.071 kg)  Weight change: @WEIGHTCHANGE @ Height:      Ht Readings from Last 3 Encounters:  07/29/15 5\' 5"  (1.651 m)  02/27/15 5\' 5"  (1.651 m)  12/17/14 5\' 5"  (1.651 m)  General appearance: alert, cooperative and appears stated age Head: Normocephalic, without obvious abnormality, atraumatic Neck: supple, no thyromegaly Heart: regular rate and rhythm Lungs: CTAB Abdomen: soft, non-tender; bowel sounds normal; no masses,  no organomegaly Lungs: CTAB Extremities: normal, atraumatic, no cyanosis Skin: normal color, texture and turgor, no rashes or lesions Lymph: normal cervical supraclavicular and inguinal nodes Neurologic: grossly normal    Pelvic: External genitalia:  no lesions              Urethra:  normal appearing urethra with no masses, tenderness or lesions              Bartholins and Skenes: normal                 Vagina: total procidentia, the left vaginal side wall appears irritated, likely from rubbing externally.          Cervix: stenotic appearing        Bimanual Exam:  Uterus:  prolapsed fourth degree and unable to palpate her uterus              Adnexa: no mass, fullness, tenderness               Rectovaginal: Confirms               Anus:  normal sphincter tone, no lesions  Chaperone was present for exam.  A:  Complete uterovaginal prolapse  Postmenopausal bleeding, suspect from rubbing of vaginal mucosa externally. There is an area that appears irritated, the vagina is very dry and atrophic. Can't r/o uterine bleeding  Likely urinary retention  Vaginal atrophy  Multiple medical problems, including: COPD, HTN, lung cancer, esophageal issues, and a h/o a DVT  P:   Pap from cx  Return for an ultrasound to check endometrial stripe and  for urinary retention  Discussed using coconut oil or Replens vaginal moisturizer to her vaginal mucosa (very dry)  If she doesn't have urinary retention, discussed the options of doing nothing, pessary or colpocleisis (closing the vagina)  If she has significant urinary retention, she should at minimum use a pessary  We discussed that using a pessary or colpocleisis could reveal stress incontinence  Discussed that urinary retention can lead to kidney disease   Discussed that colpocleisis can be done as an outpatient procedure with local and sedation (would need approval of her Pulmonologist, Primary and Anesthesia).    CC: Dr Dagmar Hait, Dr [place] Letter sent  Addendum:  She has severe baseline dyspnea and she and I discussed hospice for this on the last visit. If sedation is used I would be very cautious and would avoid narcotics as much as possible so as not to suppress her respiratory drive.     ----- Message -----     From: Salvadore Dom, MD     Sent: 10/02/2015  5:49 PM      To: Juanito Doom, MD        Ezequiel Kayser,    Ms Fedorchak has complete uterovaginal prolapse and I suspect urinary retention. I'm still evaluating her for postmenopausal bleeding and for suspected retention. Please see attached note.     If she doesn't tolerate a pessary, I was discussing the possibility of colpocleisis with her. It is potentially an outpatient procedure that can be done with local and sedation. Do you think that is something she would be able to tolerate?    Thanks for your help.    Sharee Pimple

## 2015-10-06 ENCOUNTER — Telehealth: Payer: Self-pay | Admitting: Obstetrics and Gynecology

## 2015-10-06 LAB — IPS PAP TEST WITH REFLEX TO HPV

## 2015-10-06 NOTE — Telephone Encounter (Signed)
Called patient to review benefits for a recommended procedure. Left Voicemail requesting a call back. °

## 2015-10-14 NOTE — Telephone Encounter (Signed)
This patient indicated on the 10/13/15 reminder call for her 10/16/15 PUS appointment that she wanted to cancel. I did not cancel the appointments due to not actually talking to the patient. I left the patient a message to please call our office regarding this appointment.

## 2015-10-16 ENCOUNTER — Other Ambulatory Visit: Payer: Medicare Other

## 2015-10-16 ENCOUNTER — Other Ambulatory Visit: Payer: Medicare Other | Admitting: Obstetrics and Gynecology

## 2015-10-16 NOTE — Telephone Encounter (Signed)
Spoke with patient. Patient would like to reschedule her ultrasound at this time. Requesting a late afternoon appointment so that she may get someone to drive her. Appointment rescheduled for 10/23/2015 at 4 pm with 4:15 pm consult with Dr.Jertson. She is agreeable to date and time. Patient verbalized understanding of the [facility] appointment cancellation policy. Advised will need to cancel or reschedule within 72 business hours of appointment (3 business days) or will have $150.00 late cancellation fee placed to account.   Routing to provider for final review. Patient agreeable to disposition. Will close encounter.

## 2015-10-16 NOTE — Telephone Encounter (Signed)
Patient called and spoke to [place] at 9:55am today to reschedule her PUS/OV with Dr.Jertson   *appt. had been cx on reminder call (10/13/15 @ 10:49am)  I left patient a msg to call our office to make sure that this was not an error. (10/14/15 @7 :47am) nr * I left patient another message to call and confirm that she wants to cancel/keep this appointment. I did not cancel this appointment due to not speaking with the patient and the appointment was recently scheduled. I will cancel these appointment's in the future.

## 2015-10-23 ENCOUNTER — Encounter: Payer: Self-pay | Admitting: Obstetrics and Gynecology

## 2015-10-23 ENCOUNTER — Ambulatory Visit (INDEPENDENT_AMBULATORY_CARE_PROVIDER_SITE_OTHER): Payer: Medicare Other | Admitting: Obstetrics and Gynecology

## 2015-10-23 ENCOUNTER — Ambulatory Visit (INDEPENDENT_AMBULATORY_CARE_PROVIDER_SITE_OTHER): Payer: Medicare Other

## 2015-10-23 VITALS — BP 164/90 | HR 84 | Resp 18 | Wt 118.0 lb

## 2015-10-23 DIAGNOSIS — N813 Complete uterovaginal prolapse: Secondary | ICD-10-CM | POA: Diagnosis not present

## 2015-10-23 NOTE — Progress Notes (Signed)
Patient ID: Rachel Walters, female   DOB: 1926-03-23, 82 y.o.   MRN: [id number] GYNECOLOGY  VISIT   HPI: 82 y.o.   Widowed  Caucasian  female   [id number] with No LMP recorded. Patient is postmenopausal.   here for Pelvic U/S. The patient is PMP with complete procidentia who comes in for an ultrasound secondary to PMP spotting and concern about urinary retention. She is not a good surgical candidate, her pulmonologist has been discussing hospice with her.   GYNECOLOGIC HISTORY: No LMP recorded. Patient is postmenopausal. Contraception:postmenopause Menopausal hormone therapy: none         OB History    Gravida Para Term Preterm AB TAB SAB Ectopic Multiple Living   3 3 3       3          Patient Active Problem List   Diagnosis Date Noted  . Esophageal spasm   . Right leg DVT ([place]) 09/17/2014  . Multifocal atrial tachycardia ([place]) 09/05/2014  . Leg DVT (deep venous thromboembolism), acute ([place]) 09/03/2014  . Hypotension 09/02/2014  . Sepsis ([place]) 09/02/2014  . SIRS (systemic inflammatory response syndrome) ([place]) 09/02/2014  . Acute on chronic respiratory failure ([place]) 09/02/2014  . Aspiration pneumonia ([place])   . Pyrexia   . Arterial hypotension   . Pulmonary nodule, right 06/24/2014  . ILD (interstitial lung disease) ([place]) 05/24/2014  . Dyspnea 04/23/2014  . Chronic hypoxemic respiratory failure ([place]) 04/23/2014  . Esophageal reflux 05/19/2012  . Presbyesophagus 05/19/2012  . Dysphagia 05/02/2012  . Fall 07/09/2011  . Multiple fractures of ribs of left side x2 07/09/2011  . COPD (chronic obstructive pulmonary disease) ([place]) 07/09/2011  . Hyperlipidemia 07/09/2011  . Essential hypertension 08/01/2007  . ASTHMATIC BRONCHITIS, ACUTE 08/01/2007  . ALLERGIC RHINITIS, CHRONIC 08/01/2007  . ARTHRITIS 08/01/2007  . HEADACHE, CHRONIC 08/01/2007  . DIVERTICULOSIS, COLON 07/14/2007    Past Medical History  Diagnosis Date  . Arthritis   . COPD (chronic obstructive pulmonary disease)  ([place])   . Hypertension   . Neuromuscular disorder ([place])   . Asthmatic bronchitis   . Esophageal reflux 05/19/2012  . Dyspnea 04/23/2014  . ILD (interstitial lung disease) ([place]) 05/24/2014  . H. pylori infection   . Hiatal hernia   . Presbyesophagus   . Esophageal dysmotility   . Headache(784.0)     sinus  . Cervical dystonia   . PONV (postoperative nausea and vomiting)     with neck surgery  . History of home oxygen therapy     uses 3 liters oxygen all the time  . Leg DVT (deep venous thromboembolism), acute ([place]) 09/03/2014    left leg  . Cancer ([place]) 04/2015    lung cancer     Past Surgical History  Procedure Laterality Date  . Neck surgery  1992 and 1993    spur removed  . Balloon dilation  05/19/2012    Procedure: BALLOON DILATION;  Surgeon: Lafayette Dragon, MD;  Location: [facility];  Service: Endoscopy;  Laterality: N/A;  . Colonoscopy    . Esophagogastroduodenoscopy    . Esophagogastroduodenoscopy N/A 10/28/2014    Procedure: ESOPHAGOGASTRODUODENOSCOPY (EGD);  Surgeon: Lafayette Dragon, MD;  Location: [facility];  Service: Endoscopy;  Laterality: N/A;  . Savory dilation N/A 10/28/2014    Procedure: SAVORY DILATION;  Surgeon: Lafayette Dragon, MD;  Location: [facility];  Service: Endoscopy;  Laterality: N/A;  . Hardware removal Left 12/17/2014    Procedure: REMOVAL PLATE/SCREWS LEFT ELBOW;  Surgeon: Daryll Brod,  MD;  Location: [facility];  Service: Orthopedics;  Laterality: Left;    Current Outpatient Prescriptions  Medication Sig Dispense Refill  . Aclidinium Bromide (TUDORZA PRESSAIR) 400 MCG/ACT AEPB Inhale 1 puff into the lungs 2 (two) times daily. (Patient taking differently: Inhale 1 puff into the lungs 2 (two) times daily as needed (shortness of breathe.). ) 1 each 5  . acyclovir (ZOVIRAX) 800 MG tablet Take 800 mg by mouth at bedtime.    Marland Kitchen albuterol (PROAIR HFA) 108 (90 BASE) MCG/ACT inhaler Inhale 2 puffs into the lungs every 4 (four) hours as needed for  wheezing or shortness of breath.    Marland Kitchen amLODipine (NORVASC) 5 MG tablet Take 5 mg by mouth daily.    . Calcium Carbonate-Vitamin D (CALCIUM 600+D) 600-200 MG-UNIT TABS Take 1 tablet by mouth every morning.     . clorazepate (TRANXENE) 7.5 MG tablet Take 7.5 mg by mouth at bedtime as needed for anxiety.    . cyclobenzaprine (FLEXERIL) 5 MG tablet Take 5 mg by mouth 3 (three) times daily as needed for muscle spasms.    . fluticasone (FLONASE) 50 MCG/ACT nasal spray Place 2 sprays into both nostrils as needed for allergies or rhinitis.   6  . hyoscyamine (LEVBID) 0.375 MG 12 hr tablet TAKE 1 TABLET (0.375 MG TOTAL) BY MOUTH 2 (TWO) TIMES DAILY. 60 tablet 2  . irbesartan (AVAPRO) 150 MG tablet Take 150 mg by mouth daily.    Marland Kitchen lidocaine (LIDODERM) 5 % Place 1 patch onto the skin daily. Remove & Discard patch within 12 hours or as directed by MD    . loratadine (CLARITIN) 10 MG tablet Take 10 mg by mouth daily as needed for allergies.    . metoprolol tartrate (LOPRESSOR) 25 MG tablet Take 25 mg by mouth 2 (two) times daily.    . mirtazapine (REMERON) 15 MG tablet Take 7.5 mg by mouth at bedtime.    . Multiple Vitamin (MULTIVITAMIN WITH MINERALS) TABS tablet Take 1 tablet by mouth daily.    Marland Kitchen oxyCODONE-acetaminophen (PERCOCET/ROXICET) 5-325 MG tablet Take 1 tablet by mouth every 4 (four) hours as needed for severe pain. Reported on 10/02/2015    . polyethylene glycol (MIRALAX / GLYCOLAX) packet Take 17 g by mouth daily.    . pravastatin (PRAVACHOL) 20 MG tablet Take 20 mg by mouth every morning.     . traMADol (ULTRAM) 50 MG tablet Take 50 mg by mouth 2 (two) times daily as needed for moderate pain.      No current facility-administered medications for this visit.     ALLERGIES: Sulfa antibiotics and Morphine and related  Family History  Problem Relation Age of Onset  . Diabetes Mother   . Diabetes Sister   . Diabetes Maternal Aunt   . Breast cancer Sister     Social History   Social History   . Marital Status: Widowed    Spouse Name: N/A  . Number of Children: 3  . Years of Education: N/A   Occupational History  . Not on file.   Social History Main Topics  . Smoking status: Former Smoker -- 0.50 packs/day for 50 years    Types: Cigarettes    Quit date: 07/05/2011  . Smokeless tobacco: Never Used  . Alcohol Use: No  . Drug Use: No  . Sexual Activity: No   Other Topics Concern  . Not on file   Social History Narrative    Review of Systems  Constitutional: Negative.  HENT: Negative.   Eyes: Negative.   Respiratory: Negative.   Cardiovascular: Negative.   Gastrointestinal: Negative.   Genitourinary: Negative.   Musculoskeletal: Negative.   Skin: Negative.   Neurological: Negative.   Endo/Heme/Allergies: Negative.   Psychiatric/Behavioral: Negative.     PHYSICAL EXAMINATION:    BP 164/90 mmHg  Pulse 84  Resp 18  Wt 118 lb (53.524 kg)    General appearance: alert, cooperative and appears stated age  Ultrasound pictures were reviewed, she has a thickened endometrium, with likely polyps. She also has a simple ovarian cyst and a few small myomas. PVR is 147 cc  ASSESSMENT Procidentia, she has mild urinary retention. She is not a surgical candidate PMP spotting, thickened endometrium, likely polyps. Given her poor medical condition, will not further evaluate    PLAN Discussed the option of a pessary, showed the patient the type of pessary should would likely need (gelhorn or cube) She would like to try a pessary, but it is too hard for her to get rides into [place] Will refer her to a GYN in [place]   An After Visit Summary was printed and given to the patient.

## 2015-12-12 ENCOUNTER — Ambulatory Visit (INDEPENDENT_AMBULATORY_CARE_PROVIDER_SITE_OTHER): Payer: Medicare Other | Admitting: Pulmonary Disease

## 2015-12-12 ENCOUNTER — Encounter: Payer: Self-pay | Admitting: Pulmonary Disease

## 2015-12-12 VITALS — BP 102/64 | HR 79 | Ht 65.0 in | Wt 120.8 lb

## 2015-12-12 DIAGNOSIS — J9620 Acute and chronic respiratory failure, unspecified whether with hypoxia or hypercapnia: Secondary | ICD-10-CM

## 2015-12-12 DIAGNOSIS — J432 Centrilobular emphysema: Secondary | ICD-10-CM

## 2015-12-12 MED ORDER — ACLIDINIUM BROMIDE 400 MCG/ACT IN AEPB: 1.0000 | INHALATION_SPRAY | Freq: Two times a day (BID) | RESPIRATORY_TRACT | Status: DC

## 2015-12-12 NOTE — Assessment & Plan Note (Signed)
Stable COPD Stage D No recent exacerbations Vaccines up to date. Plan: We will send in a prescription for renewal for your Michel Bickers, once daily. Continue your oxygen at 2.5 L [place]  Consider adding Boost or Ensure to your diet as a meal supplement. Follow up with Dr. Lake Bells in 4 months. Please contact office for sooner follow up if symptoms do not improve or worsen or seek emergency care.

## 2015-12-12 NOTE — Patient Instructions (Addendum)
I am glad you are doing well.  We will send in a prescription for renewal for your Michel Bickers, once daily. Continue your oxygen at 2.5 L [place]  Consider adding Boost or Ensure to your diet as a meal supplement. Follow up with Dr. Lake Bells in 4 months. Please contact office for sooner follow up if symptoms do not improve or worsen or seek emergency care.

## 2015-12-12 NOTE — Assessment & Plan Note (Signed)
Doing well on oxygen at 2.5 L pulsed. Plan: Continue wearing oxygen as above. Please contact office for sooner follow up if symptoms do not improve or worsen or seek emergency care

## 2015-12-12 NOTE — Progress Notes (Signed)
History of Present Illness Rachel Walters is a 82 y.o. female with Grade D COPD , oxygen dependent, followed by Dr. Lake Bells.  Synopsis: GOLD grade D COPD first seen by pulmonary in 2015 04/2014 Echo> normal LVEF, grade 1 DD, normal RV and RVSP 04/23/2014 Spirometry> 56% FEV1 0.84L (45% pred) Started on 2 L oxygen with exertion and each bedtime in 2015  12/12/2015  Follow up : Pt. Presents to the office today for 4 month follow up.Doing well. Remains on oxygen at 3 L pulsed continuously.She is taking her Rachel Walters once daily as prescribed.Productive cough at night only. Sputum is sometimes yellow and sometimes white.She denies fever,  chest pain,  orthopnea or hemoptysis.She is not having to use her rescue medications/ nebs at all.We discussed that she has recently been worked up by gynecology for uterine prolapse. We discussed hospice at an option for her at any time, she just needs to let us know when she wants to make that transition.   Past medical hx Past Medical History  Diagnosis Date  . Arthritis   . COPD (chronic obstructive pulmonary disease) ([place])   . Hypertension   . Neuromuscular disorder ([place])   . Asthmatic bronchitis   . Esophageal reflux 05/19/2012  . Dyspnea 04/23/2014  . ILD (interstitial lung disease) ([place]) 05/24/2014  . H. pylori infection   . Hiatal hernia   . Presbyesophagus   . Esophageal dysmotility   . Headache(784.0)     sinus  . Cervical dystonia   . PONV (postoperative nausea and vomiting)     with neck surgery  . History of home oxygen therapy     uses 3 liters oxygen all the time  . Leg DVT (deep venous thromboembolism), acute ([place]) 09/03/2014    left leg  . Cancer ([place]) 04/2015    lung cancer   . Complete uterine prolapse      Past surgical hx, Family hx, Social hx all reviewed.  Current Outpatient Prescriptions on File Prior to Visit  Medication Sig  . Aclidinium Bromide (TUDORZA PRESSAIR) 400 MCG/ACT AEPB Inhale 1 puff into the lungs 2 (two)  times daily. (Patient taking differently: Inhale 1 puff into the lungs 2 (two) times daily as needed (shortness of breathe.). )  . acyclovir (ZOVIRAX) 800 MG tablet Take 800 mg by mouth at bedtime.  Marland Kitchen albuterol (PROAIR HFA) 108 (90 BASE) MCG/ACT inhaler Inhale 2 puffs into the lungs every 4 (four) hours as needed for wheezing or shortness of breath.  Marland Kitchen amLODipine (NORVASC) 5 MG tablet Take 5 mg by mouth daily.  . Calcium Carbonate-Vitamin D (CALCIUM 600+D) 600-200 MG-UNIT TABS Take 1 tablet by mouth every morning.   . clorazepate (TRANXENE) 7.5 MG tablet Take 7.5 mg by mouth at bedtime as needed for anxiety.  . cyclobenzaprine (FLEXERIL) 5 MG tablet Take 5 mg by mouth 3 (three) times daily as needed for muscle spasms.  . fluticasone (FLONASE) 50 MCG/ACT nasal spray Place 2 sprays into both nostrils as needed for allergies or rhinitis.   . hyoscyamine (LEVBID) 0.375 MG 12 hr tablet TAKE 1 TABLET (0.375 MG TOTAL) BY MOUTH 2 (TWO) TIMES DAILY.  Marland Kitchen irbesartan (AVAPRO) 150 MG tablet Take 150 mg by mouth daily.  Marland Kitchen lidocaine (LIDODERM) 5 % Place 1 patch onto the skin daily. Remove & Discard patch within 12 hours or as directed by MD  . loratadine (CLARITIN) 10 MG tablet Take 10 mg by mouth daily as needed for allergies.  . metoprolol tartrate (LOPRESSOR)  25 MG tablet Take 25 mg by mouth 2 (two) times daily.  . mirtazapine (REMERON) 15 MG tablet Take 7.5 mg by mouth at bedtime.  . Multiple Vitamin (MULTIVITAMIN WITH MINERALS) TABS tablet Take 1 tablet by mouth daily.  . polyethylene glycol (MIRALAX / GLYCOLAX) packet Take 17 g by mouth daily.  . pravastatin (PRAVACHOL) 20 MG tablet Take 20 mg by mouth every morning.   . traMADol (ULTRAM) 50 MG tablet Take 50 mg by mouth 2 (two) times daily as needed for moderate pain.    No current facility-administered medications on file prior to visit.     Allergies  Allergen Reactions  . Sulfa Antibiotics Other (See Comments)    Skin turned yellow  . Morphine  And Related Nausea And Vomiting    Review Of Systems:  Constitutional:   +  weight loss, no night sweats,  Fevers, chills, fatigue, or  lassitude.  HEENT:   No headaches,  Difficulty swallowing,  Tooth/dental problems, or  Sore throat,                No sneezing, itching, ear ache, nasal congestion, post nasal drip,   CV:  No chest pain,  Orthopnea, PND, swelling in lower extremities, anasarca, dizziness, palpitations, syncope.   GI  No heartburn, indigestion, abdominal pain, nausea, vomiting, diarrhea, change in bowel habits, loss of appetite, bloody stools.   Resp: + shortness of breath with exertion not at rest.  No excess mucus, + productive cough,  No non-productive cough,  No coughing up of blood.  No change in color of mucus.  No wheezing.  No chest wall deformity  Skin: no rash or lesions.  GU: no dysuria, change in color of urine, no urgency or frequency.  No flank pain, no hematuria   MS:  No joint pain or swelling.  No decreased range of motion.  No back pain.  Psych:  No change in mood or affect. No depression or anxiety.  No memory loss.   Vital Signs BP 102/64 mmHg  Pulse 79  Ht 5\' 5"  (1.651 m)  Wt 120 lb 12.8 oz (54.795 kg)  BMI 20.10 kg/m2  SpO2 96% Blood Pressure rechecked by me was 118/62  Physical Exam:  General- No distress,  A&Ox3, elderly frail female in wheelchair ENT: No sinus tenderness, TM clear, pale nasal mucosa, no oral exudate,no post nasal drip, no LAN Cardiac: S1, S2, regular rate and rhythm, no murmur Chest: No wheeze/ rales/ dullness; no accessory muscle use, no nasal flaring, no sternal retractions Abd.: Soft Non-tender Ext: No clubbing cyanosis, edema Neuro:  normal strength Skin: No rashes, warm and dry Psych: normal mood and behavior   Assessment/Plan  COPD (chronic obstructive pulmonary disease) (HCC) Stable COPD Stage D No recent exacerbations Vaccines up to date. Plan: We will send in a prescription for renewal for your  Rachel Walters, once daily. Continue your oxygen at 2.5 L [place]  Consider adding Boost or Ensure to your diet as a meal supplement. Follow up with Dr. Lake Bells in 4 months. Please contact office for sooner follow up if symptoms do not improve or worsen or seek emergency care.    Acute on chronic respiratory failure Doing well on oxygen at 2.5 L pulsed. Plan: Continue wearing oxygen as above. Please contact office for sooner follow up if symptoms do not improve or worsen or seek emergency care      Magdalen Spatz, NP 12/12/2015  2:25 PM   Attending:  Vivia Walters was  seen today with Rachel Walters, I agree with the findings from her note above.  Rachel Walters has been doing well recently. She was upset by conversation she had with her gynecologist. However, she states that she is fine when at rest but she has some shortness of breath when she gets up and moves around.  On exam: Crackles basis which is her baseline, normal effort Somewhat emaciated  Impression: Advanced COPD Lung nodule, likely adenocarcinoma, she does not want to treat this or pursue a diagnosis Next line discussion: We discussed her overall situation again today. She has advanced COPD but I still do not feel like she needs to be on hospice right now. I explained to her that this may be something for her in the future but she doesn't need it today. I do not feel however that she should go under general anesthesia for any sort of procedure.  Plan: Continue bronchodilators Follow-up 4 months  Rachel Awkward, MD [facility] Pager: [phone] Cell: [phone] After 3pm or if no response, call [phone]

## 2015-12-12 NOTE — Addendum Note (Signed)
Addended by: Virl Cagey on: 12/12/2015 03:36 PM   Modules accepted: Orders

## 2016-03-01 ENCOUNTER — Telehealth: Payer: Self-pay | Admitting: Gastroenterology

## 2016-03-01 NOTE — Telephone Encounter (Signed)
The pt will follow up with PCP until appt with Dr Loletha Carrow.  She was also notified that she has been added to the wait list.

## 2016-03-16 ENCOUNTER — Encounter: Payer: Self-pay | Admitting: Obstetrics & Gynecology

## 2016-03-16 ENCOUNTER — Ambulatory Visit (INDEPENDENT_AMBULATORY_CARE_PROVIDER_SITE_OTHER): Payer: Medicare Other | Admitting: Obstetrics & Gynecology

## 2016-03-16 VITALS — BP 135/83 | HR 106 | Ht 65.0 in | Wt 122.0 lb

## 2016-03-16 DIAGNOSIS — N813 Complete uterovaginal prolapse: Secondary | ICD-10-CM

## 2016-03-16 MED ORDER — ESTRADIOL 0.1 MG/GM VA CREA: 1.0000 g | TOPICAL_CREAM | VAGINAL | 12 refills | Status: DC

## 2016-03-16 NOTE — Progress Notes (Signed)
   Subjective:    Patient ID: Rachel Walters, female    DOB: 10-08-1925, 82 y.o.   MRN: [id number]  HPI 82 yo WW lady with complete procedentia here for a pessary fitting. She used a pessary for about 10 years but her doctor died. She has a chronic constipation.   Review of Systems     Objective:   Physical Exam Frail WFNAD I fitted her with a 2 1/4 inch Gelhorn which solved her problems. She had incontinence with the cube. Severe atrophy      Assessment & Plan:  Estrogen cream QOD 1 gram RTC when pessary arrives She would like to come every 2 weeks to have it removed and cleaned

## 2016-03-17 ENCOUNTER — Telehealth: Payer: Self-pay

## 2016-03-17 NOTE — Telephone Encounter (Signed)
Pt. Stated that the medication Dr.Dove was sending to her pharmacy wasn't available. I checked and Dr.Dove did send it so I have left a message with the pharmacy and when they contact me back I will call the pt back.

## 2016-04-01 ENCOUNTER — Ambulatory Visit (INDEPENDENT_AMBULATORY_CARE_PROVIDER_SITE_OTHER): Payer: Medicare Other | Admitting: Gastroenterology

## 2016-04-01 ENCOUNTER — Encounter (INDEPENDENT_AMBULATORY_CARE_PROVIDER_SITE_OTHER): Payer: Self-pay

## 2016-04-01 ENCOUNTER — Encounter: Payer: Self-pay | Admitting: Gastroenterology

## 2016-04-01 VITALS — BP 122/70 | HR 80 | Ht 64.0 in | Wt 121.2 lb

## 2016-04-01 DIAGNOSIS — K2289 Other specified disease of esophagus: Secondary | ICD-10-CM

## 2016-04-01 DIAGNOSIS — K5730 Diverticulosis of large intestine without perforation or abscess without bleeding: Secondary | ICD-10-CM | POA: Diagnosis not present

## 2016-04-01 DIAGNOSIS — K5901 Slow transit constipation: Secondary | ICD-10-CM | POA: Diagnosis not present

## 2016-04-01 DIAGNOSIS — K228 Other specified diseases of esophagus: Secondary | ICD-10-CM

## 2016-04-01 DIAGNOSIS — J432 Centrilobular emphysema: Secondary | ICD-10-CM | POA: Diagnosis not present

## 2016-04-01 DIAGNOSIS — R1012 Left upper quadrant pain: Secondary | ICD-10-CM | POA: Diagnosis not present

## 2016-04-01 DIAGNOSIS — R143 Flatulence: Secondary | ICD-10-CM

## 2016-04-01 NOTE — Patient Instructions (Addendum)
If you are age 80 or older, your body mass index should be between 23-30. Your Body mass index is 20.81 kg/m. If this is out of the aforementioned range listed, please consider follow up with your Primary Care Provider.  If you are age 73 or younger, your body mass index should be between 19-25. Your Body mass index is 20.81 kg/m. If this is out of the aformentioned range listed, please consider follow up with your Primary Care Provider.   Food Guidelines for a sensitive stomach  Many people have difficulty digesting certain foods, causing a variety of distressing and embarrassing symptoms such as abdominal pain, bloating and gas.  These foods may need to be avoided or consumed in small amounts.  Here are some tips that might be helpful for you.  1.   Lactose intolerance is the difficulty or complete inability to digest lactose, the natural sugar in milk and anything made from milk.  This condition is harmless, common, and can begin any time during life.  Some people can digest a modest amount of lactose while others cannot tolerate any.  Also, not all dairy products contain equal amounts of lactose.  For example, hard cheeses such as parmesan have less lactose than soft cheeses such as cheddar.  Yogurt has less lactose than milk or cheese.  Many packaged foods (even many brands of bread) have milk, so read ingredient lists carefully.  It is difficult to test for lactose intolerance, so just try avoiding lactose as much as possible for a week and see what happens with your symptoms.  If you seem to be lactose intolerant, the best plan is to avoid it (but make sure you get calcium from another source).  The next best thing is to use lactase enzyme supplements, available over the counter everywhere.  Just know that many lactose intolerant people need to take several tablets with each serving of dairy to avoid symptoms.  Lastly, a lot of restaurant food is made with milk or butter.  Many are things you might  not suspect, such as mashed potatoes, rice and pasta (cooked with butter) and "grilled" items.  If you are lactose intolerant, it never hurts to ask your server what has milk or butter.  2.   Fiber is an important part of your diet, but not all fiber is well-tolerated.  Insoluble fiber such as bran is often consumed by normal gut bacteria and converted into gas.  Soluble fiber such as oats, squash, carrots and green beans are typically tolerated better.  3.   Some types of carbohydrates can be poorly digested.  Examples include: fructose (apples, cherries, pears, raisins and other dried fruits), fructans (onions, zucchini, large amounts of wheat), sorbitol/mannitol/xylitol and sucralose/Splenda (common artificial sweeteners), and raffinose (lentils, broccoli, cabbage, asparagus, brussel sprouts, many types of beans).  Do a Development worker, community for [employer] and you will find helpful information. Beano, a dietary supplement, will often help with raffinose-containing foods.  As with lactase tablets, you may need several per serving.  4.   Whenever possible, avoid processed food&meats and chemical additives.  High fructose corn syrup, a common sweetener, may be difficult to digest.  Eggs and soy (comes from the soybean, and added to many foods now) are the other most common bloating/gassy foods.  - Dr. Herma Ard [facility]

## 2016-04-01 NOTE — Progress Notes (Signed)
[facility] Progress Note  Chief Complaint: Left upper quadrant pain and constipation  Subjective  History:  Rachel Walters sees me for the first time, having previously seen Dr. Olevia Perches as recently as March 2016. He has multiple issues including chronic constipation, severe diverticulosis seen on colonoscopy in 2008, esophageal motility disorder with previous upper endoscopy and dilation. There was no ring or stricture, but a balloon dilation was performed a few years ago. It is not clear that it really help the patient's dysphagia by her report. Her appetite has been slowly declining, she continues to have oxygen requiring interstitial lung disease and her mobility is poor. She tends toward constipation that has lately improved with combination of Linzess and MiraLAX. If she takes just one or the other, things are not quite as good. There [name] is bothered by intermittent left upper quadrant cramps and tightness that occurs when she sits down in the evening. It is improved if she changes position. Lastly, she complains of excess gas production ROS: Cardiovascular:  no chest pain Respiratory: Chronic dyspnea  The patient's Past Medical, Family and Social History were reviewed and are on file in the EMR.  Objective:  Med list reviewed  Vital signs in last 24 hrs: Vitals:   04/01/16 1544  BP: 122/70  Pulse: 80    Physical Exam Chronically ill-appearing elderly woman, mildly dyspneic at rest wearing supplemental oxygen  HEENT: sclera anicteric, oral mucosa moist without lesions  Neck: supple, no thyromegaly, JVD or lymphadenopathy  Cardiac: RRR without murmurs, S1S2 heard, no peripheral edema  Pulm: Fine basilar crackles bilaterally, normal RR and effort noted  Abdomen: soft, No tenderness, with active bowel sounds. No guarding or palpable hepatosplenomegaly.  Skin; warm and dry, no jaundice or rash   Radiologic studies:  nml kub 02/24/16 - care everywhere report was reviewed  ([facility])  @ASSESSMENTPLANBEGIN @ Assessment: Encounter Diagnoses  Name Primary?  . Diverticulosis of large intestine without hemorrhage Yes  . Centrilobular emphysema ([place])   . LUQ pain   . Slow transit constipation   . Presbyesophagus   . Flatulence     Her dysphagia appears to be a motility disorder, and I am not planning another upper endoscopy because I think the risks outweigh the expected benefits. Her abdominal pain sounds benign, I suspect it is from diverticulosis associated tortuosity, perhaps with the splenic flexure being trapped under the rib cage while in the sitting position. She reports that it always improves with change in position. Current regimen for her diverticulosis related constipation appears to be working well. I also discussed how the bloating and gas is likely dietary related, and some diet -related literature was given to her. Plan: She is reassured, and will see me as needed.  Total time 30 minutes, over half spent in counseling and coordination of care.   Rachel Walters

## 2016-04-02 ENCOUNTER — Ambulatory Visit (INDEPENDENT_AMBULATORY_CARE_PROVIDER_SITE_OTHER): Payer: Medicare Other | Admitting: Pulmonary Disease

## 2016-04-02 ENCOUNTER — Encounter: Payer: Self-pay | Admitting: Pulmonary Disease

## 2016-04-02 VITALS — BP 112/64 | HR 87 | Ht 64.0 in | Wt 123.0 lb

## 2016-04-02 DIAGNOSIS — J430 Unilateral pulmonary emphysema [MacLeod's syndrome]: Secondary | ICD-10-CM

## 2016-04-02 DIAGNOSIS — R911 Solitary pulmonary nodule: Secondary | ICD-10-CM | POA: Diagnosis not present

## 2016-04-02 DIAGNOSIS — J9611 Chronic respiratory failure with hypoxia: Secondary | ICD-10-CM

## 2016-04-02 DIAGNOSIS — Z23 Encounter for immunization: Secondary | ICD-10-CM

## 2016-04-02 DIAGNOSIS — J849 Interstitial pulmonary disease, unspecified: Secondary | ICD-10-CM

## 2016-04-02 NOTE — Patient Instructions (Signed)
We will arrange for a noncontrast CT scan of your chest to evaluate the pulmonary nodule in October 2017 Keep taking the Rachel Walters as you are doing We'll see you back in January

## 2016-04-02 NOTE — Progress Notes (Signed)
Subjective:    Patient ID: Rachel Walters, female    DOB: [birth year], 82 y.o.   MRN: [id number]  Synopsis: GOLD grade D COPD first seen by pulmonary in 2015 04/2014 Echo> normal LVEF, grade 1 DD, normal RV and RVSP 04/23/2014 Spirometry> 56% FEV1 0.84L (45% pred) Started on 2 L oxygen with exertion and each bedtime in 2015  HPI Chief Complaint  Patient presents with  . Follow-up    Pt c/o stable sob with exertion, occasional prod cough with white/yellow mucus.    Rachel Walters says she has good days and bad days:  >she has pain in her back and shoulders  No appetite, no weight loss.  Her breathing is OK, she will get short of breath when she does some activities. Activity about the same as the last visit.  Has not needed albuterol.  No bronchitis.  Taking [place] regularly.   Past Medical History:  Diagnosis Date  . Arthritis   . Asthmatic bronchitis   . Cancer ([place]) 04/2015   lung cancer   . Cervical dystonia   . Complete uterine prolapse   . COPD (chronic obstructive pulmonary disease) ([place])   . Dyspnea 04/23/2014  . Esophageal dysmotility   . Esophageal reflux 05/19/2012  . H. pylori infection   . Headache(784.0)    sinus  . Hiatal hernia   . History of home oxygen therapy    uses 3 liters oxygen all the time  . Hypertension   . ILD (interstitial lung disease) ([place]) 05/24/2014  . Leg DVT (deep venous thromboembolism), acute ([place]) 09/03/2014   left leg  . Neuromuscular disorder ([place])   . PONV (postoperative nausea and vomiting)    with neck surgery  . Presbyesophagus      Review of Systems  Constitutional: Positive for fatigue. Negative for chills and fever.  HENT: Negative for postnasal drip, rhinorrhea and sinus pressure.   Respiratory: Positive for shortness of breath. Negative for cough and wheezing.   Cardiovascular: Negative for chest pain, palpitations and leg swelling.       Objective:   Physical Exam  Vitals:   04/02/16 1420  BP: 112/64  BP  Location: Left Arm  Cuff Size: Normal  Pulse: 87  SpO2: 93%  Weight: 123 lb (55.8 kg)  Height: 5\' 4"  (1.626 m)  RA  Gen: well appearing, no acute distress HEENT: NCAT, EOMi, OP clear PULM: Crackles in bases bilaterally,  CV: RRR, no mgr, no JVD AB: BS+, soft, nontender,  Ext: warm, trace edema, no clubbing, no cyanosis Derm: no rash or skin breakdown Neuro: A&Ox4 MAEW   04/2015 CT chest  IMPRESSION: 1. The triangular peripheral band of abnormal interstitial accentuation and sub solid density in the right upper lobe has slowly increased over the past 4 years. Likewise the marginally spiculated 1.3 by 0.9 cm right lower lobe nodule has slowly increased over the past 3 years. Low grade adenocarcinoma is not excluded. Given the very slow rate of change along with the patient's age, and depending on comorbidities, periodic observation may be the most sensible course. 2. Coronary, aortic arch, and branch vessel atherosclerotic vascular disease. 3. Borderline paratracheal adenopathy. 4. Stable thoracic and lumbar compression fractures.  Images personally reviewed     Assessment & Plan:   COPD (chronic obstructive pulmonary disease) ([place]) This has been a stable interval for her. She has not had an exacerbation since the last visit. She remained stable on Tudorza.  Plan: Continue Tudorza Flu shot today Follow-up 4  months  Chronic hypoxemic respiratory failure (HCC) Continue 2L O2 continuously  ILD (interstitial lung disease) ([place]) There is been no clear evidence of progression of her nonspecific interstitial lung disease.  Plan: Repeat CT chest  Pulmonary nodule, right As noted in multiple previous discussions, she has no interest in pursuing a biopsy of this or treatment. She understands it's very likely malignant. However, she and her family are interested in knowing if it has grown because that would help them understand her overall prognosis. They have not changed  their mind about diagnostic or treatment options.  Plan: Repeat CT chest in October 2017 which will be 1 year since the last study > 50% of time spent face to face in a 26 minute visit  Updated Medication List Outpatient Encounter Prescriptions as of 04/02/2016  Medication Sig  . Aclidinium Bromide (TUDORZA PRESSAIR) 400 MCG/ACT AEPB Inhale 1 puff into the lungs 2 (two) times daily.  Marland Kitchen acyclovir (ZOVIRAX) 800 MG tablet Take 800 mg by mouth at bedtime.  Marland Kitchen albuterol (PROAIR HFA) 108 (90 BASE) MCG/ACT inhaler Inhale 2 puffs into the lungs every 4 (four) hours as needed for wheezing or shortness of breath.  Marland Kitchen amLODipine (NORVASC) 5 MG tablet Take 5 mg by mouth daily.  . Calcium Carbonate-Vitamin D (CALCIUM 600+D) 600-200 MG-UNIT TABS Take 1 tablet by mouth every morning.   . clorazepate (TRANXENE) 7.5 MG tablet Take 7.5 mg by mouth at bedtime as needed for anxiety.  . cyclobenzaprine (FLEXERIL) 5 MG tablet Take 5 mg by mouth 3 (three) times daily as needed for muscle spasms.  Marland Kitchen DILT-XR 120 MG 24 hr capsule   . estradiol (ESTRACE VAGINAL) 0.1 MG/GM vaginal cream Place 1 g vaginally 3 (three) times a week.  . fluticasone (FLONASE) 50 MCG/ACT nasal spray Place 2 sprays into both nostrils as needed for allergies or rhinitis.   . hyoscyamine (LEVBID) 0.375 MG 12 hr tablet TAKE 1 TABLET (0.375 MG TOTAL) BY MOUTH 2 (TWO) TIMES DAILY.  . hyoscyamine (LEVSIN, ANASPAZ) 0.125 MG tablet   . irbesartan (AVAPRO) 150 MG tablet Take 150 mg by mouth daily.  . irbesartan (AVAPRO) 300 MG tablet   . lidocaine (LIDODERM) 5 % Place 1 patch onto the skin daily. Remove & Discard patch within 12 hours or as directed by MD  . linaclotide (LINZESS) 290 MCG CAPS capsule Take 290 mcg by mouth daily.  Marland Kitchen loratadine (CLARITIN) 10 MG tablet Take 10 mg by mouth daily as needed for allergies.  . metoprolol tartrate (LOPRESSOR) 25 MG tablet Take 25 mg by mouth 2 (two) times daily.  . mirtazapine (REMERON) 15 MG tablet Take 7.5 mg  by mouth at bedtime.  . Multiple Vitamin (MULTIVITAMIN WITH MINERALS) TABS tablet Take 1 tablet by mouth daily.  . OXYGEN Inhale 3 L into the lungs daily.  . polyethylene glycol (MIRALAX / GLYCOLAX) packet Take 17 g by mouth daily.  . pravastatin (PRAVACHOL) 20 MG tablet Take 20 mg by mouth every morning.   . traMADol (ULTRAM) 50 MG tablet Take 50 mg by mouth 2 (two) times daily as needed for moderate pain.    No facility-administered encounter medications on file as of 04/02/2016.

## 2016-04-02 NOTE — Assessment & Plan Note (Signed)
This has been a stable interval for her. She has not had an exacerbation since the last visit. She remained stable on Tudorza.  Plan: Continue Rachel Walters Flu shot today Follow-up 4 months

## 2016-04-02 NOTE — Assessment & Plan Note (Signed)
There is been no clear evidence of progression of her nonspecific interstitial lung disease.  Plan: Repeat CT chest

## 2016-04-02 NOTE — Assessment & Plan Note (Signed)
As noted in multiple previous discussions, she has no interest in pursuing a biopsy of this or treatment. She understands it's very likely malignant. However, she and her family are interested in knowing if it has grown because that would help them understand her overall prognosis. They have not changed their mind about diagnostic or treatment options.  Plan: Repeat CT chest in October 2017 which will be 1 year since the last study

## 2016-04-02 NOTE — Assessment & Plan Note (Signed)
Continue 2 L O2 continuously 

## 2016-04-23 ENCOUNTER — Encounter: Payer: Self-pay | Admitting: Gastroenterology

## 2016-05-24 ENCOUNTER — Ambulatory Visit (INDEPENDENT_AMBULATORY_CARE_PROVIDER_SITE_OTHER)
Admission: RE | Admit: 2016-05-24 | Discharge: 2016-05-24 | Disposition: A | Discharge status: Home / Self Care | Payer: Medicare Other | Source: Ambulatory Visit | Attending: Pulmonary Disease | Admitting: Pulmonary Disease

## 2016-05-24 DIAGNOSIS — R911 Solitary pulmonary nodule: Secondary | ICD-10-CM

## 2016-05-27 ENCOUNTER — Telehealth: Payer: Self-pay | Admitting: Pulmonary Disease

## 2016-05-27 NOTE — Progress Notes (Signed)
Spoke with the pt and notified of results and she verbalized understanding and denied any questions

## 2016-05-27 NOTE — Telephone Encounter (Signed)
Notes Recorded by Juanito Doom, MD on 05/25/2016 at 4:56 PM EDT Rachel Walters,  Please let her know that the nodules have not changed, this is good  Thanks, [employer] with pt and notified of results per Dr. Lake Bells. Pt verbalized understanding and denied any questions.

## 2016-08-20 ENCOUNTER — Ambulatory Visit (INDEPENDENT_AMBULATORY_CARE_PROVIDER_SITE_OTHER)
Admission: RE | Admit: 2016-08-20 | Discharge: 2016-08-20 | Disposition: A | Discharge status: Home / Self Care | Payer: Medicare Other | Source: Ambulatory Visit | Attending: Pulmonary Disease | Admitting: Pulmonary Disease

## 2016-08-20 ENCOUNTER — Telehealth: Payer: Self-pay | Admitting: Cardiovascular Disease

## 2016-08-20 ENCOUNTER — Ambulatory Visit (INDEPENDENT_AMBULATORY_CARE_PROVIDER_SITE_OTHER): Payer: Medicare Other | Admitting: Pulmonary Disease

## 2016-08-20 ENCOUNTER — Ambulatory Visit: Payer: Medicare Other | Admitting: Pulmonary Disease

## 2016-08-20 ENCOUNTER — Encounter: Payer: Self-pay | Admitting: Pulmonary Disease

## 2016-08-20 VITALS — BP 128/72 | HR 86 | Ht 64.0 in | Wt 119.0 lb

## 2016-08-20 DIAGNOSIS — I471 Supraventricular tachycardia: Secondary | ICD-10-CM

## 2016-08-20 DIAGNOSIS — J9611 Chronic respiratory failure with hypoxia: Secondary | ICD-10-CM | POA: Diagnosis not present

## 2016-08-20 DIAGNOSIS — R0602 Shortness of breath: Secondary | ICD-10-CM

## 2016-08-20 DIAGNOSIS — R000 Tachycardia, unspecified: Secondary | ICD-10-CM

## 2016-08-20 DIAGNOSIS — J849 Interstitial pulmonary disease, unspecified: Secondary | ICD-10-CM

## 2016-08-20 DIAGNOSIS — R911 Solitary pulmonary nodule: Secondary | ICD-10-CM

## 2016-08-20 DIAGNOSIS — J430 Unilateral pulmonary emphysema [MacLeod's syndrome]: Secondary | ICD-10-CM

## 2016-08-20 MED ORDER — LEVOFLOXACIN 500 MG PO TABS: 500.0000 mg | ORAL_TABLET | Freq: Every day | ORAL | 0 refills | Status: DC

## 2016-08-20 NOTE — Progress Notes (Signed)
Subjective:    Patient ID: Rachel Walters, female    DOB: February 18, 1926, 83 y.o.   MRN: [id number]  Synopsis: GOLD grade D COPD first seen by pulmonary in 2015 04/2014 Echo> normal LVEF, grade 1 DD, normal RV and RVSP 04/23/2014 Spirometry> 56% FEV1 0.84L (45% pred) Started on 2 L oxygen with exertion and each bedtime in 2015  HPI Chief Complaint  Patient presents with  . Follow-up    pt c/o some worsening SOB with exertion, sinus congestion, chest congestion, prod cough with light to dark yellow mucus X1 week.    Jorge had some congestion start last week with a low grade fever and sinus congestion which has now caused throat soreness.  She also has wheezing and some chest congestion now.  She has a lot more shortness of breath than normal with exertion right now too.  No blood in sputum, its mostly yellow, sometimes dark.  Prior to this she had been doing OK.  No antibiotics until Dr. Radene Gunning treated her with a z pack a few weeks ago.    She has been notice that her heart has been racing more in the mornings, this is associated with dyspnea.  She says that she has been taking her metoprolol and diltiazem.     Past Medical History:  Diagnosis Date  . Arthritis   . Asthmatic bronchitis   . Cancer ([place]) 04/2015   lung cancer   . Cervical dystonia   . Complete uterine prolapse   . COPD (chronic obstructive pulmonary disease) ([place])   . Dyspnea 04/23/2014  . Esophageal dysmotility   . Esophageal reflux 05/19/2012  . H. pylori infection   . Headache(784.0)    sinus  . Hiatal hernia   . History of home oxygen therapy    uses 3 liters oxygen all the time  . Hypertension   . ILD (interstitial lung disease) ([place]) 05/24/2014  . Leg DVT (deep venous thromboembolism), acute ([place]) 09/03/2014   left leg  . Neuromuscular disorder ([place])   . PONV (postoperative nausea and vomiting)    with neck surgery  . Presbyesophagus      Review of Systems  Constitutional: Positive for fatigue. Negative  for chills and fever.  HENT: Positive for rhinorrhea and sinus pressure. Negative for postnasal drip.   Respiratory: Positive for cough and shortness of breath. Negative for wheezing.   Cardiovascular: Negative for chest pain, palpitations and leg swelling.       Objective:   Physical Exam  Vitals:   08/20/16 1326  BP: 128/72  BP Location: Left Arm  Cuff Size: Normal  Pulse: 86  SpO2: 97%  Weight: 119 lb (54 kg)  Height: 5\' 4"  (1.626 m)  2L [place]  Gen: chronically ill appearing HENT: OP clear, TM's clear, neck supple PULM: Crackles left base no wheezing, normal percussion CV: RRR, few irregular beats, no mgr, trace edema GI: BS+, soft, nontender Derm: no cyanosis or rash Psyche: normal mood and affect   Records reviewed from her hospitalization in 2016 were cardiology saw her for multifocal atrial tachycardia and treated her with metoprolol.  BMET    Component Value Date/Time   NA 138 12/17/2014 1433   K 4.1 12/17/2014 1433   CL 95 (L) 12/17/2014 1433   CO2 26 09/06/2014 0519   GLUCOSE 91 12/17/2014 1433   BUN 20 12/17/2014 1433   CREATININE 1.00 12/17/2014 1433   CALCIUM 8.4 09/06/2014 0519   GFRNONAA 59 (L) 09/06/2014 [id number]  GFRAA 69 (L) 09/06/2014 0519        Assessment & Plan:   COPD (chronic obstructive pulmonary disease) ([place]) She appears to be having a very mild exacerbation which has been unresponsive to azithromycin. She is not wheezing on exam today. She does have some crackles in the left base which raises my suspicion for pneumonia. Given her COPD severity and previous exposure to antibiotics I think she needs something with more broad coverage and azithromycin.  Plan: Levaquin 5 days Stop azithromycin Chest x-ray to evaluate for pneumonia Continue broncho dilators as ordered  Chronic hypoxemic respiratory failure (HCC) Continue oxygen as prescribed  ILD (interstitial lung disease) ([place]) She desires to have no further workup. No further CTs  will be ordered.  Pulmonary nodule, right We discussed this again today. With her son present she stated that again that she desires no further CT scans and has no desire to undergo a biopsy.    Current Outpatient Prescriptions:  .  Aclidinium Bromide (TUDORZA PRESSAIR) 400 MCG/ACT AEPB, Inhale 1 puff into the lungs 2 (two) times daily., Disp: 3 each, Rfl: 3 .  acyclovir (ZOVIRAX) 800 MG tablet, Take 800 mg by mouth at bedtime., Disp: , Rfl:  .  albuterol (PROAIR HFA) 108 (90 BASE) MCG/ACT inhaler, Inhale 2 puffs into the lungs every 4 (four) hours as needed for wheezing or shortness of breath., Disp: , Rfl:  .  amLODipine (NORVASC) 5 MG tablet, Take 5 mg by mouth daily., Disp: , Rfl:  .  azithromycin (ZITHROMAX) 250 MG tablet, Take by mouth daily. 2 tabs day 1, then 1 tab daily until gone, Disp: , Rfl:  .  Calcium Carbonate-Vitamin D (CALCIUM 600+D) 600-200 MG-UNIT TABS, Take 1 tablet by mouth every morning. , Disp: , Rfl:  .  clorazepate (TRANXENE) 7.5 MG tablet, Take 7.5 mg by mouth at bedtime as needed for anxiety., Disp: , Rfl:  .  DILT-XR 120 MG 24 hr capsule, , Disp: , Rfl:  .  estradiol (ESTRACE VAGINAL) 0.1 MG/GM vaginal cream, Place 1 g vaginally 3 (three) times a week., Disp: 42.5 g, Rfl: 12 .  fluticasone (FLONASE) 50 MCG/ACT nasal spray, Place 2 sprays into both nostrils as needed for allergies or rhinitis. , Disp: , Rfl: 6 .  hyoscyamine (LEVBID) 0.375 MG 12 hr tablet, TAKE 1 TABLET (0.375 MG TOTAL) BY MOUTH 2 (TWO) TIMES DAILY., Disp: 60 tablet, Rfl: 2 .  hyoscyamine (LEVSIN, ANASPAZ) 0.125 MG tablet, , Disp: , Rfl:  .  irbesartan (AVAPRO) 150 MG tablet, Take 150 mg by mouth daily., Disp: , Rfl:  .  lidocaine (LIDODERM) 5 %, Place 1 patch onto the skin daily. Remove & Discard patch within 12 hours or as directed by MD, Disp: , Rfl:  .  linaclotide (LINZESS) 290 MCG CAPS capsule, Take 290 mcg by mouth daily., Disp: , Rfl:  .  loratadine (CLARITIN) 10 MG tablet, Take 10 mg by  mouth daily as needed for allergies., Disp: , Rfl:  .  metoprolol tartrate (LOPRESSOR) 25 MG tablet, Take 25 mg by mouth 2 (two) times daily., Disp: , Rfl:  .  mirtazapine (REMERON) 15 MG tablet, Take 7.5 mg by mouth at bedtime., Disp: , Rfl:  .  Multiple Vitamin (MULTIVITAMIN WITH MINERALS) TABS tablet, Take 1 tablet by mouth daily., Disp: , Rfl:  .  OXYGEN, Inhale 3 L into the lungs daily., Disp: , Rfl:  .  polyethylene glycol (MIRALAX / GLYCOLAX) packet, Take 17 g by mouth daily., Disp: ,  Rfl:  .  pravastatin (PRAVACHOL) 20 MG tablet, Take 20 mg by mouth every morning. , Disp: , Rfl:  .  traMADol (ULTRAM) 50 MG tablet, Take 50 mg by mouth 2 (two) times daily as needed for moderate pain. , Disp: , Rfl:  .  levofloxacin (LEVAQUIN) 500 MG tablet, Take 1 tablet (500 mg total) by mouth daily., Disp: 5 tablet, Rfl: 0

## 2016-08-20 NOTE — Assessment & Plan Note (Signed)
We discussed this again today. With her son present she stated that again that she desires no further CT scans and has no desire to undergo a biopsy.

## 2016-08-20 NOTE — Assessment & Plan Note (Signed)
She desires to have no further workup. No further CTs will be ordered.

## 2016-08-20 NOTE — Telephone Encounter (Signed)
°  New Prob   Pt reports she feels her "heart is racing" x 6 weeks. Requesting to speak to a nurse regarding possibly being seen prior to April 12 (first available for Dr. Burt Knack). Offered PA/NP but pt is requesting to only see Dr. Burt Knack. Please call.

## 2016-08-20 NOTE — Patient Instructions (Signed)
Take Levaquin 500 milligrams daily for 5 days We will call you with the results of the chest x-ray Keep using her oxygen and inhalers as you are doing We will refer you to cardiology for your elevated heart rate We will see you back in 3-4 months or sooner if needed

## 2016-08-20 NOTE — Telephone Encounter (Signed)
Patient stated when she runs for the phone or when she gets in a hurry that her HR goes up. Patient stated it gets as high as 110, but comes back down at rest. Patient stated she also get SOB due to her COPD. Informed patient that her heart rate should go up with activity, and go back down with rest. Informed patient if her HR does not come back down and stays elevated to give our office a call. Patient stated she will keep her appointment in April. Will forward to Dr. Burt Knack and his nurse for further advisement.

## 2016-08-20 NOTE — Telephone Encounter (Signed)
Left message for patient to call back  

## 2016-08-20 NOTE — Assessment & Plan Note (Signed)
Continue oxygen as prescribed 

## 2016-08-20 NOTE — Telephone Encounter (Signed)
Patient is returning your call.    Thanks

## 2016-08-20 NOTE — Assessment & Plan Note (Signed)
She appears to be having a very mild exacerbation which has been unresponsive to azithromycin. She is not wheezing on exam today. She does have some crackles in the left base which raises my suspicion for pneumonia. Given her COPD severity and previous exposure to antibiotics I think she needs something with more broad coverage and azithromycin.  Plan: Levaquin 5 days Stop azithromycin Chest x-ray to evaluate for pneumonia Continue broncho dilators as ordered

## 2016-08-23 NOTE — Telephone Encounter (Signed)
I spoke with the pt and she would like to proceed with 48 hour holter monitor.  Order placed and a scheduler will contact her with appointment.  I have scheduled the pt for follow-up with Dr Burt Knack 09/13/16.

## 2016-08-23 NOTE — Telephone Encounter (Signed)
Recommend 48 hour holter monitor and FU visit to follow

## 2016-08-27 NOTE — Telephone Encounter (Signed)
Pt scheduled for monitor placement 08/31/16.

## 2016-08-31 ENCOUNTER — Ambulatory Visit (INDEPENDENT_AMBULATORY_CARE_PROVIDER_SITE_OTHER): Payer: Medicare Other

## 2016-08-31 DIAGNOSIS — R000 Tachycardia, unspecified: Secondary | ICD-10-CM | POA: Diagnosis not present

## 2016-08-31 DIAGNOSIS — R0602 Shortness of breath: Secondary | ICD-10-CM

## 2016-09-06 ENCOUNTER — Encounter: Payer: Self-pay | Admitting: Cardiovascular Disease

## 2016-09-13 ENCOUNTER — Ambulatory Visit (INDEPENDENT_AMBULATORY_CARE_PROVIDER_SITE_OTHER): Payer: Medicare Other | Admitting: Cardiovascular Disease

## 2016-09-13 ENCOUNTER — Encounter: Payer: Self-pay | Admitting: Cardiovascular Disease

## 2016-09-13 VITALS — BP 136/72 | HR 68 | Ht 65.0 in | Wt 118.6 lb

## 2016-09-13 DIAGNOSIS — R000 Tachycardia, unspecified: Secondary | ICD-10-CM | POA: Diagnosis not present

## 2016-09-13 MED ORDER — DILTIAZEM HCL ER 240 MG PO CP24: 240.0000 mg | ORAL_CAPSULE | Freq: Every day | ORAL | 3 refills | Status: DC

## 2016-09-13 NOTE — Patient Instructions (Addendum)
Medication Instructions:  Your physician has recommended you make the following change in your medication:  1. STOP Amlodipine 2. INCREASE Diltiazem to 240mg  take one tablet by mouth daily   Labwork: No new orders.   Testing/Procedures: No new orders.   Follow-Up: Your physician wants you to follow-up in: 1 YEAR with Dr Burt Knack.  You will receive a reminder letter in the mail two months in advance. If you don't receive a letter, please call our office to schedule the follow-up appointment.   Any Other Special Instructions Will Be Listed Below (If Applicable).     If you need a refill on your cardiac medications before your next appointment, please call your pharmacy.

## 2016-09-15 ENCOUNTER — Other Ambulatory Visit: Payer: Self-pay | Admitting: *Deleted

## 2016-09-15 MED ORDER — DILTIAZEM HCL ER 240 MG PO CP24: 240.0000 mg | ORAL_CAPSULE | Freq: Every day | ORAL | 3 refills | Status: DC

## 2016-09-15 NOTE — Progress Notes (Signed)
Cardiology Office Note Date:  09/15/2016   ID:  Rachel Walters, DOB 02-May-1926, MRN [id number]  PCP:  Tivis Ringer, MD  Cardiologist:  Sherren Mocha, MD    Chief Complaint  Patient presents with  . Shortness of Breath     History of Present Illness: Rachel Walters is a 83 y.o. female who presents for follow-up of heart palpitations. She has chronic respiratory failure with O2 dependent COPD. She has recently noticed increased palpitations and increased heart rate. A Holter monitor is performed and shows sinus rhythm with episodes of pSVT and multifocal atrial tach.   She is compliant with her medications Reports no recent change in shortness of breath. No chest pain. Palpitations occur at rest. No associated lightheadedness/syncope.    Past Medical History:  Diagnosis Date  . Arthritis   . Asthmatic bronchitis   . Cancer ([place]) 04/2015   lung cancer   . Cervical dystonia   . Complete uterine prolapse   . COPD (chronic obstructive pulmonary disease) ([place])   . Dyspnea 04/23/2014  . Esophageal dysmotility   . Esophageal reflux 05/19/2012  . H. pylori infection   . Headache(784.0)    sinus  . Hiatal hernia   . History of home oxygen therapy    uses 3 liters oxygen all the time  . Hypertension   . ILD (interstitial lung disease) ([place]) 05/24/2014  . Leg DVT (deep venous thromboembolism), acute ([place]) 09/03/2014   left leg  . Neuromuscular disorder ([place])   . PONV (postoperative nausea and vomiting)    with neck surgery  . Presbyesophagus     Past Surgical History:  Procedure Laterality Date  . BALLOON DILATION  05/19/2012   Procedure: BALLOON DILATION;  Surgeon: Lafayette Dragon, MD;  Location: [facility];  Service: Endoscopy;  Laterality: N/A;  . COLONOSCOPY    . ESOPHAGOGASTRODUODENOSCOPY    . ESOPHAGOGASTRODUODENOSCOPY N/A 10/28/2014   Procedure: ESOPHAGOGASTRODUODENOSCOPY (EGD);  Surgeon: Lafayette Dragon, MD;  Location: [facility];  Service: Endoscopy;  Laterality:  N/A;  . HARDWARE REMOVAL Left 12/17/2014   Procedure: REMOVAL PLATE/SCREWS LEFT ELBOW;  Surgeon: Daryll Brod, MD;  Location: [facility];  Service: Orthopedics;  Laterality: Left;  . NECK SURGERY  1992 and 1993   spur removed  . SAVORY DILATION N/A 10/28/2014   Procedure: SAVORY DILATION;  Surgeon: Lafayette Dragon, MD;  Location: [facility];  Service: Endoscopy;  Laterality: N/A;    Current Outpatient Prescriptions  Medication Sig Dispense Refill  . Aclidinium Bromide (TUDORZA PRESSAIR) 400 MCG/ACT AEPB Inhale 1 puff into the lungs 2 (two) times daily. 3 each 3  . acyclovir (ZOVIRAX) 800 MG tablet Take 800 mg by mouth at bedtime.    . Calcium Carbonate-Vitamin D (CALCIUM 600+D) 600-200 MG-UNIT TABS Take 1 tablet by mouth every morning.     . clorazepate (TRANXENE) 7.5 MG tablet Take 7.5 mg by mouth at bedtime as needed for anxiety.    . fluticasone (FLONASE) 50 MCG/ACT nasal spray Place 2 sprays into both nostrils as needed for allergies or rhinitis.   6  . hyoscyamine (LEVSIN, ANASPAZ) 0.125 MG tablet     . irbesartan (AVAPRO) 150 MG tablet Take 150 mg by mouth daily.    Marland Kitchen lidocaine (LIDODERM) 5 % Place 1 patch onto the skin daily. Remove & Discard patch within 12 hours or as directed by MD    . linaclotide (LINZESS) 290 MCG CAPS capsule Take 290 mcg by mouth daily.    Marland Kitchen  loratadine (CLARITIN) 10 MG tablet Take 10 mg by mouth daily as needed for allergies.    . metoprolol tartrate (LOPRESSOR) 25 MG tablet Take 25 mg by mouth 2 (two) times daily.    . mirtazapine (REMERON) 15 MG tablet Take 7.5 mg by mouth at bedtime.    . Multiple Vitamin (MULTIVITAMIN WITH MINERALS) TABS tablet Take 1 tablet by mouth daily.    . OXYGEN Inhale 3 L into the lungs daily.    . polyethylene glycol (MIRALAX / GLYCOLAX) packet Take 17 g by mouth daily.    . pravastatin (PRAVACHOL) 20 MG tablet Take 20 mg by mouth every morning.     . traMADol (ULTRAM) 50 MG tablet Take 50 mg by mouth 2 (two) times  daily as needed for moderate pain.     Marland Kitchen diltiazem (DILT-XR) 240 MG 24 hr capsule Take 1 capsule (240 mg total) by mouth daily. 90 capsule 3   No current facility-administered medications for this visit.     Allergies:   Sulfa antibiotics and Morphine and related   Social History:  The patient  reports that she quit smoking about 5 years ago. Her smoking use included Cigarettes. She has a 25.00 pack-year smoking history. She has never used smokeless tobacco. She reports that she does not drink alcohol or use drugs.   Family History:  The patient's  family history includes Breast cancer in her sister; Diabetes in her maternal aunt, mother, and sister.    ROS:  Please see the history of present illness.  Otherwise, review of systems is positive for back pain, muscle pain, easy brusing.  All other systems are reviewed and negative.    PHYSICAL EXAM: VS:  BP 136/72   Pulse 68   Ht 5\' 5"  (1.651 m)   Wt 118 lb 9.6 oz (53.8 kg)   BMI 19.74 kg/m  , BMI Body mass index is 19.74 kg/m. GEN: Elderly pleasant woman, in no acute distress  HEENT: normal  Neck: no JVD, no masses. No carotid bruits Cardiac: RRR without murmur or gallop                Respiratory:  clear to auscultation bilaterally, normal work of breathing GI: soft, nontender, nondistended, + BS MS: no deformity or atrophy  Ext: no pretibial edema, pedal pulses 2+= bilaterally Skin: warm and dry, no rash Neuro:  Strength and sensation are intact Psych: euthymic mood, full affect  EKG:  EKG is not ordered today.  Recent Labs: No results found for requested labs within last 8760 hours.   Lipid Panel  No results found for: CHOL, TRIG, HDL, CHOLHDL, VLDL, LDLCALC, LDLDIRECT    Wt Readings from Last 3 Encounters:  09/13/16 118 lb 9.6 oz (53.8 kg)  08/20/16 119 lb (54 kg)  04/02/16 123 lb (55.8 kg)     Cardiac Studies Reviewed: Holter reviewed: episodes of MAT. No atrial fibrillation.  ASSESSMENT AND PLAN: 1.   Multifocal atrial tachycardia: pt understands relationship between her heart rhythm and chronic lung disease. Medications reviewed - recommended that she continue Toprol XL, stop amlodipine, and increase diltiazem to 240 mg daily. FU in one year unless problems arise.   2. HTN: BP contolled. Medication change as outlined above. Amlodipine to be discontinued since she is on diltiazem.   Current medicines are reviewed with the patient today.  The patient does not have concerns regarding medicines.  Labs/ tests ordered today include:  No orders of the defined types were placed in  this encounter.   Disposition:   FU one year  Signed, Sherren Mocha, MD  09/15/2016 10:20 PM    [facility] [address], [place], [place]  [address] Phone: [phone]; Fax: [phone]

## 2016-09-16 ENCOUNTER — Telehealth: Payer: Self-pay | Admitting: Cardiovascular Disease

## 2016-09-16 NOTE — Telephone Encounter (Signed)
I spoke with pt. Diltiazem was increased at recent office visit with Dr. Burt Knack.  Pt reports she slept OK last night but today she felt weak and nauseated when she got up. No vomiting but smell of food makes her nauseated. Also has pain in her upper back. This is not new.  Pt has had before and will occasionally take tylenol for this but she does not like to take pain medication. Pain is no worse today than in the past. Pain is worse when getting up and better when she lies down. No chest pain. Shortness of breath due to COPD but this is the same as usual.  Heart rate in the 80's.  I told pt this does not sound Diltiazem related.  I asked her to follow up with primary care if symptoms continue.  I told her I would make Dr.Cooper aware and we would call her if he wanted to make any changes.

## 2016-09-16 NOTE — Telephone Encounter (Signed)
New Message    Pt c/o medication issue:  1. Name of Medication:  Diltiazem   2. How are you currently taking this medication (dosage and times per day)? 2 at bed time  3. Are you having a reaction (difficulty breathing--STAT)? Pain in shoulder blades   4. What is your medication issue? Medication is making her very tired , sick to the stomach

## 2016-09-16 NOTE — Telephone Encounter (Signed)
Agree thank you 

## 2016-09-17 NOTE — Telephone Encounter (Signed)
Left message for pt to call.

## 2016-09-17 NOTE — Telephone Encounter (Signed)
Spoke with pt, she is feeling a lot better and not having any other problems.

## 2016-11-04 ENCOUNTER — Ambulatory Visit: Payer: Medicare Other | Admitting: Cardiovascular Disease

## 2016-12-17 ENCOUNTER — Encounter: Payer: Self-pay | Admitting: Pulmonary Disease

## 2016-12-17 ENCOUNTER — Ambulatory Visit (INDEPENDENT_AMBULATORY_CARE_PROVIDER_SITE_OTHER): Payer: Medicare Other | Admitting: Pulmonary Disease

## 2016-12-17 VITALS — BP 134/76 | HR 97 | Ht 65.0 in | Wt 117.0 lb

## 2016-12-17 DIAGNOSIS — J430 Unilateral pulmonary emphysema [MacLeod's syndrome]: Secondary | ICD-10-CM

## 2016-12-17 DIAGNOSIS — J9611 Chronic respiratory failure with hypoxia: Secondary | ICD-10-CM | POA: Diagnosis not present

## 2016-12-17 DIAGNOSIS — R911 Solitary pulmonary nodule: Secondary | ICD-10-CM | POA: Diagnosis not present

## 2016-12-17 DIAGNOSIS — R0602 Shortness of breath: Secondary | ICD-10-CM

## 2016-12-17 MED ORDER — ALBUTEROL SULFATE HFA 108 (90 BASE) MCG/ACT IN AERS: 2.0000 | INHALATION_SPRAY | Freq: Four times a day (QID) | RESPIRATORY_TRACT | 3 refills | Status: DC | PRN

## 2016-12-17 MED ORDER — ALBUTEROL SULFATE HFA 108 (90 BASE) MCG/ACT IN AERS: 2.0000 | INHALATION_SPRAY | Freq: Four times a day (QID) | RESPIRATORY_TRACT | 5 refills | Status: DC | PRN

## 2016-12-17 NOTE — Progress Notes (Signed)
Subjective:    Patient ID: Rachel Walters, female    DOB: Dec 05, 1925, 83 y.o.   MRN: [id number]  Synopsis: GOLD grade D COPD first seen by pulmonary in 2015 04/2014 Echo> normal LVEF, grade 1 DD, normal RV and RVSP 04/23/2014 Spirometry> 56% FEV1 0.84L (45% pred) Started on 2 L oxygen with exertion and each bedtime in 2015  HPI Chief Complaint  Patient presents with  . Follow-up    pt c/o increased wheezing, sob with exertion Xfew days.  denies CP, mucus production.     Javonne says that she has noticed that today she is feeling a little more short of breath.  She thinks it is related to the weather. No fever, chills or sick contacts.  This has been worse this week.    She has lost a few pounds.  She has a poor appetite.    She was told that she had macular degeneration in her eye this week.    She still tries to walk every day.     Past Medical History:  Diagnosis Date  . Arthritis   . Asthmatic bronchitis   . Cancer ([place]) 04/2015   lung cancer   . Cervical dystonia   . Complete uterine prolapse   . COPD (chronic obstructive pulmonary disease) ([place])   . Dyspnea 04/23/2014  . Esophageal dysmotility   . Esophageal reflux 05/19/2012  . H. pylori infection   . Headache(784.0)    sinus  . Hiatal hernia   . History of home oxygen therapy    uses 3 liters oxygen all the time  . Hypertension   . ILD (interstitial lung disease) ([place]) 05/24/2014  . Leg DVT (deep venous thromboembolism), acute ([place]) 09/03/2014   left leg  . Neuromuscular disorder ([place])   . PONV (postoperative nausea and vomiting)    with neck surgery  . Presbyesophagus      Review of Systems  Constitutional: Positive for fatigue. Negative for chills and fever.  HENT: Positive for rhinorrhea and sinus pressure. Negative for postnasal drip.   Respiratory: Positive for cough and shortness of breath. Negative for wheezing.   Cardiovascular: Negative for chest pain, palpitations and leg swelling.         Objective:   Physical Exam  Vitals:   12/17/16 1330  BP: 134/76  Pulse: 97  SpO2: 97%  Weight: 117 lb (53.1 kg)  Height: 5\' 5"  (1.651 m)  2L [place]  Gen: chronically ill appearing HENT: OP clear, TM's clear, neck supple PULM: Poor air movement some crackles bases, normal percussion CV: RRR, no mgr, trace edema GI: BS+, soft, nontender Derm: no cyanosis or rash Psyche: normal mood and affect   February 2018 cardiology records reviewed were she was cared for atrial fibrillation with diltiazem and metoprolol  BMET    Component Value Date/Time   NA 138 12/17/2014 1433   K 4.1 12/17/2014 1433   CL 95 (L) 12/17/2014 1433   CO2 26 09/06/2014 0519   GLUCOSE 91 12/17/2014 1433   BUN 20 12/17/2014 1433   CREATININE 1.00 12/17/2014 1433   CALCIUM 8.4 09/06/2014 0519   GFRNONAA 59 (L) 09/06/2014 0519   GFRAA 69 (L) 09/06/2014 0519        Assessment & Plan:   Pulmonary nodule, right She has chosen on multiple occasions to not undergo a biopsy of this lesion. However, considering her worsening dyspnea today and the possibility that this nodule could represent malignancy I like to get a chest x-ray  to ensure that nothing more worrisome has developed.  COPD (chronic obstructive pulmonary disease) (HCC) She's had some worsening symptoms with the change in weather recently but does not appear to have an exacerbation today.  Plan: Continue Tudorza Refill albuterol to use as needed Let us know if symptoms do not improve  Chronic hypoxemic respiratory failure (HCC) Continue oxygen 2 L continuously    Current Outpatient Prescriptions:  .  Aclidinium Bromide (TUDORZA PRESSAIR) 400 MCG/ACT AEPB, Inhale 1 puff into the lungs 2 (two) times daily., Disp: 3 each, Rfl: 3 .  acyclovir (ZOVIRAX) 800 MG tablet, Take 800 mg by mouth at bedtime., Disp: , Rfl:  .  Calcium Carbonate-Vitamin D (CALCIUM 600+D) 600-200 MG-UNIT TABS, Take 1 tablet by mouth every morning. , Disp: , Rfl:  .   clorazepate (TRANXENE) 7.5 MG tablet, Take 7.5 mg by mouth at bedtime as needed for anxiety., Disp: , Rfl:  .  diltiazem (DILT-XR) 240 MG 24 hr capsule, Take 1 capsule (240 mg total) by mouth daily., Disp: 90 capsule, Rfl: 3 .  fluticasone (FLONASE) 50 MCG/ACT nasal spray, Place 2 sprays into both nostrils as needed for allergies or rhinitis. , Disp: , Rfl: 6 .  hyoscyamine (LEVSIN, ANASPAZ) 0.125 MG tablet, , Disp: , Rfl:  .  irbesartan (AVAPRO) 150 MG tablet, Take 150 mg by mouth daily., Disp: , Rfl:  .  lidocaine (LIDODERM) 5 %, Place 1 patch onto the skin daily. Remove & Discard patch within 12 hours or as directed by MD, Disp: , Rfl:  .  linaclotide (LINZESS) 290 MCG CAPS capsule, Take 290 mcg by mouth daily., Disp: , Rfl:  .  loratadine (CLARITIN) 10 MG tablet, Take 10 mg by mouth daily as needed for allergies., Disp: , Rfl:  .  metoprolol tartrate (LOPRESSOR) 25 MG tablet, Take 25 mg by mouth 2 (two) times daily., Disp: , Rfl:  .  mirtazapine (REMERON) 15 MG tablet, Take 7.5 mg by mouth at bedtime., Disp: , Rfl:  .  Multiple Vitamin (MULTIVITAMIN WITH MINERALS) TABS tablet, Take 1 tablet by mouth daily., Disp: , Rfl:  .  OXYGEN, Inhale 3 L into the lungs daily., Disp: , Rfl:  .  polyethylene glycol (MIRALAX / GLYCOLAX) packet, Take 17 g by mouth daily., Disp: , Rfl:  .  pravastatin (PRAVACHOL) 20 MG tablet, Take 20 mg by mouth every morning. , Disp: , Rfl:  .  traMADol (ULTRAM) 50 MG tablet, Take 50 mg by mouth 2 (two) times daily as needed for moderate pain. , Disp: , Rfl:  .  albuterol (PROVENTIL HFA;VENTOLIN HFA) 108 (90 Base) MCG/ACT inhaler, Inhale 2 puffs into the lungs every 6 (six) hours as needed for wheezing or shortness of breath., Disp: 3 Inhaler, Rfl: 3

## 2016-12-17 NOTE — Assessment & Plan Note (Signed)
She's had some worsening symptoms with the change in weather recently but does not appear to have an exacerbation today.  Plan: Continue Tudorza Refill albuterol to use as needed Let us know if symptoms do not improve

## 2016-12-17 NOTE — Patient Instructions (Signed)
We will call you with the results of the chest x-ray Use albuterol 2 puffs every 4-6 hours as needed for chest tightness or shortness of breath Keep taking Caprice Renshaw as you are doing We will see you back in 4 months percent if needed

## 2016-12-17 NOTE — Assessment & Plan Note (Signed)
Continue oxygen 2 L continuously

## 2016-12-17 NOTE — Assessment & Plan Note (Signed)
She has chosen on multiple occasions to not undergo a biopsy of this lesion. However, considering her worsening dyspnea today and the possibility that this nodule could represent malignancy I like to get a chest x-ray to ensure that nothing more worrisome has developed.

## 2017-02-10 ENCOUNTER — Telehealth: Payer: Self-pay | Admitting: Pulmonary Disease

## 2017-02-10 NOTE — Telephone Encounter (Signed)
Spoke with the patient. She is needing to have the hole in her macular to be repaired. This procedure will require her to have to lay face down for several days in order for the gas bubble to take effect. She is concerned about doing this since she is currently on oxygen and wants to know BQ's thoughts on this.   BQ, please advise. Thanks!

## 2017-02-14 NOTE — Telephone Encounter (Signed)
[phone] Patient is returning phone call

## 2017-02-14 NOTE — Telephone Encounter (Signed)
Called and spoke with pt and she is aware of BQ recs for the eye surgery.  Nothing further is needed.

## 2017-02-14 NOTE — Telephone Encounter (Signed)
LM x 1 

## 2017-02-14 NOTE — Telephone Encounter (Signed)
Shouldn't be a problem.

## 2017-04-15 ENCOUNTER — Encounter: Payer: Self-pay | Admitting: Pulmonary Disease

## 2017-04-15 ENCOUNTER — Ambulatory Visit (INDEPENDENT_AMBULATORY_CARE_PROVIDER_SITE_OTHER): Payer: Medicare Other | Admitting: Pulmonary Disease

## 2017-04-15 ENCOUNTER — Ambulatory Visit (INDEPENDENT_AMBULATORY_CARE_PROVIDER_SITE_OTHER)
Admission: RE | Admit: 2017-04-15 | Discharge: 2017-04-15 | Disposition: A | Discharge status: Home / Self Care | Payer: Medicare Other | Source: Ambulatory Visit | Attending: Pulmonary Disease | Admitting: Pulmonary Disease

## 2017-04-15 VITALS — BP 130/64 | HR 76 | Ht 65.0 in | Wt 124.0 lb

## 2017-04-15 DIAGNOSIS — J9611 Chronic respiratory failure with hypoxia: Secondary | ICD-10-CM | POA: Diagnosis not present

## 2017-04-15 DIAGNOSIS — J432 Centrilobular emphysema: Secondary | ICD-10-CM | POA: Diagnosis not present

## 2017-04-15 DIAGNOSIS — R911 Solitary pulmonary nodule: Secondary | ICD-10-CM | POA: Diagnosis not present

## 2017-04-15 DIAGNOSIS — Z23 Encounter for immunization: Secondary | ICD-10-CM

## 2017-04-15 MED ORDER — ACLIDINIUM BROMIDE 400 MCG/ACT IN AEPB: 1.0000 | INHALATION_SPRAY | Freq: Two times a day (BID) | RESPIRATORY_TRACT | 0 refills | Status: DC

## 2017-04-15 NOTE — Progress Notes (Signed)
Subjective:    Patient ID: Rachel Walters, female    DOB: 03/01/1926, 83 y.o.   MRN: [id number]  Synopsis: GOLD grade D COPD first seen by pulmonary in 2015 Started on 2 L oxygen with exertion and each bedtime in 2015  HPI Chief Complaint  Patient presents with  . Follow-up    pt c/o stable sob with exertion, weather can influence this per pt.     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  She saw a retina specialist and she had some surgery.  She continues to have some dyspnea when she exerts herself.  No bronchitis or pneumonia. No prednisone or antibiotics.  She still feel strongly that she doesn't want to have a biopsy.      Past Medical History:  Diagnosis Date  . Arthritis   . Asthmatic bronchitis   . Cancer ([place]) 04/2015   lung cancer   . Cervical dystonia   . Complete uterine prolapse   . COPD (chronic obstructive pulmonary disease) ([place])   . Dyspnea 04/23/2014  . Esophageal dysmotility   . Esophageal reflux 05/19/2012  . H. pylori infection   . Headache(784.0)    sinus  . Hiatal hernia   . History of home oxygen therapy    uses 3 liters oxygen all the time  . Hypertension   . ILD (interstitial lung disease) ([place]) 05/24/2014  . Leg DVT (deep venous thromboembolism), acute ([place]) 09/03/2014   left leg  . Neuromuscular disorder ([place])   . PONV (postoperative nausea and vomiting)    with neck surgery  . Presbyesophagus      Review of Systems  Constitutional: Positive for fatigue. Negative for chills and fever.  HENT: Positive for rhinorrhea and sinus pressure. Negative for postnasal drip.   Respiratory: Positive for cough and shortness of breath. Negative for wheezing.   Cardiovascular: Negative for chest pain, palpitations and leg swelling.       Objective:   Physical Exam  Vitals:   04/15/17 1327  BP: 130/64  Pulse: 76  SpO2: 97%  Weight: 124 lb (56.2 kg)  Height: 5\' 5"  (1.651 m)  2L [place]  Gen:  chronically ill appearing in a wheelchair HENT: OP clear, TM's clear, neck supple PULM: Few crackles bases B, normal percussion CV: RRR, no mgr, trace edema GI: BS+, soft, nontender Derm: no cyanosis or rash Psyche: normal mood and affect    February 2018 cardiology records reviewed were she was cared for atrial fibrillation with diltiazem and metoprolol  BMET    Component Value Date/Time   NA 138 12/17/2014 1433   K 4.1 12/17/2014 1433   CL 95 (L) 12/17/2014 1433   CO2 26 09/06/2014 0519   GLUCOSE 91 12/17/2014 1433   BUN 20 12/17/2014 1433   CREATININE 1.00 12/17/2014 1433   CALCIUM 8.4 09/06/2014 0519   GFRNONAA 59 (L) 09/06/2014 0519   GFRAA 69 (L) 09/06/2014 0519   Echo: 04/2014 Echo> normal LVEF, grade 1 DD, normal RV and RVSP  PFT: 04/23/2014 Spirometry> 56% FEV1 0.84L (45% pred)     Assessment & Plan:   Pulmonary nodule, right - Plan: DG Chest 2 View  Chronic hypoxemic respiratory failure (HCC)  Centrilobular emphysema ([place])  Discussion: This is been a stable interval for [place]. She has not had an exacerbation of her COPD since the last visit. She continues to use and benefit from her oxygen. She still states that she  does not want to consider a biopsy for the likely malignant nodule in her right lung.  Plan: For the lung nodule: We will check a chest x-ray  For COPD: ContinueTudorza  For chronic respiratory failure with hypoxemia: Continue 2 L [place]  We will see you back in 4-6 months or sooner if needed    Current Outpatient Prescriptions:  .  Aclidinium Bromide (TUDORZA PRESSAIR) 400 MCG/ACT AEPB, Inhale 1 puff into the lungs 2 (two) times daily., Disp: 3 each, Rfl: 3 .  acyclovir (ZOVIRAX) 800 MG tablet, Take 800 mg by mouth at bedtime., Disp: , Rfl:  .  albuterol (PROVENTIL HFA;VENTOLIN HFA) 108 (90 Base) MCG/ACT inhaler, Inhale 2 puffs into the lungs every 6 (six) hours as needed for wheezing or shortness of breath., Disp: 1 Inhaler, Rfl: 5 .   Calcium Carbonate-Vitamin D (CALCIUM 600+D) 600-200 MG-UNIT TABS, Take 1 tablet by mouth every morning. , Disp: , Rfl:  .  clorazepate (TRANXENE) 7.5 MG tablet, Take 7.5 mg by mouth at bedtime as needed for anxiety., Disp: , Rfl:  .  diltiazem (DILT-XR) 240 MG 24 hr capsule, Take 1 capsule (240 mg total) by mouth daily., Disp: 90 capsule, Rfl: 3 .  fluticasone (FLONASE) 50 MCG/ACT nasal spray, Place 2 sprays into both nostrils as needed for allergies or rhinitis. , Disp: , Rfl: 6 .  hyoscyamine (LEVSIN, ANASPAZ) 0.125 MG tablet, , Disp: , Rfl:  .  irbesartan (AVAPRO) 150 MG tablet, Take 150 mg by mouth daily., Disp: , Rfl:  .  lidocaine (LIDODERM) 5 %, Place 1 patch onto the skin daily. Remove & Discard patch within 12 hours or as directed by MD, Disp: , Rfl:  .  loratadine (CLARITIN) 10 MG tablet, Take 10 mg by mouth daily as needed for allergies., Disp: , Rfl:  .  metoprolol tartrate (LOPRESSOR) 25 MG tablet, Take 25 mg by mouth 2 (two) times daily., Disp: , Rfl:  .  mirtazapine (REMERON) 15 MG tablet, Take 7.5 mg by mouth at bedtime., Disp: , Rfl:  .  Multiple Vitamin (MULTIVITAMIN WITH MINERALS) TABS tablet, Take 1 tablet by mouth daily., Disp: , Rfl:  .  OXYGEN, Inhale 3 L into the lungs daily., Disp: , Rfl:  .  polyethylene glycol (MIRALAX / GLYCOLAX) packet, Take 17 g by mouth daily., Disp: , Rfl:  .  pravastatin (PRAVACHOL) 20 MG tablet, Take 20 mg by mouth every morning. , Disp: , Rfl:  .  traMADol (ULTRAM) 50 MG tablet, Take 50 mg by mouth 2 (two) times daily as needed for moderate pain. , Disp: , Rfl:

## 2017-04-15 NOTE — Patient Instructions (Addendum)
For the lung nodule: We will check a chest x-ray  For COPD: ContinueTudorza  For chronic respiratory failure with hypoxemia: Continue 2 L [place]  We will see you back in 4-6 months or sooner if needed

## 2017-04-19 ENCOUNTER — Telehealth: Payer: Self-pay | Admitting: Pulmonary Disease

## 2017-04-19 NOTE — Telephone Encounter (Signed)
Notes recorded by Juanito Doom, MD on 04/18/2017 at 9:36 AM EDT A, Please let the patient know this didn't show any change in the mass.  Advised pt of results. Pt understood and nothing further is needed.

## 2017-07-26 ENCOUNTER — Encounter: Payer: Self-pay | Admitting: Gastroenterology

## 2017-09-30 ENCOUNTER — Telehealth: Payer: Self-pay | Admitting: Pulmonary Disease

## 2017-09-30 NOTE — Telephone Encounter (Signed)
I have spoken to [facility] with Hospice. Cristela Blue wanted to make BQ aware that pt is under hospice care. Cristela Blue is requesting that our office coordinate with Hospice regarding pt care. She also states that pt has requested neb treatments. Hospice provider stated that this would be up to Dr. [place] Bells.  Pt has upcoming apt with BQ on 10/04/17.  Will route to BQ and Caryl Pina to make aware. Thanks

## 2017-10-03 NOTE — Telephone Encounter (Signed)
OK to help as needed Would recommend duoneb qid

## 2017-10-03 NOTE — Telephone Encounter (Signed)
Spoke with Cristela Blue and advised message from BQ. Pt has an appt tomorrow and would like BQ to write for the duoneb. AC can you make sure this gets done. Thanks.

## 2017-10-04 ENCOUNTER — Ambulatory Visit (INDEPENDENT_AMBULATORY_CARE_PROVIDER_SITE_OTHER): Admitting: Pulmonary Disease

## 2017-10-04 ENCOUNTER — Ambulatory Visit (INDEPENDENT_AMBULATORY_CARE_PROVIDER_SITE_OTHER)
Admission: RE | Admit: 2017-10-04 | Discharge: 2017-10-04 | Disposition: A | Discharge status: Home / Self Care | Source: Ambulatory Visit | Attending: Pulmonary Disease | Admitting: Pulmonary Disease

## 2017-10-04 VITALS — BP 118/76 | HR 78

## 2017-10-04 DIAGNOSIS — J9611 Chronic respiratory failure with hypoxia: Secondary | ICD-10-CM | POA: Diagnosis not present

## 2017-10-04 DIAGNOSIS — M545 Low back pain: Secondary | ICD-10-CM

## 2017-10-04 MED ORDER — ACLIDINIUM BROMIDE 400 MCG/ACT IN AEPB: 1.0000 | INHALATION_SPRAY | Freq: Two times a day (BID) | RESPIRATORY_TRACT | 0 refills | Status: DC

## 2017-10-04 NOTE — Progress Notes (Signed)
Subjective:    Patient ID: Rachel Walters, female    DOB: March 18, 1926, 84 y.o.   MRN: [id number]  Synopsis: GOLD grade D COPD first seen by pulmonary in 2015 Started on 2 L oxygen with exertion and each bedtime in 2015  HPI Chief Complaint  Patient presents with  . Follow-up    Would like Duo ned,SOB , Productive cough yellow in color.    Rachel Walters says that suddenly one day she passed out and "felt odd" and felt some numbness in her R arm and was confused.  She went to the ER for evaluation.  The symptoms came on suddenly.  She was more short of breath and wheezing a lot.  She was coughing. She was told she had bronchitis.  She had a CXR that day.  She has been on 3L O2 lately.    She still feels pretty poorly.  She has been having more back pain in the lumbar area.     Past Medical History:  Diagnosis Date  . Arthritis   . Asthmatic bronchitis   . Cancer ([place]) 04/2015   lung cancer   . Cervical dystonia   . Complete uterine prolapse   . COPD (chronic obstructive pulmonary disease) ([place])   . Dyspnea 04/23/2014  . Esophageal dysmotility   . Esophageal reflux 05/19/2012  . H. pylori infection   . Headache(784.0)    sinus  . Hiatal hernia   . History of home oxygen therapy    uses 3 liters oxygen all the time  . Hypertension   . ILD (interstitial lung disease) ([place]) 05/24/2014  . Leg DVT (deep venous thromboembolism), acute ([place]) 09/03/2014   left leg  . Neuromuscular disorder ([place])   . PONV (postoperative nausea and vomiting)    with neck surgery  . Presbyesophagus      Review of Systems  Constitutional: Positive for fatigue. Negative for chills and fever.  HENT: Positive for rhinorrhea and sinus pressure. Negative for postnasal drip.   Respiratory: Positive for cough and shortness of breath. Negative for wheezing.   Cardiovascular: Negative for chest pain, palpitations and leg swelling.       Objective:   Physical Exam  Vitals:   10/04/17 1454  BP: 118/76    Pulse: 78  SpO2: 96%  3L [place]  Gen: elderly, chronically ill appearing HENT: OP clear, TM's clear, neck supple PULM: Crackles bases B, normal percussion CV: RRR, no mgr, trace edema GI: BS+, soft, nontender Derm: no cyanosis or rash Psyche: normal mood and affect    Chest imaging: March 2019 chest x-ray from the [facility] emergency department reviewed showing emphysema, stable nodule in the right lung  February 2018 cardiology records reviewed were she was cared for atrial fibrillation with diltiazem and metoprolol  BMET    Component Value Date/Time   NA 138 12/17/2014 1433   K 4.1 12/17/2014 1433   CL 95 (L) 12/17/2014 1433   CO2 26 09/06/2014 0519   GLUCOSE 91 12/17/2014 1433   BUN 20 12/17/2014 1433   CREATININE 1.00 12/17/2014 1433   CALCIUM 8.4 09/06/2014 0519   GFRNONAA 59 (L) 09/06/2014 0519   GFRAA 69 (L) 09/06/2014 0519   Echo: 04/2014 Echo> normal LVEF, grade 1 DD, normal RV and RVSP  PFT: 04/23/2014 Spirometry> 56% FEV1 0.84L (45% pred)     Assessment & Plan:   No diagnosis found.  Discussion: Rachel Walters has been doing poorly lately.  She is now on hospice.  Unfortunately she  is not taking the Rachel Walters because it is too expensive.  We will prescribe a DuoNeb for her to use 3-4 times a day.  We will also give her some samples of Rachel Walters today.  In terms of her chronic respiratory failure she is continuing to use and benefit from her oxygen at 3 L/min but she says that the tanks are too big.  She like a prescription for smaller ones.  In addition to all of this she has been having some fairly severe low back pain.  Given the history of the lung nodule and her prior history of a compression fracture I think either 1 of these could be possible.  I agree with her being on hospice  Plan: Low back pain: We will check a chest x-ray to make sure there is no evidence of a compression fracture or other problem  Severe COPD: Use the DuoNeb 3-4 times a day Take Rachel Walters  twice a day, samples given today  Chronic respiratory failure with hypoxemia: Continue 3 L of oxygen continuously, we will send a prescription for smaller tanks  Pulmonary nodule: I reviewed the chest x-ray from your visit to the emergency room recently and I did not see any major complications from this  We will see you back in about 8 weeks or sooner if needed    Current Outpatient Medications:  .  acyclovir (ZOVIRAX) 800 MG tablet, Take 800 mg by mouth at bedtime., Disp: , Rfl:  .  albuterol (PROVENTIL HFA;VENTOLIN HFA) 108 (90 Base) MCG/ACT inhaler, Inhale 2 puffs into the lungs every 6 (six) hours as needed for wheezing or shortness of breath., Disp: 1 Inhaler, Rfl: 5 .  Calcium Carbonate-Vitamin D (CALCIUM 600+D) 600-200 MG-UNIT TABS, Take 1 tablet by mouth every morning. , Disp: , Rfl:  .  clorazepate (TRANXENE) 7.5 MG tablet, Take 7.5 mg by mouth at bedtime as needed for anxiety., Disp: , Rfl:  .  diltiazem (DILT-XR) 240 MG 24 hr capsule, Take 1 capsule (240 mg total) by mouth daily., Disp: 90 capsule, Rfl: 3 .  irbesartan (AVAPRO) 150 MG tablet, Take 150 mg by mouth daily., Disp: , Rfl:  .  lidocaine (LIDODERM) 5 %, Place 1 patch onto the skin daily. Remove & Discard patch within 12 hours or as directed by MD, Disp: , Rfl:  .  loratadine (CLARITIN) 10 MG tablet, Take 10 mg by mouth daily as needed for allergies., Disp: , Rfl:  .  metoprolol tartrate (LOPRESSOR) 25 MG tablet, Take 25 mg by mouth 2 (two) times daily., Disp: , Rfl:  .  mirtazapine (REMERON) 15 MG tablet, Take 7.5 mg by mouth at bedtime., Disp: , Rfl:  .  Multiple Vitamin (MULTIVITAMIN WITH MINERALS) TABS tablet, Take 1 tablet by mouth daily., Disp: , Rfl:  .  OXYGEN, Inhale 3 L into the lungs daily., Disp: , Rfl:  .  polyethylene glycol (MIRALAX / GLYCOLAX) packet, Take 17 g by mouth daily., Disp: , Rfl:  .  pravastatin (PRAVACHOL) 20 MG tablet, Take 20 mg by mouth every morning. , Disp: , Rfl:  .  traMADol (ULTRAM)  50 MG tablet, Take 50 mg by mouth 2 (two) times daily as needed for moderate pain. , Disp: , Rfl:  .  Aclidinium Bromide (Rachel Walters PRESSAIR) 400 MCG/ACT AEPB, Inhale 1 puff into the lungs 2 (two) times daily. (Patient not taking: Reported on 10/04/2017), Disp: 3 each, Rfl: 3 .  fluticasone (FLONASE) 50 MCG/ACT nasal spray, Place 2 sprays into both nostrils  as needed for allergies or rhinitis. , Disp: , Rfl: 6

## 2017-10-04 NOTE — Telephone Encounter (Signed)
This was discussed in office visit today.

## 2017-10-04 NOTE — Patient Instructions (Signed)
Low back pain: We will check a chest x-ray to make sure there is no evidence of a compression fracture or other problem  Severe COPD: Use the DuoNeb 3-4 times a day Take [place] twice a day, samples given today  Chronic respiratory failure with hypoxemia: Continue 3 L of oxygen continuously, we will send a prescription for smaller tanks  Pulmonary nodule: I reviewed the chest x-ray from your visit to the emergency room recently and I did not see any major complications from this  We will see you back in about 8 weeks or sooner if needed

## 2017-10-05 ENCOUNTER — Telehealth: Payer: Self-pay | Admitting: Pulmonary Disease

## 2017-10-05 NOTE — Telephone Encounter (Signed)
OV notes have been faxed to [facility]. Nothing further was needed.

## 2017-10-06 ENCOUNTER — Other Ambulatory Visit: Payer: Self-pay | Admitting: Cardiovascular Disease

## 2017-10-06 MED ORDER — DILTIAZEM HCL ER 240 MG PO CP24: 240.0000 mg | ORAL_CAPSULE | Freq: Every day | ORAL | 0 refills | Status: AC

## 2017-10-06 NOTE — Telephone Encounter (Signed)
New Message     *STAT* If patient is at the pharmacy, call can be transferred to refill team.   1. Which medications need to be refilled? (please list name of each medication and dose if known)  diltiazem (DILT-XR) 240 MG 24 hr capsule Take 1 capsule (240 mg total) by mouth daily.     2. Which pharmacy/location (including street and city if local pharmacy) is medication to be sent to? [facility] [address] [place]   3. Do they need a 30 day or 90 day supply? [place]

## 2017-10-06 NOTE — Telephone Encounter (Signed)
Pt's medication was sent to pt's pharmacy as requested. Confirmation received.  °

## 2017-10-07 ENCOUNTER — Encounter: Payer: Self-pay | Admitting: Cardiovascular Disease

## 2017-10-07 ENCOUNTER — Ambulatory Visit (INDEPENDENT_AMBULATORY_CARE_PROVIDER_SITE_OTHER): Admitting: Cardiovascular Disease

## 2017-10-07 VITALS — BP 128/70 | HR 85 | Ht 65.0 in | Wt 118.0 lb

## 2017-10-07 DIAGNOSIS — I471 Supraventricular tachycardia: Secondary | ICD-10-CM

## 2017-10-07 DIAGNOSIS — I10 Essential (primary) hypertension: Secondary | ICD-10-CM

## 2017-10-07 NOTE — Patient Instructions (Signed)

## 2017-10-07 NOTE — Progress Notes (Signed)
Cardiology Office Note Date:  10/07/2017   ID:  [name], DOB [birth year], MRN [id number]  PCP:  Prince Solian, MD  Cardiologist:  Sherren Mocha, MD    Chief Complaint  Patient presents with  . Shortness of Breath   History of Present Illness: Rachel Walters is a 84 y.o. female who presents for follow-up of heart palpitations.  The patient has chronic respiratory failure and O2 dependent COPD.  The patient is here with her son today.  She is been doing well.  She still lives independently but has some help with 2 meals prepared for her each day.  She is now on hospice but apparently very stable.  She had a viral illness earlier this year and became quite ill with that.  She has recovered well and is back to her baseline.  She denies any recent heart palpitations.  She has had no chest pain, lightheadedness, or syncope.  Past Medical History:  Diagnosis Date  . Arthritis   . Asthmatic bronchitis   . Cancer ([place]) 04/2015   lung cancer   . Cervical dystonia   . Complete uterine prolapse   . COPD (chronic obstructive pulmonary disease) ([place])   . Dyspnea 04/23/2014  . Esophageal dysmotility   . Esophageal reflux 05/19/2012  . H. pylori infection   . Headache(784.0)    sinus  . Hiatal hernia   . History of home oxygen therapy    uses 3 liters oxygen all the time  . Hypertension   . ILD (interstitial lung disease) ([place]) 05/24/2014  . Leg DVT (deep venous thromboembolism), acute ([place]) 09/03/2014   left leg  . Neuromuscular disorder ([place])   . PONV (postoperative nausea and vomiting)    with neck surgery  . Presbyesophagus     Past Surgical History:  Procedure Laterality Date  . BALLOON DILATION  05/19/2012   Procedure: BALLOON DILATION;  Surgeon: Lafayette Dragon, MD;  Location: [facility];  Service: Endoscopy;  Laterality: N/A;  . COLONOSCOPY    . ESOPHAGOGASTRODUODENOSCOPY    . ESOPHAGOGASTRODUODENOSCOPY N/A 10/28/2014   Procedure: ESOPHAGOGASTRODUODENOSCOPY (EGD);   Surgeon: Lafayette Dragon, MD;  Location: [facility];  Service: Endoscopy;  Laterality: N/A;  . HARDWARE REMOVAL Left 12/17/2014   Procedure: REMOVAL PLATE/SCREWS LEFT ELBOW;  Surgeon: Daryll Brod, MD;  Location: [facility];  Service: Orthopedics;  Laterality: Left;  . NECK SURGERY  1992 and 1993   spur removed  . SAVORY DILATION N/A 10/28/2014   Procedure: SAVORY DILATION;  Surgeon: Lafayette Dragon, MD;  Location: [facility];  Service: Endoscopy;  Laterality: N/A;    Current Outpatient Medications  Medication Sig Dispense Refill  . Aclidinium Bromide (TUDORZA PRESSAIR) 400 MCG/ACT AEPB Inhale 1 puff into the lungs 2 (two) times daily. 3 each 0  . acyclovir (ZOVIRAX) 800 MG tablet Take 800 mg by mouth at bedtime.    Marland Kitchen albuterol (PROVENTIL HFA;VENTOLIN HFA) 108 (90 Base) MCG/ACT inhaler Inhale 2 puffs into the lungs every 6 (six) hours as needed for wheezing or shortness of breath. 1 Inhaler 5  . Calcium Carbonate-Vitamin D (CALCIUM 600+D) 600-200 MG-UNIT TABS Take 1 tablet by mouth every morning.     . clorazepate (TRANXENE) 7.5 MG tablet Take 7.5 mg by mouth at bedtime as needed for anxiety.    Marland Kitchen DILT-XR 120 MG 24 hr capsule Take 120 mg by mouth at bedtime.    Marland Kitchen diltiazem (DILT-XR) 240 MG 24 hr capsule Take 1 capsule (240 mg  total) by mouth daily. Please keep upcoming appt for future refills. Thank you 30 capsule 0  . escitalopram (LEXAPRO) 5 MG tablet Take 5 mg by mouth as directed.    . fluticasone (FLONASE) 50 MCG/ACT nasal spray Place 2 sprays into both nostrils as needed for allergies or rhinitis.   6  . irbesartan (AVAPRO) 150 MG tablet Take 150 mg by mouth daily.    Marland Kitchen lidocaine (LIDODERM) 5 % Place 1 patch onto the skin daily. Remove & Discard patch within 12 hours or as directed by MD    . loratadine (CLARITIN) 10 MG tablet Take 10 mg by mouth daily as needed for allergies.    . metoprolol tartrate (LOPRESSOR) 25 MG tablet Take 25 mg by mouth 2 (two) times daily.    .  mirtazapine (REMERON) 15 MG tablet Take 7.5 mg by mouth at bedtime.    . Multiple Vitamin (MULTIVITAMIN WITH MINERALS) TABS tablet Take 1 tablet by mouth daily.    . naproxen (NAPROSYN) 500 MG tablet Take 500 mg by mouth 2 (two) times daily as needed for pain.  0  . OXYGEN Inhale 3 L into the lungs daily.    . polyethylene glycol (MIRALAX / GLYCOLAX) packet Take 17 g by mouth daily.    . pravastatin (PRAVACHOL) 20 MG tablet Take 20 mg by mouth every morning.     . traMADol (ULTRAM) 50 MG tablet Take 50 mg by mouth 2 (two) times daily as needed for moderate pain.      No current facility-administered medications for this visit.     Allergies:   Sulfa antibiotics and Morphine and related   Social History:  The patient  reports that she quit smoking about 6 years ago. Her smoking use included cigarettes. She has a 25.00 pack-year smoking history. she has never used smokeless tobacco. She reports that she does not drink alcohol or use drugs.   Family History:  The patient's family history includes Breast cancer in her sister; Diabetes in her maternal aunt, mother, and sister.   ROS:  Please see the history of present illness.  Otherwise, review of systems is positive for shortness of breath.  All other systems are reviewed and negative.   PHYSICAL EXAM: VS:  BP 128/70   Pulse 85   Ht 5\' 5"  (1.651 m)   Wt 118 lb (53.5 kg)   BMI 19.64 kg/m  , BMI Body mass index is 19.64 kg/m. GEN: Elderly woman in NAD HEENT: normal  Neck: no JVD, no masses. No carotid bruits Cardiac: RRR without murmur or gallop      Respiratory:  clear to auscultation bilaterally, normal work of breathing GI: soft, nontender, nondistended, + BS MS: no deformity or atrophy  Ext: no pretibial edema, pedal pulses 2+= bilaterally Skin: warm and dry, no rash Neuro:  Strength and sensation are intact Psych: euthymic mood, full affect  EKG:  EKG is ordered today. The ekg ordered today shows sinus rhythm with occasional  PAC, otherwise within normal limits.  Recent Labs: No results found for requested labs within last 8760 hours.   Lipid Panel  No results found for: CHOL, TRIG, HDL, CHOLHDL, VLDL, LDLCALC, LDLDIRECT    Wt Readings from Last 3 Encounters:  10/07/17 118 lb (53.5 kg)  04/15/17 124 lb (56.2 kg)  12/17/16 117 lb (53.1 kg)    Cardiac Studies Reviewed: Echocardiogram 09/27/2014: Study Conclusions  - Left ventricle: Small underfilled hyperdynamic LV The cavity size was normal. Wall thickness was  increased in a pattern of mild LVH. Systolic function was vigorous. The estimated ejection fraction was in the range of 65% to 70%. Wall motion was normal; there were no regional wall motion abnormalities.  ASSESSMENT AND PLAN: 1.  Multifocal atrial tachycardia: Treated with metoprolol succinate and diltiazem.  Well controlled.  I will see her back in 1 year.  2.  Hypertension: Blood pressure is well controlled on current therapy.  No changes are made today.  Most recent labs are reviewed.  Renal function is essentially within normal limits with a creatinine of 1 mg/dL.  Current medicines are reviewed with the patient today.  The patient does not have concerns regarding medicines.  Labs/ tests ordered today include:   Orders Placed This Encounter  Procedures  . EKG 12-Lead    Disposition:   FU one year  Signed, Sherren Mocha, MD  10/07/2017 5:15 PM    [facility] [address], [place], [place]  [address] Phone: [phone]; Fax: [phone]

## 2017-10-13 ENCOUNTER — Telehealth: Payer: Self-pay | Admitting: Pulmonary Disease

## 2017-10-13 MED ORDER — IPRATROPIUM-ALBUTEROL 0.5-2.5 (3) MG/3ML IN SOLN: RESPIRATORY_TRACT | 2 refills | Status: DC

## 2017-10-13 NOTE — Telephone Encounter (Signed)
lmtcb x1 for Maura with Hospice.

## 2017-10-13 NOTE — Telephone Encounter (Signed)
Spoke with Cristela Blue, hospice nurse caring for pt who stated the pharmacy has not received a script of the Duoneb.  I checked pt's current med list and I did not see it on there.  Looked at pt's last OV with BQ and saw where he did state to order Duoneb for pt to take 3-4 times daily.  Sent script with pt's dx code to preferred pharmacy.  Cristela Blue stated to me pt has not been able to use [place] which was given to her as a sample. Cristela Blue wanted [place] to let BQ know pt will not be using the [place] and wanted to make sure it was okay for [place] to remove this from pt's med list.  Dr. Lake Bells, are you fine with the Caprice Renshaw being removed?

## 2017-10-17 NOTE — Telephone Encounter (Signed)
Please tell them I am concerned they are trying to manipulate the patient's medication for financial gain.  She has been taking [place] for years.  I'd like to know why this has changed suddenly.

## 2017-10-17 NOTE — Telephone Encounter (Signed)
Called Rachel Walters unable to reach left message to give [place] a call back.

## 2017-10-17 NOTE — Telephone Encounter (Signed)
BQ, please advise. Thanks!  

## 2017-10-18 NOTE — Telephone Encounter (Signed)
Called Maura and left a message to call back to discuss.

## 2017-10-18 NOTE — Telephone Encounter (Signed)
Spoke with Cristela Blue, she states the patient is requesting to stop the [place] because she rather do the duoneb because it is easier. Cristela Blue states the patient told her this and would like for it to be taken off of her med list because the patient stated she is not going to use it. She said the Duoneb is working effectively and the Caprice R[place] is not a covered medication. BQ please advise.

## 2017-10-19 NOTE — Telephone Encounter (Signed)
lmtcb x1 for Maura 

## 2017-10-19 NOTE — Telephone Encounter (Signed)
OK to switch to duoneb qid

## 2017-10-21 NOTE — Telephone Encounter (Signed)
Spoke with Judeen Hammans at [facility]. Was given Maura's cell number since she is not in the office. [phone].   Left message for Cristela Blue on her cell phone to call back.

## 2017-10-21 NOTE — Telephone Encounter (Signed)
Called and spoke with Cristela Blue, she is aware and verbalized understanding. Nothing further needed.

## 2017-10-21 NOTE — Telephone Encounter (Signed)
Rachel Walters is calling back [phone]

## 2017-11-30 ENCOUNTER — Ambulatory Visit: Payer: Medicare Other | Admitting: Pulmonary Disease

## 2018-01-03 ENCOUNTER — Ambulatory Visit (INDEPENDENT_AMBULATORY_CARE_PROVIDER_SITE_OTHER): Admitting: Pulmonary Disease

## 2018-01-03 ENCOUNTER — Encounter: Payer: Self-pay | Admitting: Pulmonary Disease

## 2018-01-03 VITALS — BP 142/80 | HR 74 | Ht 65.0 in | Wt 119.0 lb

## 2018-01-03 DIAGNOSIS — J432 Centrilobular emphysema: Secondary | ICD-10-CM

## 2018-01-03 DIAGNOSIS — J9611 Chronic respiratory failure with hypoxia: Secondary | ICD-10-CM

## 2018-01-03 NOTE — Patient Instructions (Signed)
Severe COPD: Continue using DuoNeb 2 times a day routinely, up to 3-4 times a day as needed for shortness of breath  Chronic respiratory failure with hypoxemia: Continue 3 L of oxygen continuously  Continue management with hospice.  We will see you back at the end of August

## 2018-01-03 NOTE — Progress Notes (Signed)
Subjective:    Patient ID: Rachel Walters, female    DOB: 05/28/1926, 84 y.o.   MRN: [id number]  Synopsis: GOLD grade D COPD first seen by pulmonary in 2015 Started on 2 L oxygen with exertion and each bedtime in 2015  HPI Chief Complaint  Patient presents with  . Follow-up    ROV   Rachel Walters has been really benefiting from the involvement of hospice.  She says that they have been very helpful with assisting her with activities of daily living and she feels much less short of breath when she has help with things like showering.  She continues to use her oxygen daily and benefits from it.  She continues to use her albuterol twice a day and she says this helps her clear mucus out of her lungs.  She has not had bronchitis or pneumonia since the last visit.  She still continues to struggle from severe dyspnea when she does any exertion.  She says "I have to gulp for air with just walking".     Past Medical History:  Diagnosis Date  . Arthritis   . Asthmatic bronchitis   . Cancer (Rachel Walters) 04/2015   lung cancer   . Cervical dystonia   . Complete uterine prolapse   . COPD (chronic obstructive pulmonary disease) ([place])   . Dyspnea 04/23/2014  . Esophageal dysmotility   . Esophageal reflux 05/19/2012  . H. pylori infection   . Headache(784.0)    sinus  . Hiatal hernia   . History of home oxygen therapy    uses 3 liters oxygen all the time  . Hypertension   . ILD (interstitial lung disease) ([place]) 05/24/2014  . Leg DVT (deep venous thromboembolism), acute ([place]) 09/03/2014   left leg  . Neuromuscular disorder ([place])   . PONV (postoperative nausea and vomiting)    with neck surgery  . Presbyesophagus      Review of Systems  Constitutional: Positive for fatigue. Negative for chills and fever.  HENT: Positive for rhinorrhea and sinus pressure. Negative for postnasal drip.   Respiratory: Positive for cough and shortness of breath. Negative for wheezing.   Cardiovascular: Negative for chest  pain, palpitations and leg swelling.       Objective:   Physical Exam  Vitals:   01/03/18 1454  BP: (!) 142/80  Pulse: 74  SpO2: 91%  Weight: 119 lb (54 kg)  Height: 5\' 5"  (1.651 m)  3L [place]  Gen: chronically ill appearing HENT: OP clear, TM's clear, neck supple PULM: Crackles bases, normal percussion CV: RRR, no mgr, trace edema GI: BS+, soft, nontender Derm: no cyanosis or rash Psyche: normal mood and affect   Chest imaging: March 2019 chest x-ray from the [facility] emergency department reviewed showing emphysema, stable nodule in the right lung  February 2018 cardiology records reviewed were she was cared for atrial fibrillation with diltiazem and metoprolol  BMET    Component Value Date/Time   NA 138 12/17/2014 1433   K 4.1 12/17/2014 1433   CL 95 (L) 12/17/2014 1433   CO2 26 09/06/2014 0519   GLUCOSE 91 12/17/2014 1433   BUN 20 12/17/2014 1433   CREATININE 1.00 12/17/2014 1433   CALCIUM 8.4 09/06/2014 0519   GFRNONAA 59 (L) 09/06/2014 0519   GFRAA 69 (L) 09/06/2014 0519   Echo: 04/2014 Echo> normal LVEF, grade 1 DD, normal RV and RVSP  PFT: 04/23/2014 Spirometry> 56% FEV1 0.84L (45% pred)     Assessment & Plan:  Chronic hypoxemic respiratory failure (HCC)  Centrilobular emphysema ([place])  Discussion: Reagyn is still benefiting from hospice though I agree with her and that she may likely outlive the 6 months allotted for that service.  I think a good transition would be to the transitional hospice program available to patients with chronic disease in our area.  Plan: Severe COPD: Continue using DuoNeb 2 times a day routinely, up to 3-4 times a day as needed for shortness of breath  Chronic respiratory failure with hypoxemia: Continue 3 L of oxygen continuously  Continue management with hospice.  We will see you back at the end of August   Current Outpatient Medications:  .  Aclidinium Bromide (TUDORZA PRESSAIR) 400 MCG/ACT AEPB, Inhale 1 puff into  the lungs 2 (two) times daily., Disp: 3 each, Rfl: 0 .  acyclovir (ZOVIRAX) 800 MG tablet, Take 800 mg by mouth at bedtime., Disp: , Rfl:  .  albuterol (PROVENTIL HFA;VENTOLIN HFA) 108 (90 Base) MCG/ACT inhaler, Inhale 2 puffs into the lungs every 6 (six) hours as needed for wheezing or shortness of breath., Disp: 1 Inhaler, Rfl: 5 .  Calcium Carbonate-Vitamin D (CALCIUM 600+D) 600-200 MG-UNIT TABS, Take 1 tablet by mouth every morning. , Disp: , Rfl:  .  clorazepate (TRANXENE) 7.5 MG tablet, Take 7.5 mg by mouth at bedtime as needed for anxiety., Disp: , Rfl:  .  DILT-XR 120 MG 24 hr capsule, Take 120 mg by mouth at bedtime., Disp: , Rfl:  .  diltiazem (DILT-XR) 240 MG 24 hr capsule, Take 1 capsule (240 mg total) by mouth daily. Please keep upcoming appt for future refills. Thank you, Disp: 30 capsule, Rfl: 0 .  escitalopram (LEXAPRO) 5 MG tablet, Take 5 mg by mouth as directed., Disp: , Rfl:  .  fluticasone (FLONASE) 50 MCG/ACT nasal spray, Place 2 sprays into both nostrils as needed for allergies or rhinitis. , Disp: , Rfl: 6 .  ipratropium-albuterol (DUONEB) 0.5-2.5 (3) MG/3ML SOLN, Use three to four times daily., Disp: 360 mL, Rfl: 2 .  irbesartan (AVAPRO) 150 MG tablet, Take 150 mg by mouth daily., Disp: , Rfl:  .  lidocaine (LIDODERM) 5 %, Place 1 patch onto the skin daily. Remove & Discard patch within 12 hours or as directed by MD, Disp: , Rfl:  .  loratadine (CLARITIN) 10 MG tablet, Take 10 mg by mouth daily as needed for allergies., Disp: , Rfl:  .  metoprolol tartrate (LOPRESSOR) 25 MG tablet, Take 25 mg by mouth 2 (two) times daily., Disp: , Rfl:  .  mirtazapine (REMERON) 15 MG tablet, Take 7.5 mg by mouth at bedtime., Disp: , Rfl:  .  Multiple Vitamin (MULTIVITAMIN WITH MINERALS) TABS tablet, Take 1 tablet by mouth daily., Disp: , Rfl:  .  naproxen (NAPROSYN) 500 MG tablet, Take 500 mg by mouth 2 (two) times daily as needed for pain., Disp: , Rfl: 0 .  OXYGEN, Inhale 3 L into the lungs  daily., Disp: , Rfl:  .  polyethylene glycol (MIRALAX / GLYCOLAX) packet, Take 17 g by mouth daily., Disp: , Rfl:  .  pravastatin (PRAVACHOL) 20 MG tablet, Take 20 mg by mouth every morning. , Disp: , Rfl:  .  traMADol (ULTRAM) 50 MG tablet, Take 50 mg by mouth 2 (two) times daily as needed for moderate pain. , Disp: , Rfl:

## 2018-01-04 ENCOUNTER — Telehealth: Payer: Self-pay | Admitting: Pulmonary Disease

## 2018-01-04 NOTE — Telephone Encounter (Signed)
Called number provided and spoke with Cristela Blue, patients hospice nurse. She is requesting patients OV note from yesterdays office visit. Nothing further needed.

## 2018-01-12 ENCOUNTER — Telehealth: Payer: Self-pay

## 2018-01-12 NOTE — Telephone Encounter (Signed)
VM left for patient to schedule a visit for Palliative Care.

## 2018-01-19 ENCOUNTER — Other Ambulatory Visit: Payer: Medicare Other | Admitting: Internal Medicine

## 2018-01-20 ENCOUNTER — Telehealth: Payer: Self-pay

## 2018-01-20 NOTE — Telephone Encounter (Signed)
Received a phone call from [facility] at Dr. Danna Hefty office regarding Palliative Care consult. NP made visit on 01/19/18. Will fax Spo2 to PCP office

## 2018-01-23 ENCOUNTER — Other Ambulatory Visit: Payer: Medicare Other

## 2018-01-23 DIAGNOSIS — Z515 Encounter for palliative care: Secondary | ICD-10-CM | POA: Insufficient documentation

## 2018-01-23 NOTE — Progress Notes (Signed)
Visit made at the request of Palliative Care NP to check oxygen level on room air.SPo2 at rest on room air - 88%. SPO2 with ambulation- 87%. Oxygen level at 3 liters at rest 97%. Respirations labored and patient reported feeling shortness of breath after ambulation. Patient continues to report some discomfort to back but is aware that MD called in lidocaine patch. Informed patient that PCP ordered PT and Home health nursing. Dr. Dagmar Hait office made aware of oxygen levels

## 2018-02-13 ENCOUNTER — Telehealth: Payer: Self-pay

## 2018-02-13 NOTE — Telephone Encounter (Signed)
VM left for patient to reschedule visit to 02/14/18

## 2018-02-16 ENCOUNTER — Other Ambulatory Visit: Payer: Medicare Other | Admitting: Internal Medicine

## 2018-03-08 ENCOUNTER — Telehealth: Payer: Self-pay | Admitting: Pulmonary Disease

## 2018-03-08 NOTE — Telephone Encounter (Signed)
Called and spoke with pt who stated she had a cxr done at pcp which she said showed pna and also growth of a lung nodule. Pt is requesting an order for a ct scan to be done.  Pt is wanting this done due to having two different answers. Due to BQ being out of the office, sending to DOD.  Dr. Elsworth Soho, please see pt's cxr and advise if it is okay for [place] to order a ct to further evaluate cxr. Thanks!

## 2018-03-10 NOTE — Telephone Encounter (Signed)
Spoke with pt. She has a pending OV with BQ on 03/24/18. Pt will discuss this with him then. Nothing further was needed.

## 2018-03-10 NOTE — Telephone Encounter (Signed)
She needs to have an ROV first to review her clinical status.  Would also need to review CXR >> not in Epic.  Please schedule ROV with Dr. Lake Bells or NP for next week.

## 2018-03-10 NOTE — Telephone Encounter (Signed)
Routing message to DOD today which is VS since BQ is out of the office until 03/14/18 Called and spoke with pt who stated she had a cxr done at pcp which she said showed pna and also growth of a lung nodule.  Pt is requesting an order for a ct scan to be done.  VS please advise

## 2018-03-16 ENCOUNTER — Other Ambulatory Visit: Payer: Medicare Other | Admitting: Internal Medicine

## 2018-03-16 DIAGNOSIS — Z515 Encounter for palliative care: Secondary | ICD-10-CM

## 2018-03-16 NOTE — Progress Notes (Signed)
    PALLIATIVE CARE CONSULT VISIT   PATIENT NAME: Rachel Walters DOB: 1925/10/22 MRN: [id number]  PRIMARY CARE PROVIDER:   Prince Solian, MD  REFERRING PROVIDER:  Prince Solian, [address] [place], [place] [address]   NOTE:     Arrived at patient's home at 1:00pm for a routine follow-up.  No response at door and voicemail message received when listed number was called.  No hospitalization noted per review of Epic.  Message given to Maxwell Caul, RN to contact patient for rescheduling of Palliative Care visit.  Gonzella Lex, NP-C

## 2018-03-17 ENCOUNTER — Telehealth: Payer: Self-pay

## 2018-03-17 NOTE — Telephone Encounter (Signed)
Phone call placed to patient to reschedule visit with Palliative Care. Scheduled for 03/23/18

## 2018-03-23 ENCOUNTER — Other Ambulatory Visit: Payer: Medicare Other | Admitting: Internal Medicine

## 2018-03-23 DIAGNOSIS — R0602 Shortness of breath: Secondary | ICD-10-CM

## 2018-03-23 DIAGNOSIS — R5383 Other fatigue: Secondary | ICD-10-CM

## 2018-03-23 DIAGNOSIS — Z515 Encounter for palliative care: Secondary | ICD-10-CM

## 2018-03-24 ENCOUNTER — Ambulatory Visit (INDEPENDENT_AMBULATORY_CARE_PROVIDER_SITE_OTHER): Payer: Medicare Other | Admitting: Pulmonary Disease

## 2018-03-24 ENCOUNTER — Encounter: Payer: Self-pay | Admitting: Pulmonary Disease

## 2018-03-24 VITALS — BP 130/90 | HR 100 | Wt 115.6 lb

## 2018-03-24 DIAGNOSIS — R918 Other nonspecific abnormal finding of lung field: Secondary | ICD-10-CM

## 2018-03-24 DIAGNOSIS — J9611 Chronic respiratory failure with hypoxia: Secondary | ICD-10-CM

## 2018-03-24 DIAGNOSIS — J181 Lobar pneumonia, unspecified organism: Secondary | ICD-10-CM

## 2018-03-24 DIAGNOSIS — J189 Pneumonia, unspecified organism: Secondary | ICD-10-CM

## 2018-03-24 DIAGNOSIS — J432 Centrilobular emphysema: Secondary | ICD-10-CM | POA: Diagnosis not present

## 2018-03-24 DIAGNOSIS — R911 Solitary pulmonary nodule: Secondary | ICD-10-CM | POA: Diagnosis not present

## 2018-03-24 MED ORDER — DOXYCYCLINE HYCLATE 100 MG PO TABS: 100.0000 mg | ORAL_TABLET | Freq: Two times a day (BID) | ORAL | 0 refills | Status: DC

## 2018-03-24 MED ORDER — IPRATROPIUM BROMIDE 0.03 % NA SOLN: 2.0000 | Freq: Two times a day (BID) | NASAL | 12 refills | Status: AC

## 2018-03-24 NOTE — Patient Instructions (Signed)
Right lung mass: We will check a CT scan  Pneumonia: Take another round of antibiotics: Doxycycline 100 mg twice a day x7 days Use Mucinex twice a day to help clear the mucus out  Severe COPD: Continue DuoNeb 4 times a day  Chronic respiratory failure with hypoxemia: 3 L of oxygen continuously  We will see you back in 6 to 8 weeks or sooner if needed

## 2018-03-24 NOTE — Progress Notes (Signed)
Subjective:    Patient ID: Rachel Walters, female    DOB: January 26, 1926, 84 y.o.   MRN: [id number]  Synopsis: GOLD grade D COPD first seen by pulmonary in 2015 Started on 2 L oxygen with exertion and each bedtime in 2015 She has a right lung mass which she has chosen to forego biopsy. This has been present since at least 2017.  HPI Chief Complaint  Patient presents with  . Follow-up    feels terrible, wants to sleep all the time, question of nodules grown or pneumonia, on 3 L continuous   Rachel Walters feels like she is "headed down".  She feels tired all the time and wants to sleep all the time.  She says that she had a CXR recently because she was feeling so run down> noted to have a right lung mass.    She says that she is still coughing up mucus at night, typically gray to yellow in color.  She feels chills but no fever.  She feels a bit more short of breath.  She still using the DuoNeb routinely.  She continues to use and benefit from her oxygen.     Past Medical History:  Diagnosis Date  . Arthritis   . Asthmatic bronchitis   . Cancer ([place]) 04/2015   lung cancer   . Cervical dystonia   . Complete uterine prolapse   . COPD (chronic obstructive pulmonary disease) ([place])   . Dyspnea 04/23/2014  . Esophageal dysmotility   . Esophageal reflux 05/19/2012  . H. pylori infection   . Headache(784.0)    sinus  . Hiatal hernia   . History of home oxygen therapy    uses 3 liters oxygen all the time  . Hypertension   . ILD (interstitial lung disease) ([place]) 05/24/2014  . Leg DVT (deep venous thromboembolism), acute ([place]) 09/03/2014   left leg  . Neuromuscular disorder ([place])   . PONV (postoperative nausea and vomiting)    with neck surgery  . Presbyesophagus      Review of Systems  Constitutional: Positive for fatigue. Negative for chills and fever.  HENT: Positive for rhinorrhea and sinus pressure. Negative for postnasal drip.   Respiratory: Positive for cough and shortness of breath.  Negative for wheezing.   Cardiovascular: Negative for chest pain, palpitations and leg swelling.       Objective:   Physical Exam  Vitals:   03/24/18 1541  BP: 130/90  Pulse: 100  SpO2: 94%  Weight: 115 lb 9.6 oz (52.4 kg)  3L [place]  Gen: well appearing HENT: OP clear, TM's clear, neck supple PULM: Coarse crackles R lung from base to 2/3 way up B, normal percussion CV: RRR, no mgr, trace edema GI: BS+, soft, nontender Derm: no cyanosis or rash Psyche: normal mood and affect   Chest imaging: March 2019 chest x-ray from the [facility] emergency department reviewed showing emphysema, stable nodule in the right lung  February 2018 cardiology records reviewed were she was cared for atrial fibrillation with diltiazem and metoprolol  BMET    Component Value Date/Time   NA 138 12/17/2014 1433   K 4.1 12/17/2014 1433   CL 95 (L) 12/17/2014 1433   CO2 26 09/06/2014 0519   GLUCOSE 91 12/17/2014 1433   BUN 20 12/17/2014 1433   CREATININE 1.00 12/17/2014 1433   CALCIUM 8.4 09/06/2014 0519   GFRNONAA 59 (L) 09/06/2014 0519   GFRAA 69 (L) 09/06/2014 0519   Echo: 04/2014 Echo> normal LVEF, grade 1  DD, normal RV and RVSP  PFT: 04/23/2014 Spirometry> 56% FEV1 0.84L (45% pred)     Assessment & Plan:   Right lower lobe lung mass  Solitary pulmonary nodule - Plan: CT Chest Wo Contrast  Centrilobular emphysema (HCC)  Chronic hypoxemic respiratory failure ([place])  Community acquired pneumonia of right lower lobe of lung ([place])  Discussion: I am concerned about Rachel Walters.  I think she has persistent pneumonia and considering her known right lung mass which she has chosen to do nothing about makes me concerned that she may have postobstructive pneumonia from the mass.  I am going to give her another course of antibiotics today and then we will pursue a CT scan.  I actually tried to talk her out of doing a CT scan because she has been reluctant to do anything about the mass and I think just a  trip to the [place] will wear her out.  However, she says that she would like to have it done to try to settle her mind.  Plan: Right lung mass: We will check a CT scan  Pneumonia: Take another round of antibiotics: Doxycycline 100 mg twice a day x7 days Use Mucinex twice a day to help clear the mucus out  Severe COPD: Continue DuoNeb 4 times a day  Chronic respiratory failure with hypoxemia: 3 L of oxygen continuously  We will see you back in 6 to 8 weeks or sooner if needed   Current Outpatient Medications:  .  Aclidinium Bromide (TUDORZA PRESSAIR) 400 MCG/ACT AEPB, Inhale 1 puff into the lungs 2 (two) times daily., Disp: 3 each, Rfl: 0 .  acyclovir (ZOVIRAX) 800 MG tablet, Take 800 mg by mouth at bedtime., Disp: , Rfl:  .  albuterol (PROVENTIL HFA;VENTOLIN HFA) 108 (90 Base) MCG/ACT inhaler, Inhale 2 puffs into the lungs every 6 (six) hours as needed for wheezing or shortness of breath., Disp: 1 Inhaler, Rfl: 5 .  Calcium Carbonate-Vitamin D (CALCIUM 600+D) 600-200 MG-UNIT TABS, Take 1 tablet by mouth every morning. , Disp: , Rfl:  .  clorazepate (TRANXENE) 7.5 MG tablet, Take 7.5 mg by mouth at bedtime as needed for anxiety., Disp: , Rfl:  .  DILT-XR 120 MG 24 hr capsule, Take 120 mg by mouth at bedtime., Disp: , Rfl:  .  diltiazem (DILT-XR) 240 MG 24 hr capsule, Take 1 capsule (240 mg total) by mouth daily. Please keep upcoming appt for future refills. Thank you, Disp: 30 capsule, Rfl: 0 .  escitalopram (LEXAPRO) 5 MG tablet, Take 5 mg by mouth as directed., Disp: , Rfl:  .  fluticasone (FLONASE) 50 MCG/ACT nasal spray, Place 2 sprays into both nostrils as needed for allergies or rhinitis. , Disp: , Rfl: 6 .  gabapentin (NEURONTIN) 300 MG capsule, , Disp: , Rfl:  .  ipratropium-albuterol (DUONEB) 0.5-2.5 (3) MG/3ML SOLN, Use three to four times daily., Disp: 360 mL, Rfl: 2 .  irbesartan (AVAPRO) 150 MG tablet, Take 150 mg by mouth daily., Disp: , Rfl:  .  lidocaine (LIDODERM)  5 %, Place 1 patch onto the skin daily. Remove & Discard patch within 12 hours or as directed by MD, Disp: , Rfl:  .  loratadine (CLARITIN) 10 MG tablet, Take 10 mg by mouth daily as needed for allergies., Disp: , Rfl:  .  metoprolol tartrate (LOPRESSOR) 25 MG tablet, Take 25 mg by mouth 2 (two) times daily., Disp: , Rfl:  .  mirtazapine (REMERON) 15 MG tablet, Take 7.5 mg by  mouth at bedtime., Disp: , Rfl:  .  Multiple Vitamin (MULTIVITAMIN WITH MINERALS) TABS tablet, Take 1 tablet by mouth daily., Disp: , Rfl:  .  naproxen (NAPROSYN) 500 MG tablet, Take 500 mg by mouth 2 (two) times daily as needed for pain., Disp: , Rfl: 0 .  OXYGEN, Inhale 3 L into the lungs daily., Disp: , Rfl:  .  polyethylene glycol (MIRALAX / GLYCOLAX) packet, Take 17 g by mouth daily., Disp: , Rfl:  .  pravastatin (PRAVACHOL) 20 MG tablet, Take 20 mg by mouth every morning. , Disp: , Rfl:  .  traMADol (ULTRAM) 50 MG tablet, Take 50 mg by mouth 2 (two) times daily as needed for moderate pain. , Disp: , Rfl:

## 2018-04-03 ENCOUNTER — Other Ambulatory Visit: Payer: Medicare Other

## 2018-04-04 ENCOUNTER — Ambulatory Visit (INDEPENDENT_AMBULATORY_CARE_PROVIDER_SITE_OTHER): Payer: Medicare Other

## 2018-04-04 DIAGNOSIS — R911 Solitary pulmonary nodule: Secondary | ICD-10-CM

## 2018-04-04 DIAGNOSIS — I700 Atherosclerosis of aorta: Secondary | ICD-10-CM | POA: Diagnosis not present

## 2018-04-04 DIAGNOSIS — J439 Emphysema, unspecified: Secondary | ICD-10-CM

## 2018-04-04 DIAGNOSIS — R918 Other nonspecific abnormal finding of lung field: Secondary | ICD-10-CM

## 2018-04-06 ENCOUNTER — Telehealth: Payer: Self-pay | Admitting: Pulmonary Disease

## 2018-04-06 NOTE — Telephone Encounter (Signed)
LMTCB. Also left message with son Tamiko Leopard to give office a call regarding a good time and good contact number for MD McQuaid to discuss CT results with patient.

## 2018-04-06 NOTE — Telephone Encounter (Signed)
Attempted to call patient today regarding results. I did not receive an answer at time of call. I have left a voicemail message for pt to return call. X2  

## 2018-04-06 NOTE — Telephone Encounter (Signed)
I tried calling Rachel Walters to discuss the results of the CT scan.  I had to leave a message. Will CC triage to try to reach out to her again and let me know when she can be reached

## 2018-04-06 NOTE — Telephone Encounter (Signed)
There is another message already open with same call info Please review the phone message named results from BQ Closing this message today

## 2018-04-10 ENCOUNTER — Telehealth: Payer: Self-pay | Admitting: Pulmonary Disease

## 2018-04-10 NOTE — Telephone Encounter (Signed)
Return patient's son Chuck's call. He reported that patient has been having trouble with her land line and was unable to talk to Dr. Lake Bells when he attempted to call.  He left patient's cell phone number: [phone] to reach patient. If she for some reason isn't available he said to call him at [phone].  Let him know that the call back might not be until 04/12/18.  Dr. Lake Bells please call patient at her cell phone number (about CT results.) Thank you!

## 2018-04-12 NOTE — Telephone Encounter (Signed)
I called her son to let him know that the CT showed progression of her lung masses.  I explained that this is the reason why she feels worse and it is consistent with our suspicion of lung cancer.

## 2018-04-20 ENCOUNTER — Other Ambulatory Visit: Payer: Medicare Other | Admitting: Internal Medicine

## 2018-04-20 DIAGNOSIS — Z515 Encounter for palliative care: Secondary | ICD-10-CM

## 2018-04-20 NOTE — Progress Notes (Signed)
PALLIATIVE CARE CONSULT VISIT   PATIENT NAME: Rachel Walters DOB: 09/10/1925 MRN: [id number]  PRIMARY CARE PROVIDER:   Prince Solian, MD  REFERRING PROVIDER:  Prince Solian, MD [place], [place] [address]  RESPONSIBLE PARTY:   Self      RECOMMENDATIONS and PLAN:  1.  Fatigue R53.83:  Associated with weight loss of uncertain amount and no appetite.  Increase Boost from qday to 3-4 per day.  Pending FU appt with Pulmonologist on 8/30. Monitor weight weekly  2.  Shortness of breath R06.02:  Chronic with continued use of supplemental O2. COPD  Completed antibiotics for early PNA.  Consider additional pulmonary imaging due to history of questionable increased size of lung mass per pt report.   3.  Palliative care encounter Z51.5:  Pt is showing some signs of decline with anxiety related to unknown cause of new symptoms.  Her plan is still to remain in her home, DNR and avoid hospitalizations.  She is anxious to see Pulmonologist for additional insight.  Palliative care will continue to follow.   I spent 30 minutes providing this consultation,  from 12:30pm to 1:00pm at home. More than 50% of the time in this consultation was spent coordinating communication with patient.   HISTORY OF PRESENT ILLNESS: Followup with Luanna Salk who reports that she has been having no appetite and increased frequency of sleeping with increased energy..  She completed antibiotic therapy for an "early pneumonia" and there is a question of increased size of pulmonary mass.  Per CXR.  No biopsy performed.   CODE STATUS: DNR  PPS: 40%  weak HOSPICE ELIGIBILITY/DIAGNOSIS: TBD  PAST MEDICAL HISTORY:  Past Medical History:  Diagnosis Date  . Arthritis   . Asthmatic bronchitis   . Cancer ([place]) 04/2015   lung cancer   . Cervical dystonia   . Complete uterine prolapse   . COPD (chronic obstructive pulmonary disease) ([place])   . Dyspnea 04/23/2014  . Esophageal dysmotility   .  Esophageal reflux 05/19/2012  . H. pylori infection   . Headache(784.0)    sinus  . Hiatal hernia   . History of home oxygen therapy    uses 3 liters oxygen all the time  . Hypertension   . ILD (interstitial lung disease) ([place]) 05/24/2014  . Leg DVT (deep venous thromboembolism), acute ([place]) 09/03/2014   left leg  . Neuromuscular disorder ([place])   . PONV (postoperative nausea and vomiting)    with neck surgery  . Presbyesophagus     SOCIAL HX:  Social History   Tobacco Use  . Smoking status: Former Smoker    Packs/day: 0.50    Years: 50.00    Pack years: 25.00    Types: Cigarettes    Last attempt to quit: 07/05/2011    Years since quitting: 6.7  . Smokeless tobacco: Never Used  Substance Use Topics  . Alcohol use: No    Alcohol/week: 0.0 standard drinks    ALLERGIES:  Allergies  Allergen Reactions  . Sulfa Antibiotics Other (See Comments)    Skin turned yellow  . Morphine And Related Nausea And Vomiting     PERTINENT MEDICATIONS:  Outpatient Encounter Medications as of 03/23/2018  Medication Sig  . Aclidinium Bromide (TUDORZA PRESSAIR) 400 MCG/ACT AEPB Inhale 1 puff into the lungs 2 (two) times daily.  Marland Kitchen acyclovir (ZOVIRAX) 800 MG tablet Take 800 mg by mouth at bedtime.  Marland Kitchen albuterol (PROVENTIL HFA;VENTOLIN HFA) 108 (90 Base) MCG/ACT inhaler Inhale  2 puffs into the lungs every 6 (six) hours as needed for wheezing or shortness of breath.  . Calcium Carbonate-Vitamin D (CALCIUM 600+D) 600-200 MG-UNIT TABS Take 1 tablet by mouth every morning.   . clorazepate (TRANXENE) 7.5 MG tablet Take 7.5 mg by mouth at bedtime as needed for anxiety.  Marland Kitchen DILT-XR 120 MG 24 hr capsule Take 120 mg by mouth at bedtime.  Marland Kitchen diltiazem (DILT-XR) 240 MG 24 hr capsule Take 1 capsule (240 mg total) by mouth daily. Please keep upcoming appt for future refills. Thank you  . escitalopram (LEXAPRO) 5 MG tablet Take 5 mg by mouth as directed.  . fluticasone (FLONASE) 50 MCG/ACT nasal spray Place 2  sprays into both nostrils as needed for allergies or rhinitis.   Marland Kitchen ipratropium-albuterol (DUONEB) 0.5-2.5 (3) MG/3ML SOLN Use three to four times daily.  . irbesartan (AVAPRO) 150 MG tablet Take 150 mg by mouth daily.  Marland Kitchen lidocaine (LIDODERM) 5 % Place 1 patch onto the skin daily. Remove & Discard patch within 12 hours or as directed by MD  . loratadine (CLARITIN) 10 MG tablet Take 10 mg by mouth daily as needed for allergies.  . metoprolol tartrate (LOPRESSOR) 25 MG tablet Take 25 mg by mouth 2 (two) times daily.  . mirtazapine (REMERON) 15 MG tablet Take 7.5 mg by mouth at bedtime.  . Multiple Vitamin (MULTIVITAMIN WITH MINERALS) TABS tablet Take 1 tablet by mouth daily.  . naproxen (NAPROSYN) 500 MG tablet Take 500 mg by mouth 2 (two) times daily as needed for pain.  . OXYGEN Inhale 3 L into the lungs daily.  . polyethylene glycol (MIRALAX / GLYCOLAX) packet Take 17 g by mouth daily.  . pravastatin (PRAVACHOL) 20 MG tablet Take 20 mg by mouth every morning.   . traMADol (ULTRAM) 50 MG tablet Take 50 mg by mouth 2 (two) times daily as needed for moderate pain.    No facility-administered encounter medications on file as of 03/23/2018.     PHYSICAL EXAM:   BP 98/50 P 75  R18  Sats 98% on O2  General: frail and fragile appearing, thin.  In NAD Cardiovascular: regular rate and rhythm Pulmonary: Decreased breath sounds in bilat lower lung fields.  Clear in upper fields.  O2 via [place] in use Abdomen: soft, nontender, + bowel sounds Extremities: no edema, R stiffness of neck Skin: Expose skin is intact Neurological: Alert and oriented x 3.  Weakness but otherwise nonfocal  Gonzella Lex, NP-C

## 2018-04-20 NOTE — Progress Notes (Signed)
    PALLIATIVE CARE CONSULT NOTE  PATIENT NAME: Rachel Walters DOB: [birth year] MRN: [id number]  PRIMARY CARE PROVIDER:   Prince Solian, MD  REFERRING PROVIDER:  Prince Solian, MD [place], [place] [address]  RESPONSIBLE PARTY:   self  ASSESSMENT:    Palliative Care Encounter:   Arrived at patient's home for routine Palliative Care visit.  Patient stated and it was confirmed that she has been readmitted to [place] at home with [facility] as of 04/19/18.  She states that it is suspected that she has lung cancer due to growth of the lung mass, weight loss and poor appetite.  No planned treatment or surgery for this disease process per pt. And review of medical record.  Patient will be discharged from Palliative Care.  Support given.   Gonzella Lex, NP

## 2018-04-24 ENCOUNTER — Telehealth: Payer: Self-pay | Admitting: Pulmonary Disease

## 2018-04-24 NOTE — Telephone Encounter (Signed)
Spoke with [facility] with Hospice and Palliative Care. She states that she received this OV note on Friday from our office. Nothing further was needed at this time.

## 2018-05-08 ENCOUNTER — Telehealth: Payer: Self-pay | Admitting: Pulmonary Disease

## 2018-05-08 NOTE — Telephone Encounter (Signed)
Called and spoke to patient, requesting a copy of her CT report be mailed to her. Report printed and placed up front in out going mail. Nothing further is needed at this time.

## 2018-05-17 ENCOUNTER — Telehealth: Payer: Self-pay | Admitting: Pulmonary Disease

## 2018-05-17 MED ORDER — ALBUTEROL SULFATE HFA 108 (90 BASE) MCG/ACT IN AERS: 2.0000 | INHALATION_SPRAY | Freq: Four times a day (QID) | RESPIRATORY_TRACT | 2 refills | Status: AC | PRN

## 2018-05-17 MED ORDER — IPRATROPIUM-ALBUTEROL 0.5-2.5 (3) MG/3ML IN SOLN: RESPIRATORY_TRACT | 2 refills | Status: AC

## 2018-05-17 MED ORDER — GUAIFENESIN ER 600 MG PO TB12: 600.0000 mg | ORAL_TABLET | Freq: Two times a day (BID) | ORAL | 3 refills | Status: AC

## 2018-05-17 NOTE — Telephone Encounter (Signed)
Yes hospice eligible

## 2018-05-17 NOTE — Telephone Encounter (Signed)
Spoke with Cristela Blue, she would like to know if she is still eligible because she has been doing ok and she feels she is not declining much. She states the pt would be better with support of family. She wanted to see what Dr. Lake Bells thought and advised to call her if he wanted more details. I sent the refills into the pharmacy.   Patient Instructions by Juanito Doom, MD at 03/24/2018 3:30 PM  Author: Juanito Doom, MD Author Type: Physician Filed: 03/24/2018 4:05 PM  Note Status: Signed Cosign: Cosign Not Required Encounter Date: 03/24/2018  Editor: Juanito Doom, MD (Physician)    Right lung mass: We will check a CT scan  Pneumonia: Take another round of antibiotics: Doxycycline 100 mg twice a day x7 days Use Mucinex twice a day to help clear the mucus out  Severe COPD: Continue DuoNeb 4 times a day  Chronic respiratory failure with hypoxemia: 3 L of oxygen continuously  We will see you back in 6 to 8 weeks or sooner if needed    Instructions      Return in about 6 weeks (around 05/05/2018).  Right lung mass: We will check a CT scan  Pneumonia:

## 2018-05-18 NOTE — Telephone Encounter (Signed)
Attempted to call Maura with Hospice and Palliative Care but unable to reach her.  Left message for Cristela Blue to return call x1

## 2018-05-19 NOTE — Telephone Encounter (Signed)
Called and spoke with Rachel Walters and stated to her that BQ said that pt is hospice eligible.  Per Rachel Walters, BQ will need to state that in his OV note when pt sees him 05/29/18  Routing to BQ as an [place]

## 2018-05-19 NOTE — Telephone Encounter (Signed)
OK 

## 2018-05-29 ENCOUNTER — Ambulatory Visit (INDEPENDENT_AMBULATORY_CARE_PROVIDER_SITE_OTHER)
Admission: RE | Admit: 2018-05-29 | Discharge: 2018-05-29 | Disposition: A | Discharge status: Home / Self Care | Source: Ambulatory Visit | Attending: Pulmonary Disease | Admitting: Pulmonary Disease

## 2018-05-29 ENCOUNTER — Encounter: Payer: Self-pay | Admitting: Pulmonary Disease

## 2018-05-29 ENCOUNTER — Ambulatory Visit (INDEPENDENT_AMBULATORY_CARE_PROVIDER_SITE_OTHER): Admitting: Pulmonary Disease

## 2018-05-29 ENCOUNTER — Telehealth: Payer: Self-pay | Admitting: Pulmonary Disease

## 2018-05-29 VITALS — BP 118/70 | HR 77 | Wt 112.0 lb

## 2018-05-29 DIAGNOSIS — J432 Centrilobular emphysema: Secondary | ICD-10-CM | POA: Diagnosis not present

## 2018-05-29 DIAGNOSIS — M546 Pain in thoracic spine: Secondary | ICD-10-CM

## 2018-05-29 DIAGNOSIS — R918 Other nonspecific abnormal finding of lung field: Secondary | ICD-10-CM

## 2018-05-29 DIAGNOSIS — J9611 Chronic respiratory failure with hypoxia: Secondary | ICD-10-CM | POA: Diagnosis not present

## 2018-05-29 DIAGNOSIS — R911 Solitary pulmonary nodule: Secondary | ICD-10-CM

## 2018-05-29 DIAGNOSIS — J3089 Other allergic rhinitis: Secondary | ICD-10-CM

## 2018-05-29 NOTE — Patient Instructions (Signed)
Right lung mass: As we discussed today this is increasing and causing the pain in your back For now continue tramadol as you are doing I would use ibuprofen 400 mg 2-3 times a day instead of Tylenol I think it is entirely appropriate for you to transition from tramadol to morphine however, this can be coordinated with your hospice team  Severe COPD: Continue DuoNeb 4 times a day  Severe shortness of breath: Consider starting using morphine under the direction of the hospice team  Chronic respiratory failure with hypoxemia: Keep using 3 L of oxygen continuously  Allergic rhinitis with postnasal drip and mucus in your throat: Take over-the-counter Zyrtec daily Take over-the-counter fluticasone 2 sprays each nostril daily Use saline rinses in your sinuses twice a day, I recommend NeilMed rinses  We will see you back in January 2020 or sooner if needed

## 2018-05-29 NOTE — Telephone Encounter (Signed)
Called and spoke to [place]. Let her know that I have faxed over patient's OV from today and the previous CT scan. Will fax over Xray report from today once it has been read by MD and available.

## 2018-05-29 NOTE — Progress Notes (Signed)
Subjective:    Patient ID: Rachel Walters, female    DOB: 1925/08/11, 84 y.o.   MRN: [id number]  Synopsis: GOLD grade D COPD first seen by pulmonary in 2015 Started on 2 L oxygen with exertion and each bedtime in 2015 She has a right lung mass which she has chosen to forego biopsy. This has been present since at least 2017.  HPI Chief Complaint  Patient presents with  . Follow-up    increased SOB, reports a lot of pain in back   Rachel Walters has lost a little bit of weight since the last visit and her shortness of breath has worsened significantly to the point where now she can only do minimal activity without severe shortness of breath.  Specifically she says that just standing up to change her close causes her to feel quite short of breath.  She has also been experiencing worsening pain in the upper part of her right lung.  She has not had a cough or mucus production.  She continues to take her medicines as prescribed.  She is using tramadol every 6 hours which gives some relief.  She is also using Tylenol.  She continues to struggle with postnasal drip and has mucus which builds up in the back of her throat frequently.  She uses Mucinex routinely.  Past Medical History:  Diagnosis Date  . Arthritis   . Asthmatic bronchitis   . Cancer ([place]) 04/2015   lung cancer   . Cervical dystonia   . Complete uterine prolapse   . COPD (chronic obstructive pulmonary disease) ([place])   . Dyspnea 04/23/2014  . Esophageal dysmotility   . Esophageal reflux 05/19/2012  . H. pylori infection   . Headache(784.0)    sinus  . Hiatal hernia   . History of home oxygen therapy    uses 3 liters oxygen all the time  . Hypertension   . ILD (interstitial lung disease) ([place]) 05/24/2014  . Leg DVT (deep venous thromboembolism), acute ([place]) 09/03/2014   left leg  . Neuromuscular disorder ([place])   . PONV (postoperative nausea and vomiting)    with neck surgery  . Presbyesophagus      Review of Systems    Constitutional: Positive for fatigue. Negative for chills and fever.  HENT: Positive for rhinorrhea and sinus pressure. Negative for postnasal drip.   Respiratory: Positive for cough and shortness of breath. Negative for wheezing.   Cardiovascular: Negative for chest pain, palpitations and leg swelling.       Objective:   Physical Exam  Vitals:   05/29/18 1634  BP: 118/70  Pulse: 77  SpO2: 92%  Weight: 112 lb (50.8 kg)  3L [place]  Gen: elderly, frail, chronically ill appearing HENT: OP clear, TM's clear, neck supple PULM: few crackles RUL B, normal percussion CV: RRR, no mgr, trace edema GI: BS+, soft, nontender Derm: no cyanosis or rash Psyche: normal mood and affect    Chest imaging: March 2019 chest x-ray from the [facility] emergency department reviewed showing emphysema, stable nodule in the right lung September 2019 CT chest images independently reviewed showing 2 separate masses in the right upper lobe and one in the right lower lobe both of which have increased in size.  February 2018 cardiology records reviewed were she was cared for atrial fibrillation with diltiazem and metoprolol  BMET    Component Value Date/Time   NA 138 12/17/2014 1433   K 4.1 12/17/2014 1433   CL 95 (L) 12/17/2014 1433  CO2 26 09/06/2014 0519   GLUCOSE 91 12/17/2014 1433   BUN 20 12/17/2014 1433   CREATININE 1.00 12/17/2014 1433   CALCIUM 8.4 09/06/2014 0519   GFRNONAA 59 (L) 09/06/2014 0519   GFRAA 69 (L) 09/06/2014 0519   Echo: 04/2014 Echo> normal LVEF, grade 1 DD, normal RV and RVSP  PFT: 04/23/2014 Spirometry> 56% FEV1 0.84L (45% pred)     Assessment & Plan:   Solitary pulmonary nodule  Right lower lobe lung mass  Centrilobular emphysema (HCC)  Chronic hypoxemic respiratory failure (HCC)  Acute right-sided thoracic back pain  Allergic rhinitis due to other allergic trigger, unspecified seasonality  Discussion: [name]'s overall condition continues to decline in her  shortness of breath is now become severe with minimal exertion.  She also has a new pain in the right upper part of her back which is due to the lung mass which is suppressing that part of her lung.  She has benefited from hospice over the last several months and I think she should continue with this level of care.  Plan: Right lung mass: As we discussed today this is increasing and causing the pain in your back For now continue tramadol as you are doing I would use ibuprofen 400 mg 2-3 times a day instead of Tylenol I think it is entirely appropriate for you to transition from tramadol to morphine however, this can be coordinated with your hospice team  Severe COPD: Continue DuoNeb 4 times a day  Severe shortness of breath: Consider starting using morphine under the direction of the hospice team  Chronic respiratory failure with hypoxemia: Keep using 3 L of oxygen continuously  Allergic rhinitis with postnasal drip and mucus in your throat: Take over-the-counter Zyrtec daily Take over-the-counter fluticasone 2 sprays each nostril daily Use saline rinses in your sinuses twice a day, I recommend NeilMed rinses  We will see you back in January 2020 or sooner if needed  Greater than 50% of this 25-minute visit was spent face-to-face   Current Outpatient Medications:  .  acyclovir (ZOVIRAX) 800 MG tablet, Take 800 mg by mouth at bedtime., Disp: , Rfl:  .  albuterol (PROVENTIL HFA;VENTOLIN HFA) 108 (90 Base) MCG/ACT inhaler, Inhale 2 puffs into the lungs every 6 (six) hours as needed for wheezing or shortness of breath., Disp: 1 Inhaler, Rfl: 2 .  Calcium Carbonate-Vitamin D (CALCIUM 600+D) 600-200 MG-UNIT TABS, Take 1 tablet by mouth every morning. , Disp: , Rfl:  .  clorazepate (TRANXENE) 7.5 MG tablet, Take 7.5 mg by mouth at bedtime as needed for anxiety., Disp: , Rfl:  .  DILT-XR 120 MG 24 hr capsule, Take 120 mg by mouth at bedtime., Disp: , Rfl:  .  diltiazem (DILT-XR) 240 MG 24  hr capsule, Take 1 capsule (240 mg total) by mouth daily. Please keep upcoming appt for future refills. Thank you, Disp: 30 capsule, Rfl: 0 .  escitalopram (LEXAPRO) 5 MG tablet, Take 5 mg by mouth as directed., Disp: , Rfl:  .  fluticasone (FLONASE) 50 MCG/ACT nasal spray, Place 2 sprays into both nostrils as needed for allergies or rhinitis. , Disp: , Rfl: 6 .  gabapentin (NEURONTIN) 300 MG capsule, , Disp: , Rfl:  .  guaiFENesin (MUCINEX) 600 MG 12 hr tablet, Take 1 tablet (600 mg total) by mouth 2 (two) times daily., Disp: 60 tablet, Rfl: 3 .  ipratropium (ATROVENT) 0.03 % nasal spray, Place 2 sprays into both nostrils every 12 (twelve) hours., Disp: 30 mL, Rfl:  12 .  ipratropium-albuterol (DUONEB) 0.5-2.5 (3) MG/3ML SOLN, Use three to four times daily., Disp: 360 mL, Rfl: 2 .  irbesartan (AVAPRO) 150 MG tablet, Take 150 mg by mouth daily., Disp: , Rfl:  .  lidocaine (LIDODERM) 5 %, Place 1 patch onto the skin daily. Remove & Discard patch within 12 hours or as directed by MD, Disp: , Rfl:  .  loratadine (CLARITIN) 10 MG tablet, Take 10 mg by mouth daily as needed for allergies., Disp: , Rfl:  .  metoprolol tartrate (LOPRESSOR) 25 MG tablet, Take 25 mg by mouth 2 (two) times daily., Disp: , Rfl:  .  mirtazapine (REMERON) 15 MG tablet, Take 7.5 mg by mouth at bedtime., Disp: , Rfl:  .  Multiple Vitamin (MULTIVITAMIN WITH MINERALS) TABS tablet, Take 1 tablet by mouth daily., Disp: , Rfl:  .  naproxen (NAPROSYN) 500 MG tablet, Take 500 mg by mouth 2 (two) times daily as needed for pain., Disp: , Rfl: 0 .  OXYGEN, Inhale 3 L into the lungs daily., Disp: , Rfl:  .  polyethylene glycol (MIRALAX / GLYCOLAX) packet, Take 17 g by mouth daily., Disp: , Rfl:  .  pravastatin (PRAVACHOL) 20 MG tablet, Take 20 mg by mouth every morning. , Disp: , Rfl:  .  traMADol (ULTRAM) 50 MG tablet, Take 50 mg by mouth 2 (two) times daily as needed for moderate pain. , Disp: , Rfl:

## 2018-05-29 NOTE — Telephone Encounter (Signed)
Spoke with Rachel Walters with Hospice- she is needing the ov notes and any labs and or Xray's from today 05/29/18. She is aware that they apt is for 4:30pm today and we will fax these things to [phone].  Sending to Burman Nieves and BQ as a fyi.

## 2018-05-29 NOTE — Addendum Note (Signed)
Addended by: Dolores Lory on: 05/29/2018 05:11 PM   Modules accepted: Orders

## 2018-05-30 NOTE — Telephone Encounter (Signed)
CXR has been faxed to Hospice.  I have spoken to [facility] w/hospice and made her aware. Pt has also been made aware of below results. Nothing further is needed.    Juanito Doom, MD  Dolores Lory, RN        BJ,  Please let the patient know this showed similar changes as seen on the recent CT chest, nothing new.  Thanks,  B

## 2018-06-09 ENCOUNTER — Ambulatory Visit: Payer: Medicare Other | Admitting: Pulmonary Disease

## 2018-08-11 ENCOUNTER — Ambulatory Visit: Payer: Medicare Other | Admitting: Pulmonary Disease

## 2018-10-02 ENCOUNTER — Ambulatory Visit: Payer: Medicare Other | Admitting: Pulmonary Disease

## 2018-11-21 ENCOUNTER — Ambulatory Visit: Payer: Medicare Other | Admitting: Pulmonary Disease

## 2018-12-06 ENCOUNTER — Ambulatory Visit: Payer: Medicare Other | Admitting: Primary Care

## 2019-02-09 ENCOUNTER — Telehealth: Payer: Self-pay | Admitting: Cardiology

## 2019-02-09 NOTE — Telephone Encounter (Signed)
error 

## 2019-02-12 ENCOUNTER — Ambulatory Visit: Payer: Medicare Other | Admitting: Cardiology

## 2019-06-25 ENCOUNTER — Ambulatory Visit: Payer: Medicare Other | Admitting: Cardiovascular Disease

## 2019-06-25 ENCOUNTER — Telehealth: Payer: Self-pay

## 2019-06-25 NOTE — Telephone Encounter (Signed)
The patient cancelled her visit with Dr. Burt Knack today due to COVID exposure. Scheduled the patient for virtual visit with Dr. Burt Knack this Thursday. Consent obtained.     Virtual Visit Pre-Appointment Phone Call Confirm consent - "In the setting of the current Covid19 crisis, you are scheduled for a (phone or video) visit with your provider on (date) at (time).  Just as we do with many in-office visits, in order for you to participate in this visit, we must obtain consent.  If you'd like, I can send this to your mychart (if signed up) or email for you to review.  Otherwise, I can obtain your verbal consent now.  All virtual visits are billed to your insurance company just like a normal visit would be.  By agreeing to a virtual visit, we'd like you to understand that the technology does not allow for your provider to perform an examination, and thus may limit your provider's ability to fully assess your condition. If your provider identifies any concerns that need to be evaluated in person, we will make arrangements to do so.  Finally, though the technology is pretty good, we cannot assure that it will always work on either your or our end, and in the setting of a video visit, we may have to convert it to a phone-only visit.  In either situation, we cannot ensure that we have a secure connection.  Are you willing to proceed?" STAFF: Did the patient verbally acknowledge consent to telehealth visit? Document YES/NO here: YES   TELEPHONE CALL NOTE  Rachel Walters has been deemed a candidate for a follow-up tele-health visit to limit community exposure during the Covid-19 pandemic. I spoke with the patient via phone to ensure availability of phone/video source, confirm preferred email & phone number, and discuss instructions and expectations.  I reminded Rachel Walters to be prepared with any vital sign and/or heart rhythm information that could potentially be obtained via home monitoring, at the time of her  visit. I reminded Rachel Walters to expect a phone call prior to her visit.     FULL LENGTH CONSENT FOR TELE-HEALTH VISIT   I hereby voluntarily request, consent and authorize [facility] and its employed or contracted physicians, physician assistants, nurse practitioners or other licensed health care professionals (the Practitioner), to provide me with telemedicine health care services (the Services") as deemed necessary by the treating Practitioner. I acknowledge and consent to receive the Services by the Practitioner via telemedicine. I understand that the telemedicine visit will involve communicating with the Practitioner through live audiovisual communication technology and the disclosure of certain medical information by electronic transmission. I acknowledge that I have been given the opportunity to request an in-person assessment or other available alternative prior to the telemedicine visit and am voluntarily participating in the telemedicine visit.  I understand that I have the right to withhold or withdraw my consent to the use of telemedicine in the course of my care at any time, without affecting my right to future care or treatment, and that the Practitioner or I may terminate the telemedicine visit at any time. I understand that I have the right to inspect all information obtained and/or recorded in the course of the telemedicine visit and may receive copies of available information for a reasonable fee.  I understand that some of the potential risks of receiving the Services via telemedicine include:   Delay or interruption in medical evaluation due to technological equipment failure or disruption;  Information  transmitted may not be sufficient (e.g. poor resolution of images) to allow for appropriate medical decision making by the Practitioner; and/or   In rare instances, security protocols could fail, causing a breach of personal health information.  Furthermore, I acknowledge  that it is my responsibility to provide information about my medical history, conditions and care that is complete and accurate to the best of my ability. I acknowledge that Practitioner's advice, recommendations, and/or decision may be based on factors not within their control, such as incomplete or inaccurate data provided by me or distortions of diagnostic images or specimens that may result from electronic transmissions. I understand that the practice of medicine is not an exact science and that Practitioner makes no warranties or guarantees regarding treatment outcomes. I acknowledge that I will receive a copy of this consent concurrently upon execution via email to the email address I last provided but may also request a printed copy by calling the office of [facility].    I understand that my insurance will be billed for this visit.   I have read or had this consent read to me.  I understand the contents of this consent, which adequately explains the benefits and risks of the Services being provided via telemedicine.   I have been provided ample opportunity to ask questions regarding this consent and the Services and have had my questions answered to my satisfaction.  I give my informed consent for the services to be provided through the use of telemedicine in my medical care  By participating in this telemedicine visit I agree to the above.

## 2019-06-28 ENCOUNTER — Other Ambulatory Visit: Payer: Self-pay

## 2019-06-28 ENCOUNTER — Telehealth: Payer: Medicare Other | Admitting: Cardiovascular Disease

## 2019-09-18 ENCOUNTER — Encounter (HOSPITAL_COMMUNITY): Payer: Self-pay | Admitting: Emergency Medicine

## 2019-09-18 ENCOUNTER — Emergency Department (HOSPITAL_COMMUNITY)
Admission: EM | Admit: 2019-09-18 | Discharge: 2019-09-20 | Disposition: A | Discharge status: Home / Self Care | Attending: Emergency Medicine | Admitting: Emergency Medicine

## 2019-09-18 ENCOUNTER — Emergency Department (HOSPITAL_COMMUNITY)

## 2019-09-18 DIAGNOSIS — R0600 Dyspnea, unspecified: Secondary | ICD-10-CM | POA: Diagnosis present

## 2019-09-18 DIAGNOSIS — J449 Chronic obstructive pulmonary disease, unspecified: Secondary | ICD-10-CM | POA: Diagnosis not present

## 2019-09-18 DIAGNOSIS — Z87891 Personal history of nicotine dependence: Secondary | ICD-10-CM | POA: Diagnosis not present

## 2019-09-18 DIAGNOSIS — Z85118 Personal history of other malignant neoplasm of bronchus and lung: Secondary | ICD-10-CM | POA: Diagnosis not present

## 2019-09-18 DIAGNOSIS — I10 Essential (primary) hypertension: Secondary | ICD-10-CM | POA: Diagnosis not present

## 2019-09-18 DIAGNOSIS — Z79899 Other long term (current) drug therapy: Secondary | ICD-10-CM | POA: Diagnosis not present

## 2019-09-18 DIAGNOSIS — R531 Weakness: Secondary | ICD-10-CM

## 2019-09-18 DIAGNOSIS — U071 COVID-19: Secondary | ICD-10-CM | POA: Insufficient documentation

## 2019-09-18 LAB — COMPREHENSIVE METABOLIC PANEL
ALT: 15 U/L (ref 0–44)
AST: 17 U/L (ref 15–41)
Albumin: 3.6 g/dL (ref 3.5–5.0)
Alkaline Phosphatase: 42 U/L (ref 38–126)
Anion gap: 9 (ref 5–15)
BUN: 21 mg/dL (ref 8–23)
CO2: 29 mmol/L (ref 22–32)
Calcium: 9 mg/dL (ref 8.9–10.3)
Chloride: 101 mmol/L (ref 98–111)
Creatinine, Ser: 0.85 mg/dL (ref 0.44–1.00)
GFR calc Af Amer: >60 mL/min (ref >60)
GFR calc non Af Amer: 60 mL/min — ABNORMAL LOW (ref >60)
Glucose, Bld: 98 mg/dL (ref 70–99)
Potassium: 4.3 mmol/L (ref 3.5–5.1)
Sodium: 139 mmol/L (ref 135–145)
Total Bilirubin: 0.8 mg/dL (ref 0.3–1.2)
Total Protein: 5.8 g/dL — ABNORMAL LOW (ref 6.5–8.1)

## 2019-09-18 LAB — CBC WITH DIFFERENTIAL/PLATELET
Abs Immature Granulocytes: 0.03 10*3/uL (ref 0.00–0.07)
Basophils Absolute: 0 10*3/uL (ref 0.0–0.1)
Basophils Relative: 0 %
Eosinophils Absolute: 0.1 10*3/uL (ref 0.0–0.5)
Eosinophils Relative: 2 %
HCT: 38.9 % (ref 36.0–46.0)
Hemoglobin: 12.9 g/dL (ref 12.0–15.0)
Immature Granulocytes: 1 %
Lymphocytes Relative: 9 %
Lymphs Abs: 0.4 10*3/uL — ABNORMAL LOW (ref 0.7–4.0)
MCH: 31.2 pg (ref 26.0–34.0)
MCHC: 33.2 g/dL (ref 30.0–36.0)
MCV: 94.2 fL (ref 80.0–100.0)
Monocytes Absolute: 0.6 10*3/uL (ref 0.1–1.0)
Monocytes Relative: 12 %
Neutro Abs: 3.7 10*3/uL (ref 1.7–7.7)
Neutrophils Relative %: 76 %
Platelets: 167 10*3/uL (ref 150–400)
RBC: 4.13 MIL/uL (ref 3.87–5.11)
RDW: 11.9 % (ref 11.5–15.5)
WBC: 4.8 10*3/uL (ref 4.0–10.5)
nRBC: 0 % (ref 0.0–0.2)

## 2019-09-18 LAB — BRAIN NATRIURETIC PEPTIDE: B Natriuretic Peptide: 66.9 pg/mL (ref 0.0–100.0)

## 2019-09-18 LAB — POC SARS CORONAVIRUS 2 AG -  ED: SARS Coronavirus 2 Ag: POSITIVE — AB

## 2019-09-18 MED ORDER — TRAMADOL HCL 50 MG PO TABS
50.0000 mg | ORAL_TABLET | Freq: Two times a day (BID) | ORAL | Status: DC | PRN
  Administered 2019-09-18 – 2019-09-19 (×2): 50 mg via ORAL
  Filled 2019-09-18 (×2): qty 1

## 2019-09-18 MED ORDER — CLORAZEPATE DIPOTASSIUM 3.75 MG PO TABS
7.5000 mg | ORAL_TABLET | Freq: Every evening | ORAL | Status: DC | PRN
  Administered 2019-09-19: 7.5 mg via ORAL
  Filled 2019-09-18: qty 1

## 2019-09-18 MED ORDER — PRAVASTATIN SODIUM 10 MG PO TABS
20.0000 mg | ORAL_TABLET | Freq: Every morning | ORAL | Status: DC
  Administered 2019-09-19 – 2019-09-20 (×2): 20 mg via ORAL
  Filled 2019-09-18 (×2): qty 2

## 2019-09-18 MED ORDER — CALCIUM CARBONATE-VITAMIN D 500-200 MG-UNIT PO TABS
1.0000 | ORAL_TABLET | Freq: Every morning | ORAL | Status: DC
  Administered 2019-09-19 – 2019-09-20 (×2): 1 via ORAL
  Filled 2019-09-18 (×2): qty 1

## 2019-09-18 MED ORDER — ADULT MULTIVITAMIN W/MINERALS CH
1.0000 | ORAL_TABLET | Freq: Every day | ORAL | Status: DC
  Administered 2019-09-18 – 2019-09-20 (×3): 1 via ORAL
  Filled 2019-09-18 (×3): qty 1

## 2019-09-18 MED ORDER — METOPROLOL TARTRATE 25 MG PO TABS
25.0000 mg | ORAL_TABLET | Freq: Two times a day (BID) | ORAL | Status: DC
  Administered 2019-09-18 – 2019-09-20 (×3): 25 mg via ORAL
  Filled 2019-09-18 (×3): qty 1

## 2019-09-18 MED ORDER — DILTIAZEM HCL ER COATED BEADS 120 MG PO CP24
120.0000 mg | ORAL_CAPSULE | Freq: Every day | ORAL | Status: DC
  Administered 2019-09-18: 120 mg via ORAL
  Filled 2019-09-18 (×3): qty 1

## 2019-09-18 MED ORDER — IPRATROPIUM BROMIDE 0.03 % NA SOLN: 2.0000 | Freq: Two times a day (BID) | NASAL | Status: DC

## 2019-09-18 MED ORDER — DILTIAZEM HCL ER 240 MG PO CP24: 240.0000 mg | ORAL_CAPSULE | Freq: Every day | ORAL | Status: DC

## 2019-09-18 MED ORDER — FLUTICASONE PROPIONATE 50 MCG/ACT NA SUSP
2.0000 | NASAL | Status: DC | PRN
  Filled 2019-09-18: qty 16

## 2019-09-18 MED ORDER — MIRTAZAPINE 15 MG PO TABS
7.5000 mg | ORAL_TABLET | Freq: Every day | ORAL | Status: DC
  Administered 2019-09-18: 7.5 mg via ORAL
  Filled 2019-09-18 (×2): qty 1

## 2019-09-18 MED ORDER — POLYETHYLENE GLYCOL 3350 17 G PO PACK
17.0000 g | PACK | Freq: Every day | ORAL | Status: DC
  Administered 2019-09-18: 17 g via ORAL
  Filled 2019-09-18 (×2): qty 1

## 2019-09-18 MED ORDER — ACYCLOVIR 400 MG PO TABS
800.0000 mg | ORAL_TABLET | Freq: Every day | ORAL | Status: DC
  Administered 2019-09-19: 800 mg via ORAL
  Filled 2019-09-18: qty 1
  Filled 2019-09-18 (×2): qty 2

## 2019-09-18 MED ORDER — NAPROXEN 250 MG PO TABS
500.0000 mg | ORAL_TABLET | Freq: Two times a day (BID) | ORAL | Status: DC | PRN
  Administered 2019-09-18 – 2019-09-19 (×2): 500 mg via ORAL
  Filled 2019-09-18 (×2): qty 2

## 2019-09-18 MED ORDER — ALBUTEROL SULFATE HFA 108 (90 BASE) MCG/ACT IN AERS
2.0000 | INHALATION_SPRAY | Freq: Four times a day (QID) | RESPIRATORY_TRACT | Status: DC | PRN
Start: 2019-09-18 — End: 2019-09-21
  Administered 2019-09-18: 2 via RESPIRATORY_TRACT
  Filled 2019-09-18: qty 6.7

## 2019-09-18 MED ORDER — ESCITALOPRAM OXALATE 10 MG PO TABS: 5.0000 mg | ORAL_TABLET | ORAL | Status: DC

## 2019-09-18 NOTE — ED Triage Notes (Signed)
Pt here from home for SOB, weakness. Pt's caretaker/grand daughter tested positive  For COVID and pt was last around her on Saturday. Pt is on hospice for mass on lung. Per [facility] pt was on antibiotics for URI and has had increased weakness since, fall last week w/ bruising to lower back. EMS reports crackles in bilateral lungs. Pt's hx of HTN and COPD, on 2L O2 at home. Pt is currently hypertensive, 99% on 4L O2 via [place], AOx4.

## 2019-09-18 NOTE — ED Notes (Signed)
Please call daughter Copeland Gaudreault  @ [phone] a status update--Leslie

## 2019-09-18 NOTE — Care Management (Signed)
ED CM consulted by EDP and [facility] RN. Patient presented from home with SOB and weakness, patient care giver tested positive for covid, patient was tested in the ED and is also Covid+.   Patient is active with [facility].  Spoke with Hospice Nurse she reports that patient lives home alone with granddaughter checking in on her.  Hospice Nurses concerned that may not have the appropriate level of care, and may need higher level of care. Apparently Patient has had 3 falls in the last week.  CM spoke with patient and family concerning options. Informed  family is in quarantine and no one is available to help care for patient at this time. Patient and son Rachel Walters [phone] want to seek SNF placement. CSW will continue to follow up with Transitional care planning.

## 2019-09-18 NOTE — ED Provider Notes (Signed)
[facility] EMERGENCY DEPARTMENT Provider Note   CSN: [id number] Arrival date & time: 09/18/19  1708     History Chief Complaint  Patient presents with  . Shortness of Breath    Rachel Walters is a 86 y.o. female.  86 year old female who presents with concern for possible exposure to Covid.  Patient has a history of COPD and is on chronic oxygen at 3 L.  She is currently a hospice patient  as well.  She is a DNR.  She has not had 2 in crease her oxygen use.  Denies any new chest pain or chest pressure.  States that she has had some increased dyspnea exertion.  No vomiting or diarrhea noted.  No abdominal discomfort.  No treatment use prior to arrival        Past Medical History:  Diagnosis Date  . Arthritis   . Asthmatic bronchitis   . Cancer ([place]) 04/2015   lung cancer   . Cervical dystonia   . Complete uterine prolapse   . COPD (chronic obstructive pulmonary disease) ([place])   . Dyspnea 04/23/2014  . Esophageal dysmotility   . Esophageal reflux 05/19/2012  . H. pylori infection   . Headache(784.0)    sinus  . Hiatal hernia   . History of home oxygen therapy    uses 3 liters oxygen all the time  . Hypertension   . ILD (interstitial lung disease) ([place]) 05/24/2014  . Leg DVT (deep venous thromboembolism), acute ([place]) 09/03/2014   left leg  . Neuromuscular disorder ([place])   . PONV (postoperative nausea and vomiting)    with neck surgery  . Presbyesophagus     Patient Active Problem List   Diagnosis Date Noted  . Palliative care encounter 01/23/2018  . Complete uterine prolapse   . Esophageal spasm   . Right leg DVT ([place]) 09/17/2014  . Multifocal atrial tachycardia ([place]) 09/05/2014  . Leg DVT (deep venous thromboembolism), acute ([place]) 09/03/2014  . Hypotension 09/02/2014  . Sepsis ([place]) 09/02/2014  . SIRS (systemic inflammatory response syndrome) ([place]) 09/02/2014  . Acute on chronic respiratory failure ([place]) 09/02/2014  . Aspiration pneumonia  ([place])   . Pyrexia   . Arterial hypotension   . Pulmonary nodule, right 06/24/2014  . ILD (interstitial lung disease) ([place]) 05/24/2014  . Dyspnea 04/23/2014  . Chronic hypoxemic respiratory failure ([place]) 04/23/2014  . Esophageal reflux 05/19/2012  . Presbyesophagus 05/19/2012  . Dysphagia 05/02/2012  . Fall 07/09/2011  . Multiple fractures of ribs of left side x2 07/09/2011  . COPD (chronic obstructive pulmonary disease) ([place]) 07/09/2011  . Hyperlipidemia 07/09/2011  . Essential hypertension 08/01/2007  . ASTHMATIC BRONCHITIS, ACUTE 08/01/2007  . ALLERGIC RHINITIS, CHRONIC 08/01/2007  . ARTHRITIS 08/01/2007  . HEADACHE, CHRONIC 08/01/2007  . Diverticulosis of large intestine 07/14/2007    Past Surgical History:  Procedure Laterality Date  . BALLOON DILATION  05/19/2012   Procedure: BALLOON DILATION;  Surgeon: Lafayette Dragon, MD;  Location: [facility];  Service: Endoscopy;  Laterality: N/A;  . COLONOSCOPY    . ESOPHAGOGASTRODUODENOSCOPY    . ESOPHAGOGASTRODUODENOSCOPY N/A 10/28/2014   Procedure: ESOPHAGOGASTRODUODENOSCOPY (EGD);  Surgeon: Lafayette Dragon, MD;  Location: [facility];  Service: Endoscopy;  Laterality: N/A;  . HARDWARE REMOVAL Left 12/17/2014   Procedure: REMOVAL PLATE/SCREWS LEFT ELBOW;  Surgeon: Daryll Brod, MD;  Location: [facility];  Service: Orthopedics;  Laterality: Left;  . NECK SURGERY  1992 and 1993   spur removed  . SAVORY DILATION  N/A 10/28/2014   Procedure: SAVORY DILATION;  Surgeon: Lafayette Dragon, MD;  Location: [facility];  Service: Endoscopy;  Laterality: N/A;     OB History    Gravida  3   Para  3   Term  3   Preterm      AB      Living  3     SAB      TAB      Ectopic      Multiple      Live Births  3           Family History  Problem Relation Age of Onset  . Diabetes Mother   . Diabetes Sister   . Diabetes Maternal Aunt   . Breast cancer Sister     Social History   Tobacco Use  . Smoking status:  Former Smoker    Packs/day: 0.50    Years: 50.00    Pack years: 25.00    Types: Cigarettes    Quit date: 07/05/2011    Years since quitting: 8.2  . Smokeless tobacco: Never Used  Substance Use Topics  . Alcohol use: No    Alcohol/week: 0.0 standard drinks  . Drug use: No    Home Medications Prior to Admission medications   Medication Sig Start Date End Date Taking? Authorizing Provider  acyclovir (ZOVIRAX) 800 MG tablet Take 800 mg by mouth at bedtime.    [provider]  albuterol (PROVENTIL HFA;VENTOLIN HFA) 108 (90 Base) MCG/ACT inhaler Inhale 2 puffs into the lungs every 6 (six) hours as needed for wheezing or shortness of breath. 05/17/18   Juanito Doom, MD  Calcium Carbonate-Vitamin D (CALCIUM 600+D) 600-200 MG-UNIT TABS Take 1 tablet by mouth every morning.     [provider]  clorazepate (TRANXENE) 7.5 MG tablet Take 7.5 mg by mouth at bedtime as needed for anxiety.    [provider]  DILT-XR 120 MG 24 hr capsule Take 120 mg by mouth at bedtime. 10/04/17   [provider]  diltiazem (DILT-XR) 240 MG 24 hr capsule Take 1 capsule (240 mg total) by mouth daily. Please keep upcoming appt for future refills. Thank you 10/06/17   Sherren Mocha, MD  escitalopram (LEXAPRO) 5 MG tablet Take 5 mg by mouth as directed. 08/16/17   [provider]  fluticasone (FLONASE) 50 MCG/ACT nasal spray Place 2 sprays into both nostrils as needed for allergies or rhinitis.  10/01/14   [provider]  gabapentin (NEURONTIN) 300 MG capsule  01/10/18   [provider]  guaiFENesin (MUCINEX) 600 MG 12 hr tablet Take 1 tablet (600 mg total) by mouth 2 (two) times daily. 05/17/18   Juanito Doom, MD  ipratropium (ATROVENT) 0.03 % nasal spray Place 2 sprays into both nostrils every 12 (twelve) hours. 03/24/18   Juanito Doom, MD  ipratropium-albuterol (DUONEB) 0.5-2.5 (3) MG/3ML SOLN Use three to four times daily. 05/17/18   Juanito Doom, MD  irbesartan (AVAPRO) 150 MG tablet Take 150 mg by mouth daily.    [provider]  lidocaine (LIDODERM) 5 % Place 1 patch onto the skin daily. Remove & Discard patch within 12 hours or as directed by MD    [provider]  loratadine (CLARITIN) 10 MG tablet Take 10 mg by mouth daily as needed for allergies.    [provider]  metoprolol tartrate (LOPRESSOR) 25 MG tablet Take 25 mg by mouth 2 (two) times daily.  [provider]  mirtazapine (REMERON) 15 MG tablet Take 7.5 mg by mouth at bedtime.    [provider]  Multiple Vitamin (MULTIVITAMIN WITH MINERALS) TABS tablet Take 1 tablet by mouth daily.    [provider]  naproxen (NAPROSYN) 500 MG tablet Take 500 mg by mouth 2 (two) times daily as needed for pain. 07/15/17   [provider]  OXYGEN Inhale 3 L into the lungs daily.    [provider]  polyethylene glycol (MIRALAX / GLYCOLAX) packet Take 17 g by mouth daily.    [provider]  pravastatin (PRAVACHOL) 20 MG tablet Take 20 mg by mouth every morning.  09/29/14   [provider]  traMADol (ULTRAM) 50 MG tablet Take 50 mg by mouth 2 (two) times daily as needed for moderate pain.  09/10/14   [provider]    Allergies    Sulfa antibiotics and Morphine and related  Review of Systems   Review of Systems  All other systems reviewed and are negative.   Physical Exam Updated Vital Signs BP (!) 179/83 (BP Location: Left Arm)   Pulse 100   Temp 98.3 F (36.8 C) (Oral)   Resp 16   Ht 1.651 m (5\' 5" )   Wt 45.4 kg   SpO2 100%   BMI 16.64 kg/m   Physical Exam Vitals and nursing note reviewed.  Constitutional:      General: She is not in acute distress.    Appearance: Normal appearance. She is well-developed. She is not toxic-appearing.  HENT:     Head: Normocephalic and atraumatic.  Eyes:     General: Lids are normal.     Conjunctiva/sclera: Conjunctivae normal.      Pupils: Pupils are equal, round, and reactive to light.  Neck:     Thyroid: No thyroid mass.     Trachea: No tracheal deviation.  Cardiovascular:     Rate and Rhythm: Normal rate and regular rhythm.     Heart sounds: Normal heart sounds. No murmur. No gallop.   Pulmonary:     Effort: Accessory muscle usage and prolonged expiration present. No respiratory distress.     Breath sounds: No stridor. Decreased breath sounds present. No wheezing, rhonchi or rales.  Abdominal:     General: Bowel sounds are normal. There is no distension.     Palpations: Abdomen is soft.     Tenderness: There is no abdominal tenderness. There is no rebound.  Musculoskeletal:        General: No tenderness. Normal range of motion.     Cervical back: Normal range of motion and neck supple.  Skin:    General: Skin is warm and dry.     Findings: No abrasion or rash.  Neurological:     Mental Status: She is alert and oriented to person, place, and time.     GCS: GCS eye subscore is 4. GCS verbal subscore is 5. GCS motor subscore is 6.     Cranial Nerves: No cranial nerve deficit.     Sensory: No sensory deficit.  Psychiatric:        Speech: Speech normal.        Behavior: Behavior normal.     ED Results / Procedures / Treatments   Labs (all labs ordered are listed, but only abnormal results are displayed) Labs Reviewed  CBC WITH DIFFERENTIAL/PLATELET  COMPREHENSIVE METABOLIC PANEL  BRAIN NATRIURETIC PEPTIDE  POC SARS CORONAVIRUS 2 AG -  ED  EKG EKG Interpretation  Date/Time:  Tuesday September 18 2019 17:23:24 EST Ventricular Rate:  97 PR Interval:    QRS Duration: 89 QT Interval:  336 QTC Calculation: 427 R Axis:   10 Text Interpretation: Sinus rhythm Probable anteroseptal infarct, old Nonspecific T abnormalities, lateral leads Baseline wander in lead(s) V6 No STEMI Confirmed by Nanda Quinton [id number]) on 09/18/2019 5:27:50 PM   Radiology No results found.  Procedures Procedures  (including critical care time)  Medications Ordered in ED Medications - No data to display  ED Course  I have reviewed the triage vital signs and the nursing notes.  Pertinent labs & imaging results that were available during my care of the patient were reviewed by me and considered in my medical decision making (see chart for details).    MDM Rules/Calculators/A&P                      Is Covid positive here.  She is otherwise stable.  However due to her social situation she has no one to take care of her.  Has been seen by social work who will find placement for her in a Covid facility nursing home Final Clinical Impression(s) / ED Diagnoses Final diagnoses:  None    Rx / DC Orders ED Discharge Orders    None       Lacretia Leigh, MD 09/18/19 2008

## 2019-09-18 NOTE — TOC Initial Note (Signed)
Transition of Care [facility]) - Initial/Assessment Note    Patient Details  Name: Rachel Walters MRN: [id number] Date of Birth: 1926-05-15  Transition of Care [facility]) CM/SW Contact:    Vergie Living, LCSW Phone Number: 09/18/2019, 9:57 PM  Clinical Narrative:  [facility] team spoke with Pt via phone and observed from outside of the room to maintain Covid + protocols.                 Expected Discharge Plan: Skilled Nursing Facility Barriers to Discharge: Continued Medical Work up   Patient Goals and CMS Choice Patient states their goals for this hospitalization and ongoing recovery are:: Get stronger/feel better      Expected Discharge Plan and Services Expected Discharge Plan: [place]       Living arrangements for the past 2 months: Apartment                                      Prior Living Arrangements/Services Living arrangements for the past 2 months: Apartment Lives with:: Self Patient language and need for interpreter reviewed:: No Do you feel safe going back to the place where you live?: Yes      Need for Family Participation in Patient Care: Yes (Comment)(Family actively participates in care/support) Care giver support system in place?: Yes (comment) Current home services: Hospice Criminal Activity/Legal Involvement Pertinent to Current Situation/Hospitalization: No - Comment as needed  Activities of Daily Living      Permission Sought/Granted Permission sought to share information with : Family Supports Permission granted to share information with : Yes, Verbal Permission Granted  Share Information with NAME: Raeli, Rohlik [phone]        Permission granted to share info w Contact Information: Jayona, Norato [phone]  Emotional Assessment Appearance:: Appears younger than stated age Attitude/Demeanor/Rapport: Engaged Affect (typically observed): Stoic Orientation: : Oriented to Self, Oriented to Place, Oriented to  Time,  Oriented to Situation Alcohol / Substance Use: Not Applicable Psych Involvement: No (comment)  Admission diagnosis:  Covid+ Patient Active Problem List   Diagnosis Date Noted  . Palliative care encounter 01/23/2018  . Complete uterine prolapse   . Esophageal spasm   . Right leg DVT ([place]) 09/17/2014  . Multifocal atrial tachycardia ([place]) 09/05/2014  . Leg DVT (deep venous thromboembolism), acute ([place]) 09/03/2014  . Hypotension 09/02/2014  . Sepsis ([place]) 09/02/2014  . SIRS (systemic inflammatory response syndrome) ([place]) 09/02/2014  . Acute on chronic respiratory failure ([place]) 09/02/2014  . Aspiration pneumonia ([place])   . Pyrexia   . Arterial hypotension   . Pulmonary nodule, right 06/24/2014  . ILD (interstitial lung disease) ([place]) 05/24/2014  . Dyspnea 04/23/2014  . Chronic hypoxemic respiratory failure ([place]) 04/23/2014  . Esophageal reflux 05/19/2012  . Presbyesophagus 05/19/2012  . Dysphagia 05/02/2012  . Fall 07/09/2011  . Multiple fractures of ribs of left side x2 07/09/2011  . COPD (chronic obstructive pulmonary disease) ([place]) 07/09/2011  . Hyperlipidemia 07/09/2011  . Essential hypertension 08/01/2007  . ASTHMATIC BRONCHITIS, ACUTE 08/01/2007  . ALLERGIC RHINITIS, CHRONIC 08/01/2007  . ARTHRITIS 08/01/2007  . HEADACHE, CHRONIC 08/01/2007  . Diverticulosis of large intestine 07/14/2007   PCP:  Prince Solian, MD Pharmacy:   [facility], [place] [address] Phone: [phone] Fax: [place], [place]  [address] [place] [address] Phone: [phone] Fax: [phone]     Social Determinants of Health (SDOH) Interventions    Readmission Risk Interventions No flowsheet data found.

## 2019-09-19 MED ORDER — IPRATROPIUM BROMIDE 0.06 % NA SOLN
1.0000 | Freq: Two times a day (BID) | NASAL | Status: DC
  Administered 2019-09-19 – 2019-09-20 (×2): 1 via NASAL
  Filled 2019-09-19: qty 15

## 2019-09-19 NOTE — Progress Notes (Addendum)
CSW attempted to reach the patient via the room phone but was unsuccessful due to no answer. CSW spoke with patient's son Merry Proud at [phone] to request his permission to begin SNF placement process. Merry Proud states he prefers the [place] area or [facility] preferably.   CSW completed FL2 and faxed patient's clinical information to [facility], [facility], and [facility] for review.  Madilyn Fireman, MSW, LCSW-A Transitions of Care  Clinical Social Worker  [facility] Emergency Departments  Medical ICU [phone]

## 2019-09-19 NOTE — NC FL2 (Signed)
[place] LEVEL OF CARE SCREENING TOOL     IDENTIFICATION  Patient Name: Rachel Walters Birthdate: 1925/09/19 Sex: female Admission Date (Current Location): 09/18/2019  [facility] [place] Number:  Herbalist and Address:  [facility], [place] [address], [place], [place] [address]      Provider Number: [id number]  Attending Physician Name and Address:  Default, Provider, MD  Relative Name and Phone Number:  Anji Terman, [phone]    Current Level of Care: Hospital Recommended Level of Care: [place] Prior Approval Number:    Date Approved/Denied:   PASRR Number: [id number]  Discharge Plan: SNF    Current Diagnoses: Patient Active Problem List   Diagnosis Date Noted  . Palliative care encounter 01/23/2018  . Complete uterine prolapse   . Esophageal spasm   . Right leg DVT ([place]) 09/17/2014  . Multifocal atrial tachycardia ([place]) 09/05/2014  . Leg DVT (deep venous thromboembolism), acute ([place]) 09/03/2014  . Hypotension 09/02/2014  . Sepsis ([place]) 09/02/2014  . SIRS (systemic inflammatory response syndrome) ([place]) 09/02/2014  . Acute on chronic respiratory failure ([place]) 09/02/2014  . Aspiration pneumonia ([place])   . Pyrexia   . Arterial hypotension   . Pulmonary nodule, right 06/24/2014  . ILD (interstitial lung disease) ([place]) 05/24/2014  . Dyspnea 04/23/2014  . Chronic hypoxemic respiratory failure ([place]) 04/23/2014  . Esophageal reflux 05/19/2012  . Presbyesophagus 05/19/2012  . Dysphagia 05/02/2012  . Fall 07/09/2011  . Multiple fractures of ribs of left side x2 07/09/2011  . COPD (chronic obstructive pulmonary disease) ([place]) 07/09/2011  . Hyperlipidemia 07/09/2011  . Essential hypertension 08/01/2007  . ASTHMATIC BRONCHITIS, ACUTE 08/01/2007  . ALLERGIC RHINITIS, CHRONIC 08/01/2007  . ARTHRITIS 08/01/2007  . HEADACHE, CHRONIC 08/01/2007  . Diverticulosis of large intestine 07/14/2007     Orientation RESPIRATION BLADDER Height & Weight     Self, Situation, Time, Place  Normal Incontinent, External catheter Weight: 100 lb (45.4 kg) Height:  5\' 5"  (165.1 cm)  BEHAVIORAL SYMPTOMS/MOOD NEUROLOGICAL BOWEL NUTRITION STATUS      Incontinent Diet  AMBULATORY STATUS COMMUNICATION OF NEEDS Skin   Limited Assist Verbally Normal                       Personal Care Assistance Level of Assistance  Bathing, Dressing, Feeding Bathing Assistance: Limited assistance Feeding assistance: Limited assistance Dressing Assistance: Limited assistance     Functional Limitations Info  Speech, Sight, Hearing Sight Info: Impaired(Patient wears glasses) Hearing Info: Adequate Speech Info: Adequate    SPECIAL CARE FACTORS FREQUENCY  PT (By licensed PT), OT (By licensed OT)     PT Frequency: 5x weekly OT Frequency: 5x weekly            Contractures Contractures Info: Not present    Additional Factors Info  Allergies, Isolation Precautions   Allergies Info: Sulfa Antibiotics, Morphine and Related     Isolation Precautions Info: Patient is COVID positive     Current Medications (09/19/2019):  This is the current hospital active medication list Current Facility-Administered Medications  Medication Dose Route Frequency Provider Last Rate Last Admin  . acyclovir (ZOVIRAX) tablet 800 mg  800 mg Oral QHS Lacretia Leigh, MD      . albuterol (VENTOLIN HFA) 108 (90 Base) MCG/ACT inhaler 2 puff  2 puff Inhalation Q6H PRN Lacretia Leigh, MD   2 puff at 09/18/19 2147  . calcium-vitamin D (OSCAL WITH D) 500-200 MG-UNIT per tablet  1 tablet  1 tablet Oral q morning - 10a Lacretia Leigh, MD   1 tablet at 09/19/19 [phone]  . clorazepate (TRANXENE) tablet 7.5 mg  7.5 mg Oral QHS PRN Lacretia Leigh, MD   7.5 mg at 09/19/19 0409  . diltiazem (CARDIZEM CD) 24 hr capsule 120 mg  120 mg Oral QHS Lacretia Leigh, MD   120 mg at 09/18/19 2144  . escitalopram (LEXAPRO) tablet 5 mg  5 mg Oral UD  Lacretia Leigh, MD      . fluticasone [facility]) 50 MCG/ACT nasal spray 2 spray  2 spray Each Nare PRN Lacretia Leigh, MD      . ipratropium (ATROVENT) 0.06 % nasal spray 1 spray  1 spray Each Nare Q12H Lacretia Leigh, MD   1 spray at 09/19/19 1250  . metoprolol tartrate (LOPRESSOR) tablet 25 mg  25 mg Oral BID Lacretia Leigh, MD   25 mg at 09/19/19 [id number]  . mirtazapine (REMERON) tablet 7.5 mg  7.5 mg Oral QHS Lacretia Leigh, MD   7.5 mg at 09/18/19 2144  . multivitamin with minerals tablet 1 tablet  1 tablet Oral Daily Lacretia Leigh, MD   1 tablet at 09/19/19 [id number]  . naproxen (NAPROSYN) tablet 500 mg  500 mg Oral BID PRN Lacretia Leigh, MD   500 mg at 09/18/19 2337  . polyethylene glycol (MIRALAX / GLYCOLAX) packet 17 g  17 g Oral Daily Lacretia Leigh, MD   Stopped at 09/19/19 [phone]  . pravastatin (PRAVACHOL) tablet 20 mg  20 mg Oral q morning - 10a Lacretia Leigh, MD   20 mg at 09/19/19 [id number]  . traMADol (ULTRAM) tablet 50 mg  50 mg Oral BID PRN Lacretia Leigh, MD   50 mg at 09/19/19 [id number]   Current Outpatient Medications  Medication Sig Dispense Refill  . acyclovir (ZOVIRAX) 800 MG tablet Take 800 mg by mouth at bedtime.    [place] Kitchen albuterol (PROVENTIL HFA;VENTOLIN HFA) 108 (90 Base) MCG/ACT inhaler Inhale 2 puffs into the lungs every 6 (six) hours as needed for wheezing or shortness of breath. 1 Inhaler 2  . Calcium Carbonate-Vitamin D (CALCIUM 600+D) 600-200 MG-UNIT TABS Take 1 tablet by mouth every morning.     . clorazepate (TRANXENE) 7.5 MG tablet Take 7.5 mg by mouth at bedtime as needed for anxiety.    Marland Kitchen DILT-XR 120 MG 24 hr capsule Take 120 mg by mouth at bedtime.    Marland Kitchen diltiazem (DILT-XR) 240 MG 24 hr capsule Take 1 capsule (240 mg total) by mouth daily. Please keep upcoming appt for future refills. Thank you 30 capsule 0  . escitalopram (LEXAPRO) 5 MG tablet Take 5 mg by mouth as directed.    . fluticasone (FLONASE) 50 MCG/ACT nasal spray Place 2 sprays into both nostrils as needed for  allergies or rhinitis.   6  . gabapentin (NEURONTIN) 300 MG capsule     . guaiFENesin (MUCINEX) 600 MG 12 hr tablet Take 1 tablet (600 mg total) by mouth 2 (two) times daily. 60 tablet 3  . ipratropium (ATROVENT) 0.03 % nasal spray Place 2 sprays into both nostrils every 12 (twelve) hours. 30 mL 12  . ipratropium-albuterol (DUONEB) 0.5-2.5 (3) MG/3ML SOLN Use three to four times daily. 360 mL 2  . irbesartan (AVAPRO) 150 MG tablet Take 150 mg by mouth daily.    Marland Kitchen lidocaine (LIDODERM) 5 % Place 1 patch onto the skin daily. Remove & Discard patch within 12 hours or as directed by MD    .  loratadine (CLARITIN) 10 MG tablet Take 10 mg by mouth daily as needed for allergies.    . metoprolol tartrate (LOPRESSOR) 25 MG tablet Take 25 mg by mouth 2 (two) times daily.    . mirtazapine (REMERON) 15 MG tablet Take 7.5 mg by mouth at bedtime.    . Multiple Vitamin (MULTIVITAMIN WITH MINERALS) TABS tablet Take 1 tablet by mouth daily.    . naproxen (NAPROSYN) 500 MG tablet Take 500 mg by mouth 2 (two) times daily as needed for pain.  0  . OXYGEN Inhale 3 L into the lungs daily.    . polyethylene glycol (MIRALAX / GLYCOLAX) packet Take 17 g by mouth daily.    . pravastatin (PRAVACHOL) 20 MG tablet Take 20 mg by mouth every morning.     . traMADol (ULTRAM) 50 MG tablet Take 50 mg by mouth 2 (two) times daily as needed for moderate pain.        Discharge Medications: Please see discharge summary for a list of discharge medications.  Relevant Imaging Results:  Relevant Lab Results:   Additional Information SSN: [id number], COVID positive  Archie Endo, LCSW

## 2019-09-19 NOTE — Evaluation (Signed)
Physical Therapy Evaluation Patient Details Name: Rachel Walters MRN: [id number] DOB: [birth year] Today's Date: 09/19/2019   History of Present Illness  Pt is a 86 y/o female presenting secondary to [place] exposure and fall at home. Pt tested positive for COVID. Pt is active with hospice secondary to lung mass. PMH includes COPD on 3L and HTN.   Clinical Impression  Pt presenting with problem above and deficits below. Pt with increased pain in rib area. Required min A for mobility tasks. Pt currently lives alone and has had a fall at home. Given increased risk for falls and no assist at home, feel pt would benefit from SNF level therapies. Will continue to follow acutely to maximize functional mobility independence and safety.     Follow Up Recommendations SNF    Equipment Recommendations  None recommended by PT    Recommendations for Other Services       Precautions / Restrictions Precautions Precautions: Fall Restrictions Weight Bearing Restrictions: No      Mobility  Bed Mobility Overal bed mobility: Needs Assistance Bed Mobility: Supine to Sit;Sit to Supine     Supine to sit: Min assist Sit to supine: Min assist   General bed mobility comments: Min A for trunk assist and LE assist.   Transfers Overall transfer level: Needs assistance Equipment used: 1 person hand held assist Transfers: Sit to/from Stand Sit to Stand: Min assist         General transfer comment: Min a for lift assist and steadying.   Ambulation/Gait Ambulation/Gait assistance: Min assist   Assistive device: 1 person hand held assist       General Gait Details: took side steps at EOB this session. Min A for steadying assist.   Stairs            Wheelchair Mobility    Modified Rankin (Stroke Patients Only)       Balance Overall balance assessment: Needs assistance Sitting-balance support: No upper extremity supported;Feet supported Sitting balance-Leahy Scale: Fair      Standing balance support: Single extremity supported;During functional activity Standing balance-Leahy Scale: Poor Standing balance comment: Reliant on BUE and external support                              Pertinent Vitals/Pain Pain Assessment: Faces Faces Pain Scale: Hurts even more Pain Location: ribs  Pain Descriptors / Indicators: Aching;Guarding;Grimacing Pain Intervention(s): Limited activity within patient's tolerance;Monitored during session;Repositioned    Home Living Family/patient expects to be discharged to:: Skilled nursing facility                 Additional Comments: Pt from home alone and pt's caregiver tested + for covid, so does not have support at home.     Prior Function Level of Independence: Needs assistance   Gait / Transfers Assistance Needed: Pt reports using RW or cane for mobility.   ADL's / Homemaking Assistance Needed: Reports hospice staff assisted with ADLs.         Hand Dominance        Extremity/Trunk Assessment   Upper Extremity Assessment Upper Extremity Assessment: Generalized weakness    Lower Extremity Assessment Lower Extremity Assessment: Generalized weakness    Cervical / Trunk Assessment Cervical / Trunk Assessment: Kyphotic  Communication   Communication: No difficulties  Cognition Arousal/Alertness: Awake/alert Behavior During Therapy: WFL for tasks assessed/performed Overall Cognitive Status: No family/caregiver present to determine baseline cognitive functioning  General Comments: Pt asking if she was in a "little neighborhood". Reoriented to place.       General Comments General comments (skin integrity, edema, etc.): Oxygen sats at 90-94% on 3L during mobility tasks.     Exercises     Assessment/Plan    PT Assessment Patient needs continued PT services  PT Problem List Cardiopulmonary status limiting activity;Decreased strength;Decreased  activity tolerance;Decreased balance;Decreased mobility;Decreased knowledge of use of DME;Decreased knowledge of precautions       PT Treatment Interventions Gait training;DME instruction;Functional mobility training;Therapeutic activities;Therapeutic exercise;Balance training;Patient/family education    PT Goals (Current goals can be found in the Care Plan section)  Acute Rehab PT Goals Patient Stated Goal: to get stronger PT Goal Formulation: With patient Time For Goal Achievement: 10/03/19 Potential to Achieve Goals: Fair    Frequency Min 2X/week   Barriers to discharge Decreased caregiver support      Co-evaluation               AM-PAC PT "6 Clicks" Mobility  Outcome Measure Help needed turning from your back to your side while in a flat bed without using bedrails?: A Little Help needed moving from lying on your back to sitting on the side of a flat bed without using bedrails?: A Little Help needed moving to and from a bed to a chair (including a wheelchair)?: A Little Help needed standing up from a chair using your arms (e.g., wheelchair or bedside chair)?: A Little Help needed to walk in hospital room?: A Lot Help needed climbing 3-5 steps with a railing? : A Lot 6 Click Score: 16    End of Session Equipment Utilized During Treatment: Gait belt;Oxygen Activity Tolerance: Patient tolerated treatment well Patient left: in bed;with call bell/phone within reach Nurse Communication: Mobility status PT Visit Diagnosis: Other abnormalities of gait and mobility (R26.89);Muscle weakness (generalized) (M62.81);History of falling (Z91.81);Repeated falls (R29.6)    Time: [id number] PT Time Calculation (min) (ACUTE ONLY): 25 min   Charges:   PT Evaluation $PT Eval Moderate Complexity: 1 Mod PT Treatments $Therapeutic Activity: 8-22 mins        Lou Miner, DPT  Acute Rehabilitation Services  Pager: [phone] Office: [phone]   Rudean Hitt 09/19/2019, 12:12 PM

## 2019-09-19 NOTE — ED Notes (Signed)
Lunch Tray Ordered @ 1049. °

## 2019-09-19 NOTE — Progress Notes (Signed)
Hydrologist [facility])  Hospital Liaison: RN note     This is an [facility] home care hospice patient.  [facility] hospital liaison will follow during hospital stay for anticipated discharge to resume hospice services at home.  Please call with any hospice related questions or concerns.  Please use [facility] for all active [facility] patients if ambulance transport is needed.  Thank you, Farrel Gordon, RN, CCM   [place] (listed on [facility] under Hospice and [facility] [place])   [phone]

## 2019-09-20 NOTE — ED Notes (Signed)
Per chart alert I notified Hospice that pt is here. Also called and ordered pt dinner tray.

## 2019-09-20 NOTE — Progress Notes (Addendum)
2pm: CSW spoke with patient via phone to update her on her discharge plan. Patient stated understanding and agreement to plan, CSW will update patient once authorization is obtained.  12pm: CSW initiated insurance authorization for this patient.  CSW spoke with Jackelyn Poling, who presented patient's son with the bed offer from [facility] and he is agreeable.  11am: CSW faxed patient's positive COVID to [facility].   10am: CSW spoke with Olivia Mackie at [facility] who states she will complete an eligibility check for this patient. Olivia Mackie is also determining bed availability.   CSW reached out to [facility] [place] to request a review.  9am: CSW spoke with Sheree at [facility] in [place] - she states the facility in can offer the patient a bed.   CSW spoke with Jackelyn Poling, Education officer, museum at [employer] who agreed to speak with the patient's son about revoking the patient's hospice benefits. CSW informed Jackelyn Poling that the patient is fully alert and oriented.   CSW spoke with patient's son Merry Proud to update him on the tentative discharge plan. Merry Proud was unwilling to accept the bed offer from the [place].   Madilyn Fireman, MSW, LCSW-A Transitions of Care  Clinical Social Worker  [facility] Emergency Departments  Medical ICU [phone]

## 2019-09-20 NOTE — ED Notes (Signed)
Pt has finished her meal tray and has been safely transported to EMS stretcher. IV discontinued, purewick removed.

## 2019-09-20 NOTE — ED Notes (Signed)
Placement  Breakfast ordered  

## 2019-09-20 NOTE — ED Notes (Signed)
Pt helped with lunch tray.  Pt is sitting up at the side of the bed per her request and eating lunch, fresh ice water given with lunch. Offered pt to change her sheets after but pt tells me that this was already done this am.

## 2019-09-20 NOTE — ED Notes (Signed)
Pt feels that she has not been updated or included in the decision making regarding SNF placement. CSW notified who advises that they had tried to reach her by phone yesterday and will attempt again now.

## 2019-09-20 NOTE — Discharge Instructions (Addendum)
Monitor for worsening respiratory status.  Return to the emergency room with any worsening breathing or low oxygen. Treat symptomatically, use Tylenol as needed for fever or aches.

## 2019-09-20 NOTE — Progress Notes (Addendum)
CSW called Rachel Walters in an effort to facilitate paperwork to transfer Pt from hospice to SNF for billing purposes. CSW left HIPPA compliant voicemail.   CSW called Pts son to update about transfer.

## 2019-09-20 NOTE — ED Notes (Signed)
[facility] arrived for transport. Pt sitting up in bed eating dinner tray.

## 2019-09-20 NOTE — Progress Notes (Addendum)
[employer] for report. [phone]

## 2019-09-20 NOTE — ED Notes (Signed)
[facility] called @ 1613-per Heidi, RN called by Levada Dy

## 2019-10-05 ENCOUNTER — Ambulatory Visit: Payer: Medicare Other | Admitting: Cardiovascular Disease

## 2019-11-05 ENCOUNTER — Ambulatory Visit: Payer: Medicare Other | Admitting: Cardiovascular Disease

## 2019-12-18 ENCOUNTER — Encounter: Payer: Self-pay | Admitting: Cardiovascular Disease

## 2020-06-25 DEATH — deceased
